# Patient Record
Sex: Female | Born: 1960 | Race: White | Hispanic: Yes | Marital: Married | Smoking: Never smoker
Health system: Southern US, Community
[De-identification: ages and names within clinical notes are randomized; demographics above are authoritative.]

## PROBLEM LIST (undated history)

## (undated) DIAGNOSIS — J069 Acute upper respiratory infection, unspecified: Secondary | ICD-10-CM

## (undated) DIAGNOSIS — F419 Anxiety disorder, unspecified: Secondary | ICD-10-CM

## (undated) DIAGNOSIS — K219 Gastro-esophageal reflux disease without esophagitis: Secondary | ICD-10-CM

## (undated) DIAGNOSIS — K6389 Other specified diseases of intestine: Secondary | ICD-10-CM

## (undated) DIAGNOSIS — F32A Depression, unspecified: Secondary | ICD-10-CM

## (undated) DIAGNOSIS — Z9221 Personal history of antineoplastic chemotherapy: Secondary | ICD-10-CM

## (undated) DIAGNOSIS — C50919 Malignant neoplasm of unspecified site of unspecified female breast: Secondary | ICD-10-CM

## (undated) DIAGNOSIS — F329 Major depressive disorder, single episode, unspecified: Secondary | ICD-10-CM

## (undated) DIAGNOSIS — Z1371 Encounter for nonprocreative screening for genetic disease carrier status: Secondary | ICD-10-CM

## (undated) HISTORY — DX: Other specified diseases of intestine: K63.89

## (undated) HISTORY — DX: Encounter for nonprocreative screening for genetic disease carrier status: Z13.71

## (undated) HISTORY — DX: Malignant neoplasm of unspecified site of unspecified female breast: C50.919

## (undated) HISTORY — PX: APPENDECTOMY: SHX54

---

## 2002-06-27 ENCOUNTER — Encounter: Payer: Self-pay | Admitting: Family Medicine

## 2002-06-27 ENCOUNTER — Ambulatory Visit (HOSPITAL_COMMUNITY): Admission: RE | Admit: 2002-06-27 | Discharge: 2002-06-27 | Payer: Self-pay | Admitting: Family Medicine

## 2002-08-12 ENCOUNTER — Encounter: Payer: Self-pay | Admitting: Family Medicine

## 2002-08-12 ENCOUNTER — Ambulatory Visit (HOSPITAL_COMMUNITY): Admission: RE | Admit: 2002-08-12 | Discharge: 2002-08-12 | Payer: Self-pay | Admitting: Family Medicine

## 2003-06-29 ENCOUNTER — Ambulatory Visit (HOSPITAL_COMMUNITY): Admission: RE | Admit: 2003-06-29 | Discharge: 2003-06-29 | Payer: Self-pay | Admitting: Family Medicine

## 2003-10-10 ENCOUNTER — Other Ambulatory Visit: Admission: RE | Admit: 2003-10-10 | Discharge: 2003-10-10 | Payer: Self-pay | Admitting: Family Medicine

## 2004-06-16 HISTORY — PX: TUBAL LIGATION: SHX77

## 2004-06-16 HISTORY — PX: ENDOMETRIAL ABLATION: SHX621

## 2004-07-19 ENCOUNTER — Ambulatory Visit (HOSPITAL_COMMUNITY): Admission: RE | Admit: 2004-07-19 | Discharge: 2004-07-19 | Payer: Self-pay | Admitting: Family Medicine

## 2004-10-24 ENCOUNTER — Ambulatory Visit (HOSPITAL_COMMUNITY): Admission: RE | Admit: 2004-10-24 | Discharge: 2004-10-24 | Payer: Self-pay | Admitting: Family Medicine

## 2004-10-30 ENCOUNTER — Other Ambulatory Visit: Admission: RE | Admit: 2004-10-30 | Discharge: 2004-10-30 | Payer: Self-pay | Admitting: Family Medicine

## 2005-07-28 ENCOUNTER — Ambulatory Visit (HOSPITAL_COMMUNITY): Admission: RE | Admit: 2005-07-28 | Discharge: 2005-07-28 | Payer: Self-pay | Admitting: Family Medicine

## 2005-12-08 ENCOUNTER — Other Ambulatory Visit: Admission: RE | Admit: 2005-12-08 | Discharge: 2005-12-08 | Payer: Self-pay | Admitting: Family Medicine

## 2006-07-01 ENCOUNTER — Ambulatory Visit (HOSPITAL_COMMUNITY): Admission: RE | Admit: 2006-07-01 | Discharge: 2006-07-01 | Payer: Self-pay | Admitting: Obstetrics and Gynecology

## 2007-09-16 ENCOUNTER — Other Ambulatory Visit: Admission: RE | Admit: 2007-09-16 | Discharge: 2007-09-16 | Payer: Self-pay | Admitting: Family Medicine

## 2008-07-06 ENCOUNTER — Ambulatory Visit (HOSPITAL_COMMUNITY): Admission: RE | Admit: 2008-07-06 | Discharge: 2008-07-06 | Payer: Self-pay | Admitting: Family Medicine

## 2009-03-07 ENCOUNTER — Other Ambulatory Visit: Admission: RE | Admit: 2009-03-07 | Discharge: 2009-03-07 | Payer: Self-pay | Admitting: Family Medicine

## 2010-07-07 ENCOUNTER — Encounter: Payer: Self-pay | Admitting: Family Medicine

## 2010-11-01 NOTE — H&P (Signed)
[name], ALESHIRE             ACCOUNT NO.:  [id number]   MEDICAL RECORD NO.:  [id number]          PATIENT TYPE:  AMB   LOCATION:  SDC                           FACILITY:  [facility]   PHYSICIAN:  Charles A. Delcambre, MDDATE OF BIRTH:  07/18/1960   DATE OF ADMISSION:  DATE OF DISCHARGE:                              HISTORY & PHYSICAL   HISTORY OF PRESENT ILLNESS:  She is a 50 year old, gravida 2, para 2-0-0-  2, and since 06/10/06 bleeding.  Patient says she has intermenstrual  bleeding one or two times per month as well.  She has undergone a  sonohysterogram on 01/08/05 which showed no evidence of uterine  abnormalities or filling defects or wall defects.  She has continued  with bleeding and some pelvic pain, centrally located, pain with  intercourse, dyspareunia, and post-coital spotting.  She wishes to  undergo a diagnostic laparoscopy as well.  She also has undesired  fertility and wishes to proceed with tubal ligation at the time of  surgery.  She gives informed consent and accepts the risks of infection,  bleeding, bowel or bladder damage, risks including hepatitis and HIV  exposure, ureteral damage, failure rate of approximately 1 in 400 of the  tubal ligation, perforation of the uterus, failure, prolapsing versus  outright failure. Therapeutically, we expect up to 30% chance of  amenorrhea, about 80% to 90% chance of eumenorrhea, about 10% failure  rate roughly, and she accepts all these risks as outlined.  She actually  reviewed literature.   PAST MEDICAL HISTORY:  Asthma in the past, not presently a problem.   PAST SURGICAL HISTORY:  Spontaneous vaginal delivery x two.   MEDICATIONS:  1. Advair p.r.n.  2. Wellbutrin 300 mg daily.  She did not specify why she takes this      medication.   ALLERGIES:  No known drug allergies.   SOCIAL HISTORY:  No tobacco, ethanol or drug use.  No STD exposure in  the past.  She is monogamous with her husband.   FAMILY HISTORY:   Father 50 with hypertension and congestive heart  failure.  Mother 51, no major illnesses.  Sister with heart problems,  another sister in good health.  Another sibling not specified. Mental  health in her brother and another sister in good health.  Otherwise, no  history of breast CA, cancer of cervix or colon cancer in the family,  stroke or diabetes.   REVIEW OF SYSTEMS:  No fever or chills, rashes, lesions or skin lesions,  headache or dizziness, chest pain, shortness of breath, wheezing.  She  does have some seasonal allergies.  No diarrhea or constipation,  bleeding, urgency, frequency or dysuria.  No galactorrhea or menstrual  changes.   PHYSICAL EXAMINATION:  GENERAL:  Alert, oriented times three, in no  distress.  VITAL SIGNS: Weight 132 pounds.  Respirations 16.  Heart rate 80.  Blood  pressure 118/70.  She is afebrile.  HEENT:  Grossly within normal limits.  LUNGS:  Lungs clear bilaterally.  HEART:  Regular rate and rhythm without murmur, rub or gallop.  BREASTS:  No masses and  no skin changes bilaterally.  Breasts are  symmetrical.  Otherwise replace my exam with PCP.  Dr. Casandra Doffing  performed a mammogram as per notes.  ABDOMEN: The abdomen is soft, flat, nontender, no hepatosplenomegaly, no  masses noted.  PELVIC:  Normal external female genitalia.  Bartholin's, urethra and  Skene's glands within normal limits.  Vulva without discharge or  lesions.  Multiparous cervix.  Sound to 8 cm.  Lidocaine 1% was given,  and endometrial biopsy was done.  A small amount of tissue was aspirated  in the past.  Bimanual examination:  Uterus not enlarged.  Adnexa  nontender without masses bilaterally.  Ovaries normal-sized bilaterally.   ASSESSMENT:  1. Abnormal uterine bleeding, endometrial biopsy.  2. Pelvic pain.  3. Desires sterilization.   PLAN:  Diagnostic laparoscopy with carbon dioxide laser standby.  Tubal  ligation and fulguration are scheduled for 07/01/06.  She gives  informed  consent, as noted above.  Preoperatively we will obtain a CBC and a  serum pregnancy test.  She has been previously counseled regarding the  common risks of her surgery.  Surgery is scheduled for approximately 1  p.m.  All questions were answered and will proceed as outlined.      Charles A. Sydnee Cabal, MD  Electronically Signed     CAD/MEDQ  D:  06/18/2006  T:  06/18/2006  Job:  [id number]

## 2010-11-01 NOTE — Op Note (Signed)
[name], [name]             ACCOUNT NO.:  [id number]   MEDICAL RECORD NO.:  [id number]          PATIENT TYPE:  AMB   LOCATION:  SDC                           [facility]ILITY:  [facility]   PHYSICIAN:  Charles A. Delcambre, MDDATE OF BIRTH:  1961-02-09   DATE OF PROCEDURE:  07/01/2006  DATE OF DISCHARGE:                               OPERATIVE REPORT   PREOPERATIVE DIAGNOSIS:  1. Abnormal uterine bleeding, 626.4.  2. Pelvic pain, 625.9.  3. Undesired fertility.   POSTOPERATIVE DIAGNOSIS:  1. Abnormal uterine bleeding, 626.4.  2. Pelvic pain, 625.9.  3. Undesired fertility.   PROCEDURE:  1. Diagnostic laparoscopy.  2. ThermaChoice endometrial ablation.  3. Bilateral tubal ligation with bipolar cautery.   SURGEON:  Charles A. Sydnee Cabal, M.D.   ASSISTANT:  None.   COMPLICATIONS:  None.   ESTIMATED BLOOD LOSS:  Less than 5 mL.   ANESTHESIA:  General by the endotracheal route.   OPERATIVE FINDINGS:  Normal pelvis.  Normal upper abdomen grossly.  Normal ovaries.  Normal posterior cul-de-sac.  No evidence of  endometriosis.   COUNTS:  Instrument, sponge, and needle counts were correct x2.   DESCRIPTION OF PROCEDURE:  The patient was taken to the operating room  and placed in the supine position.  General endotracheal anesthesia was  induced without difficulty.  She was then placed in the dorsal lithotomy  position  in the universal stirrups and a sterile prep and drape was  undertaken.  A Cohen cannula with a single tooth tenaculum was placed on  the cervix. Attention was turned to the abdomen.  0.25% plain Marcaine  was placed at the umbilicus and suprapubic port sites.  A 10 mm incision  was made at the umbilicus.  The Veress needle was placed with anterior  traction on the abdominal wall.  Aspiration, injection, reaspiration,  hanging drop tests, and insufflation less than 10 mmHg at 2 liters CO2  insufflation per minute, all indicated intraperitoneal location.  Adequate  pneumoperitoneum was obtained at 2 liters insufflation.  The  Veress needle was removed.  A 10 mm trocar was placed with anterior  traction on the abdominal wall.  Once again, immediate placement of the  scope yielded verification of intraperitoneal location without damage to  bowel, bladder, or vascular structures.  In a stab incision 2 cm  fingerbreadths above the symphysis pubis in the midline, a separate 5 mm  port was placed under direct visualization.  No damage to bowel,  bladder, or vascular structure was noted.   Pelvis exploration was undertaken with findings as noted above.  Bipolar  cautery was used to cauterize an approximately 3 cm segment of the  fallopian tubes on either side.  This was done without difficulty and  without damage to the surrounding structures.  Desufflation was allowed to occur.  Low pressure hemostasis was  verified.  Desufflation was allowed to recur completely.  The peritoneal  edges were visualized and good hemostasis.  The peritoneal edge was  visualized upon withdrawal of the umbilical port.  The fascia at the  umbilical trocar site was closed with a single 0  Vicryl interrupted  suture.  Subcuticular 3-0 Monocryl x2 stitches were used to close the  umbilical port skin.  A sterile dressing was applied.  Dermabond was  used to close the suprapubic site without difficulty.   Attention was turned to the ablation procedure.  The Cohen cannula was  removed.  A weighted speculum was placed in the vaginal vault and the  cervix was adequately seen.  The sound was to 8 cm.  There was no  evidence of perforation during the procedure.  ThermaChoice was carried  out with 87 degrees Centigrade for 8 minutes therapy cycle without  difficulty.  There were no complications during the procedure done per  protocol.  The instruments were removed.  Hemostasis was excellent.  The  patient was awakened and taken to the recovery room with the physician  in attendance,  having tolerated the procedure well.      Charles A. Sydnee Cabal, MD  Electronically Signed     CAD/MEDQ  D:  07/01/2006  T:  07/01/2006  Job:  [id number]

## 2011-07-04 ENCOUNTER — Encounter (INDEPENDENT_AMBULATORY_CARE_PROVIDER_SITE_OTHER): Payer: Self-pay | Admitting: General Surgery

## 2011-07-04 ENCOUNTER — Ambulatory Visit (INDEPENDENT_AMBULATORY_CARE_PROVIDER_SITE_OTHER): Payer: 59 | Admitting: General Surgery

## 2011-07-04 VITALS — BP 125/85 | HR 60 | Temp 98.2°F | Resp 24 | Ht 62.0 in | Wt 140.0 lb

## 2011-07-04 DIAGNOSIS — K6389 Other specified diseases of intestine: Secondary | ICD-10-CM

## 2011-07-04 NOTE — Progress Notes (Signed)
Patient ID: [name], female   DOB: 07-08-1960, 51 y.o.   MRN: [id number]  Chief Complaint  Patient presents with  . Colon Polyps    HPI [name] is a 51 y.o. female.  HPI  This patient was referred by Dr. Dulce Sellar for evaluation of a newly diagnosed right colon mass found on initial screening colonoscopy. 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 The lesion was tattooed but not removed due to the size. She denies any melena but has been unsure of any blood in her stools because she states that she is perimenopausal and is not sure if she's had uterine blood or rectal bleeding. She denies any abnormal weight loss or change in her bowels. She denies any family history of cancers other than the colon cancer in her great grandmother. She denies any abdominal pain.  PMH: Depression, anxiety  PSH: tubal ligation  FH: Colon cancer in great [place], otherwise negative  Social History History  Substance Use Topics  . Smoking status: Never Smoker   . Smokeless tobacco: Not on file  . Alcohol Use: Yes    No Known Allergies  Current Outpatient Prescriptions  Medication Sig Dispense Refill  . amphetamine-dextroamphetamine (ADDERALL) 30 MG tablet Take 30 mg by mouth daily.      . ARIPiprazole (ABILIFY) 5 MG tablet Take 2.5 mg by mouth daily.      Marland Kitchen buPROPion (WELLBUTRIN) 75 MG tablet Take 150 mg by mouth daily.      . clonazePAM (KLONOPIN) 0.5 MG tablet Take 0.5 mg by mouth as needed.      . darifenacin (ENABLEX) 7.5 MG 24 hr tablet Take 15 mg by mouth daily.        Review of Systems Review of Systems All other review of systems negative or noncontributory except as stated in the HPI  Blood pressure 125/85, pulse 60, temperature 98.2 F (36.8 C), temperature source Temporal, resp. rate 24, height 5\' 2"  (1.575 m), weight 140 lb (63.504 kg).  Physical Exam Physical Exam Physical Exam  Vitals  reviewed. Constitutional: He is oriented to person, place, and time. He appears well-developed and well-nourished. No distress.  HENT:  Head: Normocephalic and atraumatic.  Mouth/Throat: No oropharyngeal exudate.  Eyes: Conjunctivae and EOM are normal. Pupils are equal, round, and reactive to light. Right eye exhibits no discharge. Left eye exhibits no discharge. No scleral icterus.  Neck: Normal range of motion. No tracheal deviation present.  Cardiovascular: Normal rate, regular rhythm and normal heart sounds.   Pulmonary/Chest: Effort normal and breath sounds normal. No stridor. No respiratory distress. He has no wheezes. He has no rales. He exhibits no tenderness.  Abdominal: Soft. Bowel sounds are normal. He exhibits no distension and no mass. There is no tenderness. There is no rebound and no guarding.  Musculoskeletal: Normal range of motion. He exhibits no edema and no tenderness.  Neurological: He is alert and oriented to person, place, and time.  Skin: Skin is warm and dry. No rash noted. He is not diaphoretic. No erythema. No pallor.  Psychiatric: He has a normal mood and affect. His behavior is normal. Judgment and thought content normal.    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 I have reviewed the endoscopy report and pictures as well as the pathology and I agree that right hemicolectomy should be recommended for further diagnosis and  treatment of this right colon mass. I had a long discussion with the patient regarding the biopsy results and the recommended procedures and we have decided to proceed with laparoscopic right hemicolectomy. I explained the risks of surgery including infection, bleeding, pain, scarring, hernia, recurrence, need for open surgery, anastomotic leaks, injury to the ureter and possible need for ostomy. She expressed understanding and desired to proceed with right  hemicolectomy. The lesion has been tattooed and should be easily accessible.    Plan    We will schedule right hemicolectomy at next available apartment. I explained to her the preoperative preparation of clear liquid diet for 2 days prior to her surgery but I do not require formal bowel prep. She depressed understanding of the preoperative preparation. We will also obtain a CEA level as part of her preoperative workup.       Lodema Pilot DAVID 07/04/2011, 10:31 AM

## 2011-07-04 NOTE — Patient Instructions (Signed)
Do not take any Aspirin, Advil or Aleve 5 days preop

## 2011-07-07 ENCOUNTER — Ambulatory Visit
Admission: RE | Admit: 2011-07-07 | Discharge: 2011-07-07 | Disposition: A | Discharge status: Home / Self Care | Payer: 59 | Source: Ambulatory Visit | Attending: General Surgery | Admitting: General Surgery

## 2011-07-07 DIAGNOSIS — K6389 Other specified diseases of intestine: Secondary | ICD-10-CM

## 2011-07-07 MED ORDER — IOHEXOL 300 MG/ML  SOLN
100.0000 mL | Freq: Once | INTRAMUSCULAR | Status: AC | PRN
  Administered 2011-07-07: 100 mL via INTRAVENOUS

## 2011-07-10 ENCOUNTER — Encounter (INDEPENDENT_AMBULATORY_CARE_PROVIDER_SITE_OTHER): Payer: Self-pay

## 2011-07-11 ENCOUNTER — Telehealth (INDEPENDENT_AMBULATORY_CARE_PROVIDER_SITE_OTHER): Payer: Self-pay | Admitting: General Surgery

## 2011-07-14 ENCOUNTER — Encounter (HOSPITAL_COMMUNITY): Payer: Self-pay | Admitting: Pharmacy Technician

## 2011-07-17 ENCOUNTER — Telehealth (INDEPENDENT_AMBULATORY_CARE_PROVIDER_SITE_OTHER): Payer: Self-pay | Admitting: General Surgery

## 2011-07-17 NOTE — Telephone Encounter (Signed)
Patient aware that her surgery should not be r/s or cxl'd at this time due to a cold, will reassess closer to her surgery date if she is still sick with a cold at that time.

## 2011-07-21 NOTE — Pre-Procedure Instructions (Addendum)
20 EVELENE ROUSSIN  07/21/2011   Your procedure is scheduled [id number] 07/28/11    Report to [facility] at 530 AM.  Call this number if you have problems the morning of surgery: [phone]   Remember:   Do not eat food:After Midnight.     (CLEAR LIQUID DIET 2 DAYS PRIOR TO PROCEDURE)  May have clear liquids: up to 4 Hours before arrival.  Clear liquids include soda, tea, black coffee, apple or grape juice, broth.  Take these medicines the morning of surgery with A SIP OF WATER: ADDERALL,WELLBUTRIN,KLONOPIN,ENABLEX   Do not wear jewelry, make-up or nail polish.  Do not wear lotions, powders, or perfumes. You may wear deodorant.  Do not shave 48 hours prior to surgery.  Do not bring valuables to the hospital.  Contacts, dentures or bridgework may not be worn into surgery.  Leave suitcase in the car. After surgery it may be brought to your room.  For patients admitted to the hospital, checkout time is 11:00 AM the day of discharge.   Patients discharged the day of surgery will not be allowed to drive home.  Name and phone number of your driver:   HARALD Knotek -- SPOUSE  Special Instructions: CHG Shower Use Special Wash: 1/2 bottle night before surgery and 1/2 bottle morning of surgery.   Please read over the following fact sheets that you were given: Pain Booklet, MRSA Information and Surgical Site Infection Prevention

## 2011-07-22 ENCOUNTER — Encounter (HOSPITAL_COMMUNITY): Payer: Self-pay

## 2011-07-22 ENCOUNTER — Encounter (HOSPITAL_COMMUNITY)
Admission: RE | Admit: 2011-07-22 | Discharge: 2011-07-22 | Disposition: A | Discharge status: Home / Self Care | Payer: 59 | Source: Ambulatory Visit | Attending: Anesthesiology | Admitting: Anesthesiology

## 2011-07-22 ENCOUNTER — Encounter (HOSPITAL_COMMUNITY)
Admission: RE | Admit: 2011-07-22 | Discharge: 2011-07-22 | Disposition: A | Discharge status: Home / Self Care | Payer: 59 | Source: Ambulatory Visit | Attending: General Surgery | Admitting: General Surgery

## 2011-07-22 DIAGNOSIS — K6389 Other specified diseases of intestine: Secondary | ICD-10-CM

## 2011-07-22 HISTORY — DX: Anxiety disorder, unspecified: F41.9

## 2011-07-22 HISTORY — DX: Major depressive disorder, single episode, unspecified: F32.9

## 2011-07-22 HISTORY — DX: Acute upper respiratory infection, unspecified: J06.9

## 2011-07-22 HISTORY — DX: Depression, unspecified: F32.A

## 2011-07-22 HISTORY — DX: Gastro-esophageal reflux disease without esophagitis: K21.9

## 2011-07-22 LAB — CBC
HCT: 39.3 % (ref 36.0–46.0)
Hemoglobin: 13.5 g/dL (ref 12.0–15.0)
MCHC: 34.4 g/dL (ref 30.0–36.0)
Platelets: 324 10*3/uL (ref 150–400)
RDW: 13.3 % (ref 11.5–15.5)

## 2011-07-22 LAB — COMPREHENSIVE METABOLIC PANEL
ALT: 14 U/L (ref 0–35)
AST: 14 U/L (ref 0–37)
Albumin: 4 g/dL (ref 3.5–5.2)
BUN: 12 mg/dL (ref 6–23)
Calcium: 10.2 mg/dL (ref 8.4–10.5)
Creatinine, Ser: 0.71 mg/dL (ref 0.50–1.10)
GFR calc Af Amer: >90 mL/min (ref >90)
GFR calc non Af Amer: >90 mL/min (ref >90)
Glucose, Bld: 96 mg/dL (ref 70–99)
Potassium: 4.5 mEq/L (ref 3.5–5.1)
Total Bilirubin: 0.3 mg/dL (ref 0.3–1.2)
Total Protein: 7.9 g/dL (ref 6.0–8.3)

## 2011-07-22 LAB — DIFFERENTIAL
Basophils Absolute: 0.1 10*3/uL (ref 0.0–0.1)
Basophils Relative: 1 % (ref 0–1)
Eosinophils Absolute: 0.2 10*3/uL (ref 0.0–0.7)
Eosinophils Relative: 2 % (ref 0–5)
Monocytes Absolute: 0.6 10*3/uL (ref 0.1–1.0)

## 2011-07-22 NOTE — Progress Notes (Addendum)
2/5   PT HAD NO QUESTIONS FOR ANESTHESIA.Marland KitchenMarland KitchenSO CONSULT DEFERRED. ON HX FORM....SHE ONLY HAD CERVICAL ABLATION... NOT FOR CONDYLOMAS  HAVE CALLED DR LAYTON'S OFFICE TO SEE IF HE WANTS ENTEREG GIVEN DOS.....Marland KitchenLEFT MESSAGE FOR CHRISTY........0845 AM Tuesday)  Wednesday AFTERNOON........ NO ENTEREG NEEDS TO BE ORDERED PER CHRISTI @ [facility]...........Marland Kitchen

## 2011-07-23 ENCOUNTER — Telehealth (INDEPENDENT_AMBULATORY_CARE_PROVIDER_SITE_OTHER): Payer: Self-pay

## 2011-07-23 NOTE — Telephone Encounter (Signed)
Pre-Op called with regards to Entereg, paged Dr. Biagio Quint to make changes to Epic orders accordingly.

## 2011-07-27 MED ORDER — ENOXAPARIN SODIUM 40 MG/0.4ML ~~LOC~~ SOLN
40.0000 mg | Freq: Once | SUBCUTANEOUS | Status: AC
  Administered 2011-07-28: 40 mg via SUBCUTANEOUS
  Filled 2011-07-27: qty 0.4

## 2011-07-27 MED ORDER — SODIUM CHLORIDE 0.9 % IV SOLN
1.0000 g | INTRAVENOUS | Status: AC
  Administered 2011-07-28: 1 g via INTRAVENOUS
  Filled 2011-07-27: qty 1

## 2011-07-28 ENCOUNTER — Inpatient Hospital Stay (HOSPITAL_COMMUNITY)
Admission: RE | Admit: 2011-07-28 | Discharge: 2011-08-02 | DRG: 331 | Disposition: A | Discharge status: Home / Self Care | Payer: 59 | Source: Ambulatory Visit | Attending: General Surgery | Admitting: General Surgery

## 2011-07-28 ENCOUNTER — Encounter (HOSPITAL_COMMUNITY): Payer: Self-pay | Admitting: Anesthesiology

## 2011-07-28 ENCOUNTER — Encounter (HOSPITAL_COMMUNITY)
Admission: RE | Disposition: A | Discharge status: Home / Self Care | Payer: Self-pay | Source: Ambulatory Visit | Attending: General Surgery

## 2011-07-28 ENCOUNTER — Encounter (HOSPITAL_COMMUNITY): Payer: Self-pay | Admitting: *Deleted

## 2011-07-28 ENCOUNTER — Ambulatory Visit (HOSPITAL_COMMUNITY): Payer: 59 | Admitting: Anesthesiology

## 2011-07-28 ENCOUNTER — Other Ambulatory Visit (INDEPENDENT_AMBULATORY_CARE_PROVIDER_SITE_OTHER): Payer: Self-pay | Admitting: General Surgery

## 2011-07-28 DIAGNOSIS — D126 Benign neoplasm of colon, unspecified: Secondary | ICD-10-CM

## 2011-07-28 DIAGNOSIS — K6389 Other specified diseases of intestine: Secondary | ICD-10-CM

## 2011-07-28 DIAGNOSIS — Z9049 Acquired absence of other specified parts of digestive tract: Secondary | ICD-10-CM

## 2011-07-28 DIAGNOSIS — Z01812 Encounter for preprocedural laboratory examination: Secondary | ICD-10-CM

## 2011-07-28 DIAGNOSIS — Z7982 Long term (current) use of aspirin: Secondary | ICD-10-CM

## 2011-07-28 DIAGNOSIS — Z800 Family history of malignant neoplasm of digestive organs: Secondary | ICD-10-CM

## 2011-07-28 DIAGNOSIS — K219 Gastro-esophageal reflux disease without esophagitis: Secondary | ICD-10-CM | POA: Diagnosis present

## 2011-07-28 HISTORY — PX: HEMICOLECTOMY: SHX854

## 2011-07-28 HISTORY — DX: Other specified diseases of intestine: K63.89

## 2011-07-28 LAB — TYPE AND SCREEN
ABO/RH(D): O POS
Antibody Screen: NEGATIVE

## 2011-07-28 LAB — HCG, SERUM, QUALITATIVE: Preg, Serum: NEGATIVE

## 2011-07-28 LAB — ABO/RH: ABO/RH(D): O POS

## 2011-07-28 SURGERY — LAPAROSCOPIC PARTIAL COLECTOMY
Anesthesia: General | Site: Abdomen | Laterality: Right | Wound class: Clean Contaminated

## 2011-07-28 MED ORDER — MEPERIDINE HCL 25 MG/ML IJ SOLN: 6.2500 mg | INTRAMUSCULAR | Status: DC | PRN

## 2011-07-28 MED ORDER — ARIPIPRAZOLE 5 MG PO TABS
2.5000 mg | ORAL_TABLET | Freq: Every day | ORAL | Status: DC
  Administered 2011-07-28 – 2011-08-02 (×6): 2.5 mg via ORAL
  Filled 2011-07-28 (×6): qty 1

## 2011-07-28 MED ORDER — DIPHENHYDRAMINE HCL 12.5 MG/5ML PO ELIX
12.5000 mg | ORAL_SOLUTION | Freq: Four times a day (QID) | ORAL | Status: DC | PRN
  Filled 2011-07-28: qty 5

## 2011-07-28 MED ORDER — DIPHENHYDRAMINE HCL 50 MG/ML IJ SOLN
12.5000 mg | Freq: Four times a day (QID) | INTRAMUSCULAR | Status: DC | PRN
  Administered 2011-07-29: 12.5 mg via INTRAVENOUS
  Filled 2011-07-28: qty 1

## 2011-07-28 MED ORDER — ADULT MULTIVITAMIN W/MINERALS CH
1.0000 | ORAL_TABLET | Freq: Every day | ORAL | Status: DC
  Administered 2011-07-29 – 2011-08-02 (×5): 1 via ORAL
  Filled 2011-07-28 (×5): qty 1

## 2011-07-28 MED ORDER — ERTAPENEM SODIUM 1 G IJ SOLR
1.0000 g | Freq: Once | INTRAMUSCULAR | Status: AC
  Administered 2011-07-29: 1 g via INTRAVENOUS
  Filled 2011-07-28: qty 1

## 2011-07-28 MED ORDER — NEOSTIGMINE METHYLSULFATE 1 MG/ML IJ SOLN
INTRAMUSCULAR | Status: DC | PRN
  Administered 2011-07-28: 3 mg via INTRAVENOUS

## 2011-07-28 MED ORDER — BUPIVACAINE-EPINEPHRINE 0.25% -1:200000 IJ SOLN
INTRAMUSCULAR | Status: DC | PRN
  Administered 2011-07-28: 30 mL

## 2011-07-28 MED ORDER — ONDANSETRON HCL 4 MG/2ML IJ SOLN
INTRAMUSCULAR | Status: DC | PRN
  Administered 2011-07-28: 4 mg via INTRAVENOUS

## 2011-07-28 MED ORDER — DEXAMETHASONE SODIUM PHOSPHATE 4 MG/ML IJ SOLN
INTRAMUSCULAR | Status: DC | PRN
  Administered 2011-07-28: 4 mg via INTRAVENOUS

## 2011-07-28 MED ORDER — HYDROMORPHONE HCL PF 1 MG/ML IJ SOLN
INTRAMUSCULAR | Status: AC
  Filled 2011-07-28: qty 1

## 2011-07-28 MED ORDER — SODIUM CHLORIDE 0.9 % IJ SOLN: 9.0000 mL | INTRAMUSCULAR | Status: DC | PRN

## 2011-07-28 MED ORDER — MORPHINE SULFATE (PF) 1 MG/ML IV SOLN
INTRAVENOUS | Status: DC
  Administered 2011-07-28: 1 mg via INTRAVENOUS
  Filled 2011-07-28: qty 25

## 2011-07-28 MED ORDER — ROCURONIUM BROMIDE 100 MG/10ML IV SOLN
INTRAVENOUS | Status: DC | PRN
  Administered 2011-07-28: 50 mg via INTRAVENOUS

## 2011-07-28 MED ORDER — HYDROCODONE-ACETAMINOPHEN 5-325 MG PO TABS
1.0000 | ORAL_TABLET | ORAL | Status: DC | PRN
  Administered 2011-07-29 – 2011-07-31 (×7): 2 via ORAL
  Filled 2011-07-28: qty 1
  Filled 2011-07-28 (×3): qty 2
  Filled 2011-07-28: qty 1
  Filled 2011-07-28 (×3): qty 2

## 2011-07-28 MED ORDER — DIPHENHYDRAMINE HCL 50 MG/ML IJ SOLN
INTRAMUSCULAR | Status: DC | PRN
  Administered 2011-07-28: 50 mg via INTRAVENOUS

## 2011-07-28 MED ORDER — BUPROPION HCL 75 MG PO TABS
150.0000 mg | ORAL_TABLET | Freq: Every day | ORAL | Status: DC
  Administered 2011-07-29 – 2011-08-02 (×5): 150 mg via ORAL
  Filled 2011-07-28 (×5): qty 2

## 2011-07-28 MED ORDER — CLONAZEPAM 0.5 MG PO TABS: 0.5000 mg | ORAL_TABLET | Freq: Three times a day (TID) | ORAL | Status: DC | PRN

## 2011-07-28 MED ORDER — FENTANYL CITRATE 0.05 MG/ML IJ SOLN
INTRAMUSCULAR | Status: DC | PRN
  Administered 2011-07-28: 100 ug via INTRAVENOUS
  Administered 2011-07-28: 50 ug via INTRAVENOUS
  Administered 2011-07-28: 100 ug via INTRAVENOUS

## 2011-07-28 MED ORDER — MORPHINE SULFATE (PF) 1 MG/ML IV SOLN
INTRAVENOUS | Status: DC
  Administered 2011-07-28: 21:00:00 via INTRAVENOUS
  Administered 2011-07-28: 8 mg via INTRAVENOUS
  Administered 2011-07-28: 4 mg via INTRAVENOUS
  Administered 2011-07-28: 8 mg via INTRAVENOUS
  Administered 2011-07-29: 10 mg via INTRAVENOUS
  Administered 2011-07-29 (×2): 9 mg via INTRAVENOUS
  Administered 2011-07-29: 3.5 mg via INTRAVENOUS
  Administered 2011-07-29: 11 mg via INTRAVENOUS
  Administered 2011-07-29: 08:00:00 via INTRAVENOUS
  Filled 2011-07-28 (×3): qty 25

## 2011-07-28 MED ORDER — PROPOFOL 10 MG/ML IV EMUL
INTRAVENOUS | Status: DC | PRN
  Administered 2011-07-28: 150 mg via INTRAVENOUS

## 2011-07-28 MED ORDER — 0.9 % SODIUM CHLORIDE (POUR BTL) OPTIME
TOPICAL | Status: DC | PRN
  Administered 2011-07-28: 1000 mL

## 2011-07-28 MED ORDER — KCL IN DEXTROSE-NACL 20-5-0.45 MEQ/L-%-% IV SOLN
INTRAVENOUS | Status: DC
  Administered 2011-07-28 – 2011-07-29 (×3): via INTRAVENOUS
  Administered 2011-07-29: 100 mL/h via INTRAVENOUS
  Filled 2011-07-28 (×6): qty 1000

## 2011-07-28 MED ORDER — ENOXAPARIN SODIUM 40 MG/0.4ML ~~LOC~~ SOLN
40.0000 mg | SUBCUTANEOUS | Status: DC
  Administered 2011-07-29 – 2011-08-01 (×4): 40 mg via SUBCUTANEOUS
  Filled 2011-07-28 (×5): qty 0.4

## 2011-07-28 MED ORDER — GLYCOPYRROLATE 0.2 MG/ML IJ SOLN
INTRAMUSCULAR | Status: DC | PRN
  Administered 2011-07-28: .4 mg via INTRAVENOUS

## 2011-07-28 MED ORDER — MIDAZOLAM HCL 5 MG/5ML IJ SOLN
INTRAMUSCULAR | Status: DC | PRN
  Administered 2011-07-28: 2 mg via INTRAVENOUS

## 2011-07-28 MED ORDER — NALOXONE HCL 0.4 MG/ML IJ SOLN: 0.4000 mg | INTRAMUSCULAR | Status: DC | PRN

## 2011-07-28 MED ORDER — SODIUM CHLORIDE 0.9 % IR SOLN
Status: DC | PRN
  Administered 2011-07-28: 1000 mL

## 2011-07-28 MED ORDER — ONDANSETRON HCL 4 MG/2ML IJ SOLN: 4.0000 mg | Freq: Four times a day (QID) | INTRAMUSCULAR | Status: DC | PRN

## 2011-07-28 MED ORDER — LACTATED RINGERS IV SOLN
INTRAVENOUS | Status: DC | PRN
  Administered 2011-07-28 (×2): via INTRAVENOUS

## 2011-07-28 MED ORDER — DARIFENACIN HYDROBROMIDE ER 15 MG PO TB24
15.0000 mg | ORAL_TABLET | Freq: Every day | ORAL | Status: DC
  Administered 2011-07-29 – 2011-08-02 (×5): 15 mg via ORAL
  Filled 2011-07-28 (×5): qty 1

## 2011-07-28 MED ORDER — HYDROMORPHONE HCL PF 1 MG/ML IJ SOLN
0.2500 mg | INTRAMUSCULAR | Status: DC | PRN
  Administered 2011-07-28 (×4): 0.5 mg via INTRAVENOUS

## 2011-07-28 SURGICAL SUPPLY — 76 items
APL SKNCLS STERI-STRIP NONHPOA (GAUZE/BANDAGES/DRESSINGS) ×1
APPLIER CLIP ROT 10 11.4 M/L (STAPLE)
APR CLP MED LRG 11.4X10 (STAPLE)
BENZOIN TINCTURE PRP APPL 2/3 (GAUZE/BANDAGES/DRESSINGS) ×1 IMPLANT
BLADE SURG 10 STRL SS (BLADE) ×2 IMPLANT
BLADE SURG ROTATE 9660 (MISCELLANEOUS) ×1 IMPLANT
CANISTER SUCTION 2500CC (MISCELLANEOUS) ×2 IMPLANT
CELLS DAT CNTRL 66122 CELL SVR (MISCELLANEOUS) ×1 IMPLANT
CHLORAPREP W/TINT 26ML (MISCELLANEOUS) ×2 IMPLANT
CLIP APPLIE ROT 10 11.4 M/L (STAPLE) IMPLANT
COVER SURGICAL LIGHT HANDLE (MISCELLANEOUS) ×2 IMPLANT
DECANTER SPIKE VIAL GLASS SM (MISCELLANEOUS) ×2 IMPLANT
DRAPE PROXIMA HALF (DRAPES) IMPLANT
DRAPE WARM FLUID 44X44 (DRAPE) ×2 IMPLANT
DRSG TEGADERM 4X4.75 (GAUZE/BANDAGES/DRESSINGS) ×1 IMPLANT
ELECT CAUTERY BLADE 6.4 (BLADE) ×3 IMPLANT
ELECT REM PT RETURN 9FT ADLT (ELECTROSURGICAL) ×2
ELECTRODE REM PT RTRN 9FT ADLT (ELECTROSURGICAL) ×1 IMPLANT
ENDOLOOP SUT PDS II  0 18 (SUTURE) ×1
ENDOLOOP SUT PDS II 0 18 (SUTURE) IMPLANT
GAUZE SPONGE 2X2 8PLY STRL LF (GAUZE/BANDAGES/DRESSINGS) IMPLANT
GLOVE BIOGEL PI IND STRL 7.0 (GLOVE) IMPLANT
GLOVE BIOGEL PI INDICATOR 7.0 (GLOVE) ×4
GLOVE ECLIPSE 7.0 STRL STRAW (GLOVE) ×1 IMPLANT
GLOVE SURG SIGNA 7.5 PF LTX (GLOVE) ×1 IMPLANT
GLOVE SURG SS PI 6.5 STRL IVOR (GLOVE) ×3 IMPLANT
GLOVE SURG SS PI 7.5 STRL IVOR (GLOVE) ×5 IMPLANT
GOWN PREVENTION PLUS XLARGE (GOWN DISPOSABLE) ×3 IMPLANT
GOWN STRL NON-REIN LRG LVL3 (GOWN DISPOSABLE) ×7 IMPLANT
KIT BASIN OR (CUSTOM PROCEDURE TRAY) ×2 IMPLANT
LEGGING LITHOTOMY PAIR STRL (DRAPES) IMPLANT
LIGASURE 5MM LAPAROSCOPIC (INSTRUMENTS) ×1 IMPLANT
NS IRRIG 1000ML POUR BTL (IV SOLUTION) ×4 IMPLANT
PAD ARMBOARD 7.5X6 YLW CONV (MISCELLANEOUS) ×4 IMPLANT
PENCIL BUTTON HOLSTER BLD 10FT (ELECTRODE) ×2 IMPLANT
RELOAD PROXIMATE 75MM BLUE (ENDOMECHANICALS) ×4 IMPLANT
RELOAD STAPLE 75 3.8 BLU REG (ENDOMECHANICALS) IMPLANT
RETRACTOR WND ALEXIS 18 MED (MISCELLANEOUS) IMPLANT
RTRCTR WOUND ALEXIS 18CM MED (MISCELLANEOUS) ×2
SCALPEL HARMONIC ACE (MISCELLANEOUS) IMPLANT
SCISSORS LAP 5X35 DISP (ENDOMECHANICALS) IMPLANT
SET IRRIG TUBING LAPAROSCOPIC (IRRIGATION / IRRIGATOR) ×1 IMPLANT
SLEEVE ENDOPATH XCEL 5M (ENDOMECHANICALS) ×2 IMPLANT
SLEEVE SURGEON STRL (DRAPES) ×4 IMPLANT
SPECIMEN JAR LARGE (MISCELLANEOUS) ×1 IMPLANT
SPECIMEN JAR X LARGE (MISCELLANEOUS) ×1 IMPLANT
SPONGE GAUZE 2X2 STER 10/PKG (GAUZE/BANDAGES/DRESSINGS) ×1
SPONGE GAUZE 4X4 12PLY (GAUZE/BANDAGES/DRESSINGS) ×2 IMPLANT
STAPLER GUN LINEAR PROX 60 (STAPLE) ×1 IMPLANT
STAPLER PROXIMATE 75MM BLUE (STAPLE) ×1 IMPLANT
STAPLER VISISTAT 35W (STAPLE) ×2 IMPLANT
SURGILUBE 2OZ TUBE FLIPTOP (MISCELLANEOUS) IMPLANT
SUT MNCRL AB 4-0 PS2 18 (SUTURE) ×2 IMPLANT
SUT PDS AB 1 TP1 96 (SUTURE) ×2 IMPLANT
SUT PDS II 0 TP-1 LOOPED 60 (SUTURE) ×2 IMPLANT
SUT PROLENE 2 0 CT2 30 (SUTURE) IMPLANT
SUT PROLENE 2 0 KS (SUTURE) IMPLANT
SUT SILK 2 0 SH (SUTURE) ×1 IMPLANT
SUT SILK 2 0 SH CR/8 (SUTURE) ×2 IMPLANT
SUT SILK 2 0 TIES 10X30 (SUTURE) ×2 IMPLANT
SUT SILK 3 0 SH CR/8 (SUTURE) ×3 IMPLANT
SUT SILK 3 0 TIES 10X30 (SUTURE) ×2 IMPLANT
SYS LAPSCP GELPORT 120MM (MISCELLANEOUS)
SYSTEM LAPSCP GELPORT 120MM (MISCELLANEOUS) IMPLANT
TOWEL OR 17X24 6PK STRL BLUE (TOWEL DISPOSABLE) ×2 IMPLANT
TOWEL OR 17X26 10 PK STRL BLUE (TOWEL DISPOSABLE) ×2 IMPLANT
TRAY FOLEY CATH 14FRSI W/METER (CATHETERS) ×2 IMPLANT
TRAY LAPAROSCOPIC (CUSTOM PROCEDURE TRAY) ×2 IMPLANT
TRAY PROCTOSCOPIC FIBER OPTIC (SET/KITS/TRAYS/PACK) IMPLANT
TROCAR XCEL BLUNT TIP 100MML (ENDOMECHANICALS) ×1 IMPLANT
TROCAR XCEL NON-BLD 5MMX100MML (ENDOMECHANICALS) ×2 IMPLANT
TROCAR Z-THREAD FIOS 12X100MM (TROCAR) ×1 IMPLANT
TUBE CONNECTING 12X1/4 (SUCTIONS) ×2 IMPLANT
TUBING FILTER THERMOFLATOR (ELECTROSURGICAL) ×2 IMPLANT
WATER STERILE IRR 1000ML POUR (IV SOLUTION) IMPLANT
YANKAUER SUCT BULB TIP NO VENT (SUCTIONS) ×4 IMPLANT

## 2011-07-28 NOTE — Interval H&P Note (Signed)
History and Physical Interval Note:  07/28/2011 7:15 AM  [name]  has presented today for surgery, with the diagnosis of right colon mass  The various methods of treatment have been discussed with the patient and family. After consideration of risks, benefits and other options for treatment, the patient has consented to. We discussed the risks of infection, bleeding, pain, scarring, need for open, anastamotic leaks, and recurrence again discussed and she desires to proceed with lap right hemicolectomy.   Procedure(s): LAPAROSCOPIC PARTIAL COLECTOMY as a surgical intervention .  The patients' history has been reviewed, patient examined, no change in status, stable for surgery.  I have reviewed the patients' chart and labs.  Questions were answered to the patient's satisfaction.     Lodema Pilot DAVID

## 2011-07-28 NOTE — Progress Notes (Signed)
UR of chart completed.  

## 2011-07-28 NOTE — Progress Notes (Signed)
Patient ID: [name], female   DOB: Apr 03, 1961, 51 y.o.   MRN: [id number]   Feels well, no complaints. HD stable.

## 2011-07-28 NOTE — Brief Op Note (Signed)
07/28/2011  9:53 AM  PATIENT:  [name]  51 y.o. female  PRE-OPERATIVE DIAGNOSIS:  RIGHT COLON MASS  POST-OPERATIVE DIAGNOSIS:  RIGHT COLON MASS  PROCEDURE:  Procedure(s): LAPAROSCOPIC PARTIAL COLECTOMY  SURGEON:  Surgeon(s): Rulon Abide, DO Emelia Loron, MD  PHYSICIAN ASSISTANT:   ASSISTANTSDwain Sarna   ANESTHESIA:   general  EBL:  Total I/O In: -  Out: 1745 [Urine:325; Other:1400; Blood:20]  BLOOD ADMINISTERED:none  DRAINS: none   LOCAL MEDICATIONS USED:  MARCAINE     SPECIMEN:  Source of Specimen:  right colon  DISPOSITION OF SPECIMEN:  PATHOLOGY  COUNTS:  YES  TOURNIQUET:  * No tourniquets in log *  DICTATION: .Other Dictation: Dictation Number [id number]  PLAN OF CARE: Admit to inpatient   PATIENT DISPOSITION:  PACU - hemodynamically stable.   Delay start of Pharmacological VTE agent (>24hrs) due to surgical blood loss or risk of bleeding: no

## 2011-07-28 NOTE — H&P (View-Only) (Signed)
Patient ID: [name], female   DOB: 06/02/1961, 51 y.o.   MRN: [id number]  Chief Complaint  Patient presents with  . Colon Polyps    HPI [name] is a 51 y.o. female.  HPI  This patient was referred by Dr. Outlaw for evaluation of a newly diagnosed right colon mass found on initial screening colonoscopy. 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 The lesion was tattooed but not removed due to the size. She denies any melena but has been unsure of any blood in her stools because she states that she is perimenopausal and is not sure if she's had uterine blood or rectal bleeding. She denies any abnormal weight loss or change in her bowels. She denies any family history of cancers other than the colon cancer in her great grandmother. She denies any abdominal pain.  PMH: Depression, anxiety  PSH: tubal ligation  FH: Colon cancer in great [place], otherwise negative  Social History History  Substance Use Topics  . Smoking status: Never Smoker   . Smokeless tobacco: Not on file  . Alcohol Use: Yes    No Known Allergies  Current Outpatient Prescriptions  Medication Sig Dispense Refill  . amphetamine-dextroamphetamine (ADDERALL) 30 MG tablet Take 30 mg by mouth daily.      . ARIPiprazole (ABILIFY) 5 MG tablet Take 2.5 mg by mouth daily.      . buPROPion (WELLBUTRIN) 75 MG tablet Take 150 mg by mouth daily.      . clonazePAM (KLONOPIN) 0.5 MG tablet Take 0.5 mg by mouth as needed.      . darifenacin (ENABLEX) 7.5 MG 24 hr tablet Take 15 mg by mouth daily.        Review of Systems Review of Systems All other review of systems negative or noncontributory except as stated in the HPI  Blood pressure 125/85, pulse 60, temperature 98.2 F (36.8 C), temperature source Temporal, resp. rate 24, height 5' 2" (1.575 m), weight 140 lb (63.504 kg).  Physical Exam Physical Exam Physical Exam  Vitals  reviewed. Constitutional: He is oriented to person, place, and time. He appears well-developed and well-nourished. No distress.  HENT:  Head: Normocephalic and atraumatic.  Mouth/Throat: No oropharyngeal exudate.  Eyes: Conjunctivae and EOM are normal. Pupils are equal, round, and reactive to light. Right eye exhibits no discharge. Left eye exhibits no discharge. No scleral icterus.  Neck: Normal range of motion. No tracheal deviation present.  Cardiovascular: Normal rate, regular rhythm and normal heart sounds.   Pulmonary/Chest: Effort normal and breath sounds normal. No stridor. No respiratory distress. He has no wheezes. He has no rales. He exhibits no tenderness.  Abdominal: Soft. Bowel sounds are normal. He exhibits no distension and no mass. There is no tenderness. There is no rebound and no guarding.  Musculoskeletal: Normal range of motion. He exhibits no edema and no tenderness.  Neurological: He is alert and oriented to person, place, and time.  Skin: Skin is warm and dry. No rash noted. He is not diaphoretic. No erythema. No pallor.  Psychiatric: He has a normal mood and affect. His behavior is normal. Judgment and thought content normal.    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 I have reviewed the endoscopy report and pictures as well as the pathology and I agree that right hemicolectomy should be recommended for further diagnosis and   treatment of this right colon mass. I had a long discussion with the patient regarding the biopsy results and the recommended procedures and we have decided to proceed with laparoscopic right hemicolectomy. I explained the risks of surgery including infection, bleeding, pain, scarring, hernia, recurrence, need for open surgery, anastomotic leaks, injury to the ureter and possible need for ostomy. She expressed understanding and desired to proceed with right  hemicolectomy. The lesion has been tattooed and should be easily accessible.    Plan    We will schedule right hemicolectomy at next available apartment. I explained to her the preoperative preparation of clear liquid diet for 2 days prior to her surgery but I do not require formal bowel prep. She depressed understanding of the preoperative preparation. We will also obtain a CEA level as part of her preoperative workup.       [place] Kuang DAVID 07/04/2011, 10:31 AM    

## 2011-07-28 NOTE — Transfer of Care (Signed)
Immediate Anesthesia Transfer of Care Note  Patient: [name]  Procedure(s) Performed:  LAPAROSCOPIC PARTIAL COLECTOMY - RIGHT LAPAROSCOPIC HEMI COLECTOMY.  Patient Location: PACU  Anesthesia Type: General  Level of Consciousness: awake  Airway & Oxygen Therapy: Patient Spontanous Breathing and Patient connected to nasal cannula oxygen  Post-op Assessment: Report given to PACU RN and Post -op Vital signs reviewed and stable  Post vital signs: Reviewed and stable  Complications: No apparent anesthesia complications2

## 2011-07-28 NOTE — Anesthesia Preprocedure Evaluation (Signed)
Anesthesia Evaluation  Patient identified by MRN, date of birth, ID band Patient awake    Reviewed: Allergy & Precautions, H&P , NPO status   History of Anesthesia Complications Negative for: history of anesthetic complications  Airway Mallampati: I  Neck ROM: Full    Dental  (+) Teeth Intact   Pulmonary  clear to auscultation        Cardiovascular neg cardio ROS Regular Normal    Neuro/Psych    GI/Hepatic Neg liver ROS, GERD-  ,  Endo/Other  Negative Endocrine ROS  Renal/GU negative Renal ROS     Musculoskeletal   Abdominal   Peds  Hematology negative hematology ROS (+)   Anesthesia Other Findings   Reproductive/Obstetrics                           Anesthesia Physical Anesthesia Plan  ASA: I  Anesthesia Plan: General   Post-op Pain Management:    Induction: Intravenous  Airway Management Planned: Oral ETT  Additional Equipment:   Intra-op Plan:   Post-operative Plan: Extubation in OR  Informed Consent: I have reviewed the patients History and Physical, chart, labs and discussed the procedure including the risks, benefits and alternatives for the proposed anesthesia with the patient or authorized representative who has indicated his/her understanding and acceptance.     Plan Discussed with: CRNA and Surgeon  Anesthesia Plan Comments:         Anesthesia Quick Evaluation

## 2011-07-28 NOTE — Op Note (Signed)
NAMEBRITTIN, BELNAP NO.:  [id number]  MEDICAL RECORD NO.:  [id number]  LOCATION:  5123                         FACILITY:  [facility]  PHYSICIAN:  Lodema Pilot, MD       DATE OF BIRTH:  [birth year]  DATE OF PROCEDURE:  07/28/2011 DATE OF DISCHARGE:                              OPERATIVE REPORT   PROCEDURE:  Laparoscopic right hemicolectomy.  PREOPERATIVE DIAGNOSIS:  Right colon mass.  POSTOPERATIVE DIAGNOSIS:  Right colon mass.  SURGEON:  Lodema Pilot, MD  ASSISTANT:  Juanetta Gosling, MD  ANESTHESIA:  General endotracheal anesthesia with 30 mL of 0.25% Marcaine with epinephrine.  FLUIDS:  1300 mL crystalloid.  ESTIMATED BLOOD LOSS:  Minimal.  DRAINS:  None.  SPECIMENS:  Right colon sent to Pathology for permanent section.  COMPLICATIONS:  None apparent.  FINDINGS:  A 3.5 cm right colon mass was sessile, sent to Pathology with the right colon and mesentery side-to-side stapled functional end-to-end anastomosis with the common enterotomy oversewn, and the mesenteric defect was closed.  INDICATION FOR PROCEDURE:  Ms. 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  The lesion was too large for endoscopic removal, and she was referred for surgical resection.  OPERATIVE DETAILS:  Ms. Aspinwall was seen and evaluated in the preoperative area, and risks and benefits of the procedure were again discussed in lay terms.  Informed consent was obtained.  The surgical site was marked prior to anesthetic administration, and she was taken to the operating room.  No bowel prep was prescribed.  Prophylactic antibiotics were given, and she was taken to the operating room, placed on table in supine position.  General endotracheal anesthesia was obtained.  A Foley catheter was placed, and her left arm was tucked at her side.  Supraumbilical midline incision was made in the skin and dissection carried down to the  subcutaneous tissue using blunt dissection.  The abdominal wall fascia was elevated and sharply incised. Peritoneum was entered under direct visualization, and a 12-mm Hasson trocar was placed at the umbilicus.  A 5-mm suprapubic port was placed, and a 12-mm left lower quadrant trocar was placed.  In the left upper quadrant, 5-mm trocar was placed under direct visualization.  She was placed in a right side up position in slight Trendelenburg, and the lesion in the right colon was easily identified with the clearly visible tattoo near the cecum.  The cecum was elevated and ileocolic vascular trunk was identified.  The peritoneum was scored and medial to lateral dissection was performed.  I isolated the ileal colic vascular pedicle near its takeoff from the superior mesenteric vessels, identified the distal superior mesenteric vessels to insure that we were not dividing these proximal.  Then, LigaSure was used to divide the ileocolic trunk, and the space was developed bluntly anterior to the duodenum, again dissecting medial to lateral.  The right colic vessel was divided with the LigaSure, and a window was created through the mesentery near the gallbladder distal to the hepatic flexure.  The right branch of the ileocolic vessel was divided and dissection carried to the mesentery up to the transverse colon, and after the medial dissection was  complete, I mobilized the hepatic flexure using the ligature device and mostly blunt dissection.  Liver was clearly visualized, and there was no evidence of any metastatic disease or liver nodules.  The omental attachments were taken off of the colon in the hepatic flexure, was mobilized medially. The white line of Toldt was dissected and further mobilizing the colon medially.  The ureter was identified and preserved.  The cecum was freely mobile across the midline, and after I was satisfied with my mobilization, I placed a 2-0 silk suture at the point  of distal transection between the bifurcation of the middle colic vessel.  Then, the supraumbilical trocar site was slightly enlarged to accommodate an Alexis wound retractor, and then the colon was eviscerated.  The colon was well mobilized and easily eviscerated.  The proximal division was created approximately 8 cm from the ileocecal valve with a GIA blue load stapler.  The mesentery to the small bowel was divided with a LigaSure. The distal transection site had already been marked with laparoscopic procedure, and the colon was divided at this point with another firing of the GIA 75, 3.5 mm stapler.  The specimen was passed off the table and labeled right colon for permanent sectioning.  Then, the  corner of the staple lines was taken off at each of the small bowel and transverse colon, and a side-to-side functional end-to-end anastomosis was created on the antimesenteric surface of the intestines using a GIA 75, 3.5 mm stapler.  The internal staple line was inspected, noted to be hemostatic, and the common enterotomy was closed with firing of the GIA 60 stapler.  The staple line was oversewn with multiple interrupted 3-0 Lembert silk sutures and crotch stitch was placed with 3-0 silk suture. The mesenteric defect was approximated with a running 2-0 silk suture, and the omentum was tacked over the anastomosis with a few interrupted 2- 0 silk sutures.  The anastomosis was replaced back into the abdomen, and the wound tractor was secured, and the laparoscopic instruments were replaced, and we inspected the abdomen for hemostasis, which was noted be adequate.  There was no twisting of the mesentery, and exploration of the abdomen revealed no evidence of bowel injury, and again the abdomen was noted to be hemostatic.  There is no evidence of liver metastases, and she did have a small left inguinal hernia noted.  The wound retractor was removed, and the fascia was closed using 0 looped  PDS suture x2 at the supraumbilical fascia, and the left lower quadrant 12- mm trocar site fascia was approximated with a single 0 Vicryl suture. The abdomen is re-insufflated with carbon dioxide gas, and the abdominal closure was noted be adequate.  The abdomen was noted be hemostatic, and there was no evidence of bowel injury.  The 5-mm trocar was removed, and the wound was irrigated with sterile saline solution, and the skin edges were approximated.  The skin and wounds were injected with 30 mL of 0.25% Marcaine with epinephrine.  The skin edges were approximated with 4-0 Monocryl subcuticular suture.  Skin was washed and dried, and benzoin and Steri-Strips were applied and sterile dressing was applied.  The patient tolerated the procedure well without apparent complications, and all sponge, needle, instrument counts were correct at the end of the case.  The patient was stable and ready for transfer to the recovery room.          ______________________________ Lodema Pilot, MD     BL/MEDQ  D:  07/28/2011  T:  07/28/2011  Job:  [id number]  cc:   Willis Modena, MD

## 2011-07-28 NOTE — Anesthesia Postprocedure Evaluation (Signed)
  Anesthesia Post-op Note  Patient: [name]  Procedure(s) Performed:  LAPAROSCOPIC PARTIAL COLECTOMY - RIGHT LAPAROSCOPIC HEMI COLECTOMY.  Patient Location: PACU  Anesthesia Type: General  Level of Consciousness: awake  Airway and Oxygen Therapy: Patient Spontanous Breathing  Post-op Pain: mild  Post-op Assessment: Post-op Vital signs reviewed  Post-op Vital Signs: stable  Complications: No apparent anesthesia complications

## 2011-07-29 LAB — CBC
HCT: 29.3 % — ABNORMAL LOW (ref 36.0–46.0)
HCT: 30.9 % — ABNORMAL LOW (ref 36.0–46.0)
Hemoglobin: 10.2 g/dL — ABNORMAL LOW (ref 12.0–15.0)
MCH: 28.9 pg (ref 26.0–34.0)
MCHC: 33.1 g/dL (ref 30.0–36.0)
MCHC: 33 g/dL (ref 30.0–36.0)
Platelets: 274 10*3/uL (ref 150–400)
RBC: 3.53 MIL/uL — ABNORMAL LOW (ref 3.87–5.11)
RDW: 13.2 % (ref 11.5–15.5)
WBC: 13.4 10*3/uL — ABNORMAL HIGH (ref 4.0–10.5)

## 2011-07-29 LAB — BASIC METABOLIC PANEL
BUN: 4 mg/dL — ABNORMAL LOW (ref 6–23)
Calcium: 8.6 mg/dL (ref 8.4–10.5)
Chloride: 105 mEq/L (ref 96–112)
Creatinine, Ser: 0.74 mg/dL (ref 0.50–1.10)
GFR calc Af Amer: >90 mL/min (ref >90)
GFR calc non Af Amer: >90 mL/min (ref >90)

## 2011-07-29 MED ORDER — BIOTENE DRY MOUTH MT LIQD
15.0000 mL | Freq: Two times a day (BID) | OROMUCOSAL | Status: DC
  Administered 2011-07-30 – 2011-07-31 (×3): 15 mL via OROMUCOSAL

## 2011-07-29 MED ORDER — CHLORHEXIDINE GLUCONATE 0.12 % MT SOLN
15.0000 mL | Freq: Two times a day (BID) | OROMUCOSAL | Status: DC
  Administered 2011-07-30 – 2011-08-01 (×6): 15 mL via OROMUCOSAL
  Filled 2011-07-29 (×5): qty 15

## 2011-07-29 MED ORDER — MORPHINE SULFATE 2 MG/ML IJ SOLN: 1.0000 mg | INTRAMUSCULAR | Status: DC | PRN

## 2011-07-29 NOTE — Progress Notes (Signed)
She looks and feels well. Tolerating liquids. No nausea.  HR 100.  Will repeat cbc to ensure stability given elevated HR and decrease hgb on this am labs.  She otherwise looks well without signs of bleeding.

## 2011-07-29 NOTE — Progress Notes (Signed)
1 Day Post-Op  Subjective: No flatus but feeling hungry.  Pain well controlled. Foley out this am. on IS  Objective: Vital signs in last 24 hours: Temp:  [97.1 F (36.2 C)-99.7 F (37.6 C)] 99 F (37.2 C) (02/12 0640) Pulse Rate:  [70-87] 87  (02/12 0640) Resp:  [13-20] 18  (02/12 0640) BP: (85-116)/(48-65) 108/51 mmHg (02/12 0640) SpO2:  [95 %-100 %] 97 % (02/12 0640) Weight:  [138 lb 4.8 oz (62.732 kg)] 138 lb 4.8 oz (62.732 kg) (02/11 1131) Last BM Date: 07/27/11  Intake/Output from previous day: 02/11 0701 - 02/12 0700 In: 2709 [I.V.:2709] Out: 2995 [Urine:1575; Blood:20] Intake/Output this shift:    General appearance: alert, cooperative and no distress Resp: nonlabored Cardio: normal rate, regular rhythm GI: soft, minimal incisional tenderness, appropriate, ND, wounds okay  Lab Results:   [facility] 07/29/11 0520  WBC 13.4*  HGB 9.7*  HCT 29.3*  PLT 274   BMET  Basename 07/29/11 0520  NA 138  K 3.8  CL 105  CO2 30  GLUCOSE 118*  BUN 4*  CREATININE 0.74  CALCIUM 8.6   PT/INR No results found for this basename: LABPROT:2,INR:2 in the last 72 hours ABG No results found for this basename: PHART:2,PCO2:2,PO2:2,HCO3:2 in the last 72 hours  Studies/Results: No results found.  Anti-infectives: Anti-infectives     Start     Dose/Rate Route Frequency Ordered Stop   07/29/11 0700   ertapenem (INVANZ) 1 g in sodium chloride 0.9 % 50 mL IVPB        1 g 100 mL/hr over 30 Minutes Intravenous  Once 07/28/11 1133 07/29/11 0705   07/27/11 1500   ertapenem (INVANZ) 1 g in sodium chloride 0.9 % 50 mL IVPB        1 g 100 mL/hr over 30 Minutes Intravenous 60 min pre-op 07/27/11 1450 07/28/11 0722          Assessment/Plan: s/p Procedure(s) (LRB): LAPAROSCOPIC PARTIAL COLECTOMY (Right) d/c foley Advance diet will trial liquid diet and oral pain medications.  ambulate. she looks good. Will repeat labs tomorrow to ensure stability.  LOS: 1 day     Lodema Pilot DAVID 07/29/2011

## 2011-07-30 LAB — DIFFERENTIAL
Basophils Absolute: 0 10*3/uL (ref 0.0–0.1)
Basophils Relative: 0 % (ref 0–1)
Lymphocytes Relative: 15 % (ref 12–46)
Monocytes Absolute: 0.7 10*3/uL (ref 0.1–1.0)
Monocytes Relative: 7 % (ref 3–12)
Neutro Abs: 7.4 10*3/uL (ref 1.7–7.7)
Neutrophils Relative %: 77 % (ref 43–77)

## 2011-07-30 LAB — CBC
HCT: 30.5 % — ABNORMAL LOW (ref 36.0–46.0)
Hemoglobin: 10 g/dL — ABNORMAL LOW (ref 12.0–15.0)
MCHC: 32.8 g/dL (ref 30.0–36.0)
WBC: 9.7 10*3/uL (ref 4.0–10.5)

## 2011-07-30 LAB — BASIC METABOLIC PANEL
Chloride: 104 mEq/L (ref 96–112)
GFR calc Af Amer: >90 mL/min (ref >90)
GFR calc non Af Amer: >90 mL/min (ref >90)
Potassium: 4.1 mEq/L (ref 3.5–5.1)
Sodium: 139 mEq/L (ref 135–145)

## 2011-07-30 MED ORDER — KCL IN DEXTROSE-NACL 20-5-0.45 MEQ/L-%-% IV SOLN
INTRAVENOUS | Status: DC
  Administered 2011-07-30 (×2): via INTRAVENOUS
  Administered 2011-07-31: 125 mL/h via INTRAVENOUS
  Administered 2011-08-01 (×2): via INTRAVENOUS
  Filled 2011-07-30 (×7): qty 1000

## 2011-07-30 NOTE — Progress Notes (Signed)
2 Days Post-Op  Subjective: Tolerating clears.  No nausea.  Still no flatus or BM.  Pain improving and well controlled on oral meds   Objective: Vital signs in last 24 hours: Temp:  [98.9 F (37.2 C)-100.2 F (37.9 C)] 98.9 F (37.2 C) (02/13 0502) Pulse Rate:  [90-113] 90  (02/13 0502) Resp:  [18-20] 18  (02/13 0502) BP: (98-116)/(54-71) 116/71 mmHg (02/13 0502) SpO2:  [94 %-100 %] 94 % (02/13 0502) Last BM Date: 07/27/11  Intake/Output from previous day: 02/12 0701 - 02/13 0700 In: 1364.6 [P.O.:480; I.V.:884.6] Out: 1600 [Urine:1600] Intake/Output this shift:    General appearance: alert, cooperative and no distress Resp: nonlabored Cardio: normal rate, regular rhythm GI: soft, minimal incisional tenderness, mild distension but has active BS, wounds okay, no peritoneal signs  Lab Results:   [facility] 07/30/11 0615 07/29/11 1733  WBC 9.7 11.7*  HGB 10.0* 10.2*  HCT 30.5* 30.9*  PLT 247 254   BMET  Basename 07/30/11 0615 07/29/11 0520  NA 139 138  K 4.1 3.8  CL 104 105  CO2 25 30  GLUCOSE 87 118*  BUN 5* 4*  CREATININE 0.68 0.74  CALCIUM 8.8 8.6   PT/INR No results found for this basename: LABPROT:2,INR:2 in the last 72 hours ABG No results found for this basename: PHART:2,PCO2:2,PO2:2,HCO3:2 in the last 72 hours  Studies/Results: No results found.  Anti-infectives: Anti-infectives     Start     Dose/Rate Route Frequency Ordered Stop   07/29/11 0700   ertapenem (INVANZ) 1 g in sodium chloride 0.9 % 50 mL IVPB        1 g 100 mL/hr over 30 Minutes Intravenous  Once 07/28/11 1133 07/29/11 0705   07/27/11 1500   ertapenem (INVANZ) 1 g in sodium chloride 0.9 % 50 mL IVPB        1 g 100 mL/hr over 30 Minutes Intravenous 60 min pre-op 07/27/11 1450 07/28/11 0722          Assessment/Plan: s/p Procedure(s) (LRB): LAPAROSCOPIC PARTIAL COLECTOMY (Right) she still looks well and feels better today.  Pain improved and HR improved.  HGB stable.  She is  hungry but had some slight distension.  Will advance diet slowly given distension but otherwise she looks good.  LOS: 2 days    Lodema Pilot DAVID 07/30/2011

## 2011-07-31 NOTE — Progress Notes (Signed)
3 Days Post-Op  Subjective: Passing flatus and no nausea but still distended.  Denies any pain.  No appetite  Objective: Vital signs in last 24 hours: Temp:  [98.1 F (36.7 C)-99.4 F (37.4 C)] 99 F (37.2 C) (02/14 0550) Pulse Rate:  [82-89] 82  (02/14 0550) Resp:  [18-20] 18  (02/14 0550) BP: (123-125)/(70-80) 124/70 mmHg (02/14 0550) SpO2:  [98 %-100 %] 98 % (02/14 0550) Last BM Date: 07/27/11  Intake/Output from previous day: 02/13 0701 - 02/14 0700 In: 1380.5 [P.O.:360; I.V.:1020.5] Out: 1150 [Urine:1150] Intake/Output this shift:    General appearance: alert, cooperative and no distress Resp: clear to auscultation bilaterally Cardio: normal rate, regular rhythm GI: soft, nt, still with mild to moderate distension, very scant bowel sounds, wounds without infection  Lab Results:   Basename 07/30/11 0615 07/29/11 1733  WBC 9.7 11.7*  HGB 10.0* 10.2*  HCT 30.5* 30.9*  PLT 247 254   BMET  Basename 07/30/11 0615 07/29/11 0520  NA 139 138  K 4.1 3.8  CL 104 105  CO2 25 30  GLUCOSE 87 118*  BUN 5* 4*  CREATININE 0.68 0.74  CALCIUM 8.8 8.6   PT/INR No results found for this basename: LABPROT:2,INR:2 in the last 72 hours ABG No results found for this basename: PHART:2,PCO2:2,PO2:2,HCO3:2 in the last 72 hours  Studies/Results: No results found.  Anti-infectives: Anti-infectives     Start     Dose/Rate Route Frequency Ordered Stop   07/29/11 0700   ertapenem (INVANZ) 1 g in sodium chloride 0.9 % 50 mL IVPB        1 g 100 mL/hr over 30 Minutes Intravenous  Once 07/28/11 1133 07/29/11 0705   07/27/11 1500   ertapenem (INVANZ) 1 g in sodium chloride 0.9 % 50 mL IVPB        1 g 100 mL/hr over 30 Minutes Intravenous 60 min pre-op 07/27/11 1450 07/28/11 0722          Assessment/Plan: s/p Procedure(s) (LRB): LAPAROSCOPIC PARTIAL COLECTOMY (Right) She appears to have a postop ileus clinically with distension and hypoactive bowel sounds.  Abdomen is  otherwise benign and wounds without infection.  SHe is passing flatus but no significant bowel function.  Pathology was benign.  I will increase IV rate and await for resolution of ileus.  LOS: 3 days    Lodema Pilot DAVID 07/31/2011

## 2011-07-31 NOTE — Progress Notes (Signed)
Ur of chart completed.

## 2011-08-01 ENCOUNTER — Inpatient Hospital Stay (HOSPITAL_COMMUNITY): Payer: 59

## 2011-08-01 NOTE — Progress Notes (Signed)
4 Days Post-Op  Subjective: No new issues.  Passing flatus.  No BM.  No nausea  Objective: Vital signs in last 24 hours: Temp:  [98.3 F (36.8 C)-98.4 F (36.9 C)] 98.4 F (36.9 C) (02/15 0540) Pulse Rate:  [76-83] 76  (02/15 0540) Resp:  [17-18] 18  (02/15 0540) BP: (93-113)/(47-63) 93/47 mmHg (02/15 0540) SpO2:  [96 %-97 %] 96 % (02/15 0540) Last BM Date: 07/27/11  Intake/Output from previous day: 02/14 0701 - 02/15 0700 In: 405 [I.V.:405] Out: 1900 [Urine:1900] Intake/Output this shift:    General appearance: alert, cooperative and no distress Resp: clear to auscultation bilaterally Cardio: regular rate and rhythm, S1, S2 normal, no murmur, click, rub or gallop GI: soft, nt, ND, incisions without infection, distension improved, bowel sounds more active today  Lab Results:   [facility] 07/30/11 0615 07/29/11 1733  WBC 9.7 11.7*  HGB 10.0* 10.2*  HCT 30.5* 30.9*  PLT 247 254   BMET  Basename 07/30/11 0615  NA 139  K 4.1  CL 104  CO2 25  GLUCOSE 87  BUN 5*  CREATININE 0.68  CALCIUM 8.8   PT/INR No results found for this basename: LABPROT:2,INR:2 in the last 72 hours ABG No results found for this basename: PHART:2,PCO2:2,PO2:2,HCO3:2 in the last 72 hours  Studies/Results: No results found.  Anti-infectives: Anti-infectives     Start     Dose/Rate Route Frequency Ordered Stop   07/29/11 0700   ertapenem (INVANZ) 1 g in sodium chloride 0.9 % 50 mL IVPB        1 g 100 mL/hr over 30 Minutes Intravenous  Once 07/28/11 1133 07/29/11 0705   07/27/11 1500   ertapenem (INVANZ) 1 g in sodium chloride 0.9 % 50 mL IVPB        1 g 100 mL/hr over 30 Minutes Intravenous 60 min pre-op 07/27/11 1450 07/28/11 0722          Assessment/Plan: s/p Procedure(s) (LRB): LAPAROSCOPIC PARTIAL COLECTOMY (Right) Advance diet less distended today and she continues to pass flatus.  Abd xrays with gas throughout small and large intestine, no air fluid levels, likely ileus  pattern.  She looks good still and bowel sounds more active today.  We will try to slowly advance diet. She may be ready for discharge this weekend.  LOS: 4 days    Lodema Pilot DAVID 08/01/2011

## 2011-08-02 MED ORDER — HYDROCODONE-ACETAMINOPHEN 5-325 MG PO TABS: 1.0000 | ORAL_TABLET | ORAL | Status: AC | PRN

## 2011-08-02 NOTE — Discharge Instructions (Signed)
[facility]      [facility], [place] [phone]  OPEN ABDOMINAL SURGERY: POST OP INSTRUCTIONS  Always review your discharge instruction sheet given to you by the facility where your surgery was performed.  IF YOU HAVE DISABILITY OR FAMILY LEAVE FORMS, YOU MUST BRING THEM TO THE OFFICE FOR PROCESSING.  PLEASE DO NOT GIVE THEM TO YOUR DOCTOR.  1. A prescription for pain medication may be given to you upon discharge.  Take your pain medication as prescribed, if needed.  If narcotic pain medicine is not needed, then you may take acetaminophen (Tylenol) or ibuprofen (Advil) as needed. 2. Take your usually prescribed medications unless otherwise directed. 3. If you need a refill on your pain medication, please contact your pharmacy. They will contact our office to request authorization.  Prescriptions will not be filled after 5pm or on week-ends. 4. You should follow a light diet the first few days after arrival home, such as soup and crackers, pudding, etc.unless your doctor has advised otherwise. A high-fiber, low fat diet can be resumed as tolerated.   Be sure to include lots of fluids daily. Most patients will experience some swelling and bruising on the chest and neck area.  Ice packs will help.  Swelling and bruising can take several days to resolve 5. Most patients will experience some swelling and bruising in the area of the incision. Ice pack will help. Swelling and bruising can take several days to resolve..  6. It is common to experience some constipation if taking pain medication after surgery.  Increasing fluid intake and taking a stool softener will usually help or prevent this problem from occurring.  A mild laxative (Milk of Magnesia or Miralax) should be taken according to package directions if there are no bowel movements after 48 hours. 7.  You may have steri-strips (small skin tapes) in place directly over the incision.  These strips should be left on the skin for 7-10 days.  If your  surgeon used skin glue on the incision, you may shower in 24 hours.  The glue will flake off over the next 2-3 weeks.  Any sutures or staples will be removed at the office during your follow-up visit. You may find that a light gauze bandage over your incision may keep your staples from being rubbed or pulled. You may shower and replace the bandage daily. 8. ACTIVITIES:  You may resume regular (light) daily activities beginning the next day--such as daily self-care, walking, climbing stairs--gradually increasing activities as tolerated.  You may have sexual intercourse when it is comfortable.  Refrain from any heavy lifting or straining until approved by your doctor. a. You may drive when you no longer are taking prescription pain medication, you can comfortably wear a seatbelt, and you can safely maneuver your car and apply brakes b. Return to Work: __________to be determined._________________________ 9. You should see your doctor in the office for a follow-up appointment approximately two weeks after your surgery.  Make sure that you call for this appointment within a day or two after you arrive home to insure a convenient appointment time. OTHER INSTRUCTIONS:  _____________________________________________________________ _____________________________________________________________  WHEN TO CALL YOUR DOCTOR: 1. Fever over 101.0 2. Inability to urinate 3. Nausea and/or vomiting 4. Extreme swelling or bruising 5. Continued bleeding from incision. 6. Increased pain, redness, or drainage from the incision. 7. Difficulty swallowing or breathing 8. Muscle cramping or spasms. 9. Numbness or tingling in hands or feet or around lips.  The clinic staff is  available to answer your questions during regular business hours.  Please don't hesitate to call and ask to speak to one of the nurses if you have concerns.  For further questions, please visit [email or web address]

## 2011-08-02 NOTE — Progress Notes (Signed)
Patient ID: [name], female   DOB: 07-11-1960, 51 y.o.   MRN: [id number] 5 Days Post-Op  Subjective: Continues to have flatus and bowel movements.  Tolerated regular diet.    Objective: Vital signs in last 24 hours: Temp:  [98.2 F (36.8 C)-99.3 F (37.4 C)] 98.2 F (36.8 C) (02/16 0510) Pulse Rate:  [79-89] 79  (02/16 0510) Resp:  [16-20] 16  (02/16 0510) BP: (102-111)/(48-67) 102/48 mmHg (02/16 0510) SpO2:  [97 %-100 %] 98 % (02/16 0510) Last BM Date: 08-09-2011  Intake/Output from previous day: 08-08-22 0701 - 02/16 0700 In: 1639.4 [I.V.:1639.4] Out: 351 [Urine:350; Stool:1] Intake/Output this shift:    General appearance: alert, cooperative and no distress GI: soft, nt, ND, incisions without infection, non distended.    Lab Results:  No results found for this basename: WBC:2,HGB:2,HCT:2,PLT:2 in the last 72 hours BMET No results found for this basename: NA:2,K:2,CL:2,CO2:2,GLUCOSE:2,BUN:2,CREATININE:2,CALCIUM:2 in the last 72 hours PT/INR No results found for this basename: LABPROT:2,INR:2 in the last 72 hours ABG No results found for this basename: PHART:2,PCO2:2,PO2:2,HCO3:2 in the last 72 hours  Studies/Results: Dg Abd 2 Views  08-09-2011  *RADIOLOGY REPORT*  Clinical Data: Abdominal pain, distention.  ABDOMEN - 2 VIEW  Comparison: CT 07/07/2011  Findings: Diffuse gaseous distention of large and small bowel. Findings suggest ileus.  This is new since prior CT.  No free air, organomegaly or suspicious calcification.  No acute bony abnormality.  IMPRESSION: Diffuse gaseous distention of bowel, likely ileus.  Original Report Authenticated By: Cyndie Chime, M.D.    Anti-infectives: Anti-infectives     Start     Dose/Rate Route Frequency Ordered Stop   07/29/11 0700   ertapenem (INVANZ) 1 g in sodium chloride 0.9 % 50 mL IVPB        1 g 100 mL/hr over 30 Minutes Intravenous  Once 07/28/11 1133 07/29/11 0705   07/27/11 1500   ertapenem (INVANZ) 1 g in sodium  chloride 0.9 % 50 mL IVPB        1 g 100 mL/hr over 30 Minutes Intravenous 60 min pre-op 07/27/11 1450 07/28/11 0722          Assessment/Plan: s/p Procedure(s) (LRB): LAPAROSCOPIC PARTIAL COLECTOMY (Right)  LOS: 5 days  Plan d/c home Follow up with Dr. Biagio Quint.  [facility] 08/02/2011

## 2011-08-04 NOTE — Discharge Summary (Signed)
Physician Discharge Summary  Patient ID: [name] MRN: [id number] DOB/AGE: 09-17-1960 50 y.o.  Admit date: 07/28/2011 Discharge date: 08/04/2011  Admission Diagnoses: right colon mass  Discharge Diagnoses: right colon mass Active Problems:  * No active hospital problems. *    Discharged Condition: stable  Hospital Course: to or for laparoscopic right hemicolectomy.  No complications with the procedure and path was benign.  She did develop a mild postop ileus which resolved and diet was slowly advanced.  She did well with the diet and was stable and ready for discharge to home POD 5.  Consults: none  Significant Diagnostic Studies: none  Treatments: laparoscopic right hemicolectomy   Disposition: 01-Home or Self Care  Discharge Orders    Future Appointments: Provider: Department: Dept Phone: Center:   08/13/2011 2:00 PM Rulon Abide, DO [facility] [phone] None     Future Orders Please Complete By Expires   Diet - low sodium heart healthy      Increase activity slowly      Call MD for:  temperature >100.4      Call MD for:  persistant nausea and vomiting      Call MD for:  severe uncontrolled pain      Call MD for:  redness, tenderness, or signs of infection (pain, swelling, redness, odor or green/yellow discharge around incision site)        Medication List  As of 08/04/2011  6:54 PM   TAKE these medications         amphetamine-dextroamphetamine 30 MG tablet   Commonly known as: ADDERALL   Take 30 mg by mouth daily.      ARIPiprazole 5 MG tablet   Commonly known as: ABILIFY   Take 2.5 mg by mouth daily.      buPROPion 75 MG tablet   Commonly known as: WELLBUTRIN   Take 150 mg by mouth daily.      CALCIUM + D + K PO   Take 1 tablet by mouth daily.      clonazePAM 0.5 MG tablet   Commonly known as: KLONOPIN   Take 0.5 mg by mouth 3 (three) times daily as needed. For anxiety      darifenacin 7.5 MG 24 hr tablet   Commonly known as:  ENABLEX   Take 15 mg by mouth daily.      HYDROcodone-acetaminophen 5-325 MG per tablet   Commonly known as: NORCO   Take 1-2 tablets by mouth every 4 (four) hours as needed for pain.      mulitivitamin with minerals Tabs   Take 1 tablet by mouth daily.           Follow-up Information    Follow up with Sharlize Hoar DAVID, DO. Schedule an appointment as soon as possible for a visit in 2 weeks.   Contact information:   [address] [place] [address] [phone]          Signed: Lodema Pilot DAVID 08/04/2011, 6:54 PM

## 2011-08-13 ENCOUNTER — Encounter (INDEPENDENT_AMBULATORY_CARE_PROVIDER_SITE_OTHER): Payer: Self-pay | Admitting: General Surgery

## 2011-08-13 ENCOUNTER — Encounter (INDEPENDENT_AMBULATORY_CARE_PROVIDER_SITE_OTHER): Payer: Self-pay

## 2011-08-13 ENCOUNTER — Ambulatory Visit (INDEPENDENT_AMBULATORY_CARE_PROVIDER_SITE_OTHER): Payer: 59 | Admitting: General Surgery

## 2011-08-13 VITALS — BP 104/68 | HR 80 | Temp 98.4°F | Resp 16 | Ht 63.0 in | Wt 133.6 lb

## 2011-08-13 DIAGNOSIS — Z5189 Encounter for other specified aftercare: Secondary | ICD-10-CM

## 2011-08-13 DIAGNOSIS — Z4889 Encounter for other specified surgical aftercare: Secondary | ICD-10-CM

## 2011-08-13 NOTE — Progress Notes (Signed)
Subjective:     Patient ID: [name], female   DOB: [birth year], 51 y.o.   MRN: [id number]  HPI This patient has a 2 week status post laparoscopic right hemicolectomy for unresectable polyp in the right colon. 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 She had a mild postoperative ileus which has now resolved and she is moving her bowels daily once or twice per day with normal consistency. She is taking fiber supplement and is tolerating regular diet without any problems. She is pain-free and asking when she can return to work. Review of Systems     Objective:   Physical Exam No acute distress and nontoxic-appearing  Her abdomen is soft and nontender on exam her incisions are well-healed without signs of infection she has good cosmetic result.    Assessment:     Status post laparoscopic right hemicolectomy-doing very well Her pathology was benign. She has done very well and is back to her normal activities and is wondering when she can return to work. She has had no apparent side effects from her procedure although she does have some more frequent bowel movements which is to be expected status post colectomy. She is taking fiber supplement which I encouraged her to continue. Given her pathology I recommended that she follow up with Dr. Dulce Sellar to discuss timing of followup surveillance colonoscopy which I would recommend within the next 2 years. Asymptomatic, small, left inguinal hernia She has a small left inguinal found at the time of laparoscopy and she has no bulge in the area of concern. I think that it would be fine to continue with watchful waiting for this asymptomatic small inguinal hernia.    Plan:     Followup with Dr. Dulce Sellar to discuss followup colonoscopy. She can return to activities as tolerated and return to work as tolerated. I placed no restrictions on her.

## 2012-02-18 ENCOUNTER — Other Ambulatory Visit (HOSPITAL_COMMUNITY): Payer: Self-pay | Admitting: Family Medicine

## 2012-02-18 DIAGNOSIS — Z1231 Encounter for screening mammogram for malignant neoplasm of breast: Secondary | ICD-10-CM

## 2012-02-26 ENCOUNTER — Ambulatory Visit (HOSPITAL_COMMUNITY)
Admission: RE | Admit: 2012-02-26 | Discharge: 2012-02-26 | Disposition: A | Discharge status: Home / Self Care | Payer: 59 | Source: Ambulatory Visit | Attending: Family Medicine | Admitting: Family Medicine

## 2012-02-26 DIAGNOSIS — Z1231 Encounter for screening mammogram for malignant neoplasm of breast: Secondary | ICD-10-CM

## 2012-06-21 ENCOUNTER — Other Ambulatory Visit (HOSPITAL_COMMUNITY)
Admission: RE | Admit: 2012-06-21 | Discharge: 2012-06-21 | Disposition: A | Discharge status: Home / Self Care | Payer: 59 | Source: Ambulatory Visit | Attending: Family Medicine | Admitting: Family Medicine

## 2012-06-21 ENCOUNTER — Other Ambulatory Visit: Payer: Self-pay | Admitting: Family Medicine

## 2012-06-21 DIAGNOSIS — Z0000 Encounter for general adult medical examination without abnormal findings: Secondary | ICD-10-CM | POA: Insufficient documentation

## 2012-10-20 ENCOUNTER — Ambulatory Visit
Admission: RE | Admit: 2012-10-20 | Discharge: 2012-10-20 | Disposition: A | Discharge status: Home / Self Care | Payer: 59 | Source: Ambulatory Visit | Attending: Family Medicine | Admitting: Family Medicine

## 2012-10-20 ENCOUNTER — Other Ambulatory Visit: Payer: Self-pay | Admitting: Family Medicine

## 2012-10-20 DIAGNOSIS — R222 Localized swelling, mass and lump, trunk: Secondary | ICD-10-CM

## 2012-10-25 ENCOUNTER — Other Ambulatory Visit: Payer: Self-pay | Admitting: Family Medicine

## 2012-10-25 DIAGNOSIS — N6310 Unspecified lump in the right breast, unspecified quadrant: Secondary | ICD-10-CM

## 2012-10-28 ENCOUNTER — Other Ambulatory Visit: Payer: Self-pay | Admitting: Radiology

## 2012-10-28 DIAGNOSIS — C50919 Malignant neoplasm of unspecified site of unspecified female breast: Secondary | ICD-10-CM

## 2012-10-28 HISTORY — PX: BREAST BIOPSY: SHX20

## 2012-10-28 HISTORY — DX: Malignant neoplasm of unspecified site of unspecified female breast: C50.919

## 2012-10-29 ENCOUNTER — Other Ambulatory Visit: Payer: Self-pay | Admitting: Radiology

## 2012-10-29 DIAGNOSIS — C50911 Malignant neoplasm of unspecified site of right female breast: Secondary | ICD-10-CM

## 2012-11-01 ENCOUNTER — Telehealth: Payer: Self-pay | Admitting: *Deleted

## 2012-11-01 ENCOUNTER — Ambulatory Visit
Admission: RE | Admit: 2012-11-01 | Discharge: 2012-11-01 | Disposition: A | Discharge status: Home / Self Care | Payer: 59 | Source: Ambulatory Visit | Attending: Radiology | Admitting: Radiology

## 2012-11-01 ENCOUNTER — Other Ambulatory Visit: Payer: 59

## 2012-11-01 DIAGNOSIS — C50911 Malignant neoplasm of unspecified site of right female breast: Secondary | ICD-10-CM

## 2012-11-01 DIAGNOSIS — C50311 Malignant neoplasm of lower-inner quadrant of right female breast: Secondary | ICD-10-CM

## 2012-11-01 DIAGNOSIS — C50319 Malignant neoplasm of lower-inner quadrant of unspecified female breast: Secondary | ICD-10-CM | POA: Insufficient documentation

## 2012-11-01 MED ORDER — GADOBENATE DIMEGLUMINE 529 MG/ML IV SOLN
11.0000 mL | Freq: Once | INTRAVENOUS | Status: AC | PRN
  Administered 2012-11-01: 11 mL via INTRAVENOUS

## 2012-11-01 NOTE — Telephone Encounter (Signed)
Confirmed [facility] for 11/03/12 at 0800 .  Instructions and contact information given.

## 2012-11-03 ENCOUNTER — Telehealth: Payer: Self-pay | Admitting: *Deleted

## 2012-11-03 ENCOUNTER — Encounter: Payer: Self-pay | Admitting: *Deleted

## 2012-11-03 ENCOUNTER — Ambulatory Visit: Payer: 59 | Attending: General Surgery | Admitting: Physical Therapy

## 2012-11-03 ENCOUNTER — Ambulatory Visit: Payer: 59

## 2012-11-03 ENCOUNTER — Ambulatory Visit
Admission: RE | Admit: 2012-11-03 | Discharge: 2012-11-03 | Disposition: A | Discharge status: Home / Self Care | Payer: 59 | Source: Ambulatory Visit | Attending: Radiation Oncology | Admitting: Radiation Oncology

## 2012-11-03 ENCOUNTER — Encounter: Payer: Self-pay | Admitting: Oncology

## 2012-11-03 ENCOUNTER — Encounter (INDEPENDENT_AMBULATORY_CARE_PROVIDER_SITE_OTHER): Payer: Self-pay | Admitting: General Surgery

## 2012-11-03 ENCOUNTER — Ambulatory Visit (HOSPITAL_BASED_OUTPATIENT_CLINIC_OR_DEPARTMENT_OTHER): Payer: 59 | Admitting: Oncology

## 2012-11-03 ENCOUNTER — Ambulatory Visit (HOSPITAL_BASED_OUTPATIENT_CLINIC_OR_DEPARTMENT_OTHER): Payer: 59 | Admitting: General Surgery

## 2012-11-03 ENCOUNTER — Other Ambulatory Visit (HOSPITAL_BASED_OUTPATIENT_CLINIC_OR_DEPARTMENT_OTHER): Payer: 59

## 2012-11-03 VITALS — BP 122/81 | HR 74 | Temp 97.6°F | Resp 18 | Ht 63.0 in | Wt 124.4 lb

## 2012-11-03 DIAGNOSIS — IMO0001 Reserved for inherently not codable concepts without codable children: Secondary | ICD-10-CM | POA: Insufficient documentation

## 2012-11-03 DIAGNOSIS — C50311 Malignant neoplasm of lower-inner quadrant of right female breast: Secondary | ICD-10-CM

## 2012-11-03 DIAGNOSIS — C50919 Malignant neoplasm of unspecified site of unspecified female breast: Secondary | ICD-10-CM | POA: Insufficient documentation

## 2012-11-03 DIAGNOSIS — Z170 Estrogen receptor positive status [ER+]: Secondary | ICD-10-CM

## 2012-11-03 DIAGNOSIS — C50319 Malignant neoplasm of lower-inner quadrant of unspecified female breast: Secondary | ICD-10-CM

## 2012-11-03 LAB — CBC WITH DIFFERENTIAL/PLATELET
BASO%: 0.8 % (ref 0.0–2.0)
Basophils Absolute: 0.1 10*3/uL (ref 0.0–0.1)
EOS%: 2.5 % (ref 0.0–7.0)
Eosinophils Absolute: 0.2 10*3/uL (ref 0.0–0.5)
HCT: 39.8 % (ref 34.8–46.6)
HGB: 12.9 g/dL (ref 11.6–15.9)
LYMPH%: 32.3 % (ref 14.0–49.7)
MCH: 28.3 pg (ref 25.1–34.0)
MCHC: 32.5 g/dL (ref 31.5–36.0)
MCV: 87.1 fL (ref 79.5–101.0)
MONO#: 0.5 10*3/uL (ref 0.1–0.9)
MONO%: 7.9 % (ref 0.0–14.0)
NEUT#: 3.5 10*3/uL (ref 1.5–6.5)
NEUT%: 56.5 % (ref 38.4–76.8)
Platelets: 290 10*3/uL (ref 145–400)
RBC: 4.57 10*6/uL (ref 3.70–5.45)
RDW: 13.1 % (ref 11.2–14.5)
WBC: 6.2 10*3/uL (ref 3.9–10.3)
lymph#: 2 10*3/uL (ref 0.9–3.3)

## 2012-11-03 LAB — COMPREHENSIVE METABOLIC PANEL (CC13)
AST: 15 U/L (ref 5–34)
BUN: 14.3 mg/dL (ref 7.0–26.0)
CO2: 28 mEq/L (ref 22–29)
Calcium: 9.7 mg/dL (ref 8.4–10.4)
Chloride: 102 mEq/L (ref 98–107)
Creatinine: 0.8 mg/dL (ref 0.6–1.1)
Glucose: 93 mg/dl (ref 70–99)

## 2012-11-03 NOTE — Progress Notes (Signed)
Checked in new pt with no financial concerns. °

## 2012-11-03 NOTE — Progress Notes (Signed)
ID: [name] OB: [birth year]  MR#: [id number]  WUJ#:[id number]  PCP: Allean Found, MD GYN:   SUClaud Kelp OTHER MD: Rogelia Mire, Deri Fuelling   HISTORY OF PRESENT ILLNESS: [name] had a routine colonoscopy under Dr. Dulce Sellar approximately a year and a half ago, showing a right colon lesion which could not be removed intraoperatively. CT scans of the abdomen and pelvis 07/07/2011 were negative except for the question of a soft tissue lesion in the right colon, and particularly there was no adenopathy or evidence of metastatic disease. She underwent partial colectomy under Dr. Lodema Pilot 07/28/2011, and the pathology from that procedure ([id number]) showed a 3.2 cm tubular adenoma without high-grade dysplasia or malignancy. Margins were negative. 0 of 16 lymph nodes were involved.  And the patient has been working at weight loss and managed to lose approximately 25 pounds over the past year. She feels this is what allowed her to palpate a mass in her right breast earlier this month. (She had had negative mammographic screening 02/26/2012). On 10/27/2012 the patient underwent bilateral diagnostic mammography. The breasts are "extremely dense". Mammography did not show any new or worrisome finding. Right breast ultrasound however located an irregular hypoechoic mass in the area of the patient's palpable abnormality. It measured approximately 7 mm. Biopsy of this mass 10/28/2012 showed an invasive ductal carcinoma, grade 2, estrogen and progesterone receptor positive, with no HER-2 amplification, and an MIB-1 of 5%.  Bilateral breast MRI 11/01/2012 found a 1.3 cm irregular enhancing mass in the lower inner quadrant of the right breast, with no other masses of concern in either breast and no abnormal appearing lymph nodes.  The patient's subsequent history is as detailed below.   INTERVAL HISTORY: [name] was evaluated in the multidisciplinary breast cancer clinic  11/03/2012 accompanied by her husband [name]  REVIEW OF SYSTEMS: She tolerated the biopsy well, without unusual complications. As noted, she has lost 25 pounds through exercise and dieting over the past year. (She is working with Dr. Albertine Grates in [place] on this). She is sleeping poorly now because of stress issues, has some hemorrhoidal bleeding, has a history of "irritable bladder", and a history of depression, which is now well-controlled. Otherwise a detailed review of systems today was noncontributory  PAST MEDICAL HISTORY: Past Medical History  Diagnosis Date  . Asthma     as a child, early adult...none now  . Recurrent upper respiratory infection (URI)     started 1/28.....getting better  . GERD (gastroesophageal reflux disease)     occas  at  night....twice a  month--otc meds  . Depression   . Anxiety   . Colonic mass     ascending  . Breast cancer     PAST SURGICAL HISTORY: Past Surgical History  Procedure Laterality Date  . Tubal ligation    . Hemicolectomy  07/28/11    FAMILY HISTORY Family History  Problem Relation Age of Onset  . Heart disease Father    the patient's father died from congestive far to failure at the age of 53. The patient's mother is still living, in her early 25s. The patient has one brother, 4 sisters. There is no history of breast or ovarian cancer in the family to the patient's knowledge.  GYNECOLOGIC HISTORY:  Menarche age 36, first live birth age 29. The patient's less. It was September 2013 and the one before that was March 2013. She is not taking hormone replacement. She did take oral contraceptives for several years  intermittently in the past. There were no complications.   SOCIAL HISTORY:  [name] is originally from [place]. 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 She is not a cancel her, but her husband, [name] (goes by "[name]" works as a Veterinary surgeon for the [employer] as well as having his own clinic in [place]. He is originally from [place]. The patient's children are [name], who lives in [place] and has a Scientist, water quality in counseling; and [name], who is a Consulting civil engineer at [employer] He lives with the patient, and shares custody of his daughter, who is 26 years old and spends every other weekend at the patient's home.    ADVANCED DIRECTIVES: In place   HEALTH MAINTENANCE: History  Substance Use Topics  . Smoking status: Never Smoker   . Smokeless tobacco: Not on file  . Alcohol Use: Yes     Colonoscopy: 06/02/2011 / [facility]  PAP:  Bone density:  Lipid panel:  No Known Allergies  Current Outpatient Prescriptions  Medication Sig Dispense Refill  . ARIPiprazole (ABILIFY) 5 MG tablet Take 2.5 mg by mouth daily.      Marland Kitchen buPROPion (WELLBUTRIN) 75 MG tablet Take 150 mg by mouth daily.      . Calcium-Vitamin D-Vitamin K (CALCIUM + D + K PO) Take 1 tablet by mouth daily.      . clonazePAM (KLONOPIN) 0.5 MG tablet Take 0.5 mg by mouth 3 (three) times daily as needed. For anxiety      . Multiple Vitamin (MULITIVITAMIN WITH MINERALS) TABS Take 1 tablet by mouth daily.      Marland Kitchen oxybutynin (DITROPAN-XL) 10 MG 24 hr tablet Take 10 mg by mouth daily.      . phentermine 37.5 MG capsule Take 37.5 mg by mouth every morning. 1/2 daily      . traZODone (DESYREL) 150 MG tablet Take 150 mg by mouth at bedtime. 1/2 daily       No current facility-administered medications for this visit.    OBJECTIVE: Middle-aged Latin American woman who appears well Filed Vitals:   11/03/12 0847  BP: 122/81  Pulse: 74  Temp: 97.6 F (36.4 C)  Resp: 18     Body mass index is 22.04 kg/(m^2).    ECOG FS: 0  Sclerae unicteric Oropharynx clear No cervical or supraclavicular adenopathy Lungs no rales or rhonchi Heart regular rate and rhythm Abd soft, nontender, positive bowel sounds, no masses palpated MSK no focal spinal tenderness, no peripheral edema Neuro:  nonfocal, well oriented, appropriate affect Breasts: The right breast is status post recent biopsy. There is a significant ecchymosis, but no palpable mass. There is no skin or nipple change of concern. The right axilla is benign. The left breast is unremarkable.   LAB RESULTS:  CMP     Component Value Date/Time   NA 142 11/03/2012 0821   NA 139 07/30/2011 0615   K 4.0 11/03/2012 0821   K 4.1 07/30/2011 0615   CL 102 11/03/2012 0821   CL 104 07/30/2011 0615   CO2 28 11/03/2012 0821   CO2 25 07/30/2011 0615   GLUCOSE 93 11/03/2012 0821   GLUCOSE 87 07/30/2011 0615   BUN 14.3 11/03/2012 0821   BUN 5* 07/30/2011 0615   CREATININE 0.8 11/03/2012 0821   CREATININE 0.68 07/30/2011 0615   CALCIUM 9.7 11/03/2012 0821   CALCIUM 8.8 07/30/2011 0615   PROT 7.6 11/03/2012 0821   PROT 7.9 07/22/2011 0845   ALBUMIN 4.2 11/03/2012  9562   ALBUMIN 4.0 07/22/2011 0845   AST 15 11/03/2012 0821   AST 14 07/22/2011 0845   ALT 16 11/03/2012 0821   ALT 14 07/22/2011 0845   ALKPHOS 53 11/03/2012 0821   ALKPHOS 56 07/22/2011 0845   BILITOT 0.49 11/03/2012 0821   BILITOT 0.3 07/22/2011 0845   GFRNONAA >90 07/30/2011 0615   GFRAA >90 07/30/2011 0615    I No results found for this basename: SPEP, UPEP,  kappa and lambda light chains    Lab Results  Component Value Date   WBC 6.2 11/03/2012   NEUTROABS 3.5 11/03/2012   HGB 12.9 11/03/2012   HCT 39.8 11/03/2012   MCV 87.1 11/03/2012   PLT 290 11/03/2012      Chemistry      Component Value Date/Time   NA 142 11/03/2012 0821   NA 139 07/30/2011 0615   K 4.0 11/03/2012 0821   K 4.1 07/30/2011 0615   CL 102 11/03/2012 0821   CL 104 07/30/2011 0615   CO2 28 11/03/2012 0821   CO2 25 07/30/2011 0615   BUN 14.3 11/03/2012 0821   BUN 5* 07/30/2011 0615   CREATININE 0.8 11/03/2012 0821   CREATININE 0.68 07/30/2011 0615      Component Value Date/Time   CALCIUM 9.7 11/03/2012 0821   CALCIUM 8.8 07/30/2011 0615   ALKPHOS 53 11/03/2012 0821   ALKPHOS 56 07/22/2011 0845   AST 15 11/03/2012 0821    AST 14 07/22/2011 0845   ALT 16 11/03/2012 0821   ALT 14 07/22/2011 0845   BILITOT 0.49 11/03/2012 0821   BILITOT 0.3 07/22/2011 0845       No results found for this basename: LABCA2    No components found with this basename: LABCA125    No results found for this basename: INR,  in the last 168 hours  Urinalysis No results found for this basename: colorurine, appearanceur, labspec, phurine, glucoseu, hgbur, bilirubinur, ketonesur, proteinur, urobilinogen, nitrite, leukocytesur    STUDIES: Dg Ribs Unilateral W/chest Right  10/20/2012   *RADIOLOGY REPORT*  Clinical Data: Palpable mass under right breast.  RIGHT RIBS AND CHEST - 3+ VIEW  Comparison: Chest radiographs 07/22/2011.  Findings: A metallic BB was placed over the palpable concern on the rib images.  The heart size and mediastinal contours are stable. The lungs are clear.  There is no pleural effusion or pneumothorax. No rib fracture or focal rib lesion is identified.  No soft tissue calcification or mass is identified radiographically.  IMPRESSION: No active cardiopulmonary process or chest wall abnormality identified to correspond with the palpable concern.  Clinical follow-up of any palpable concern recommended with consideration of further evaluation by ultrasound as warranted clinically.   Original Report Authenticated By: Carey Bullocks, M.D.   Mr Breast Bilateral W Wo Contrast  11/02/2012   *RADIOLOGY REPORT*  Clinical Data: Recently diagnosed right breast invasive ductal carcinoma.  BILATERAL BREAST MRI WITH AND WITHOUT CONTRAST  Technique: Multiplanar, multisequence MR images of both breasts were obtained prior to and following the intravenous administration of 11ml of MultiHance.  Three dimensional images were evaluated at the independent DynaCad workstation.  Comparison:  Previous mammogram and ultrasound examinations at [facility].  Findings: Minimal background parenchymal enhancement both breasts.  1.3 x 0.8 x 0.7 cm  irregular enhancing mass in the lower inner quadrant of the right breast, posteriorly.  This has predominately persistent and plateau enhancement kinetics.  No additional masses or areas of enhancement suspicious for malignancy in  either breast.  No abnormal appearing lymph nodes.  IMPRESSION: 1.3 x 0.8 x 0.7 cm biopsy-proven invasive ductal carcinoma in the posterior aspect of the lower inner quadrant of the right breast. Otherwise, unremarkable examination.  RECOMMENDATION: Treatment plan  THREE-DIMENSIONAL MR IMAGE RENDERING ON INDEPENDENT WORKSTATION:  Three-dimensional MR images were rendered by post-processing of the original MR data on an independent workstation.  The three- dimensional MR images were interpreted, and findings were reported in the accompanying complete MRI report for this study.  BI-RADS CATEGORY 6:  Known biopsy-proven malignancy - appropriate action should be taken.   Original Report Authenticated By: Beckie Salts, M.D.    ASSESSMENT: 52 y.o. [place] woman status post right breast biopsy 10/28/2012 for a clinical T1c N0, stage IA invasive ductal carcinoma, grade 2, estrogen and progesterone receptor positive, HER-2 not amplified, with an MIB-1 of 20%  PLAN: We spent the better part of today's hour-long visit discussing the treatment of breast cancer in general and the specifics of Kathlean's situation. They understand that she has a good prognosis overall, but she may need chemotherapy, and to help [place] answer that question we will send an Oncotype DX from the definitive procedure, which in her case is going to be a lumpectomy and sentinel lymph node biopsy. I am making her a return appointment with me approximately 2 weeks after that to discuss results, but my hope is that we can avoid chemotherapy, proceed directly to radiation, and then start tamoxifen.  She inquired about genetic testing, but there is no family history of breast cancer, despite ample representation (4 sisters),  and she is over 21. I am confirming this however with our genetics counselor just to make sure.  We are giving them copies of all the results and helping them arrange to pick up a disc with all her imaging to facilitate their second opinion at [facility] scheduled for later this week. They know to call for any problems that may develop before the next visit here.   Lowella Dell, MD   11/03/2012 12:09 PM

## 2012-11-03 NOTE — Telephone Encounter (Signed)
appts made and printed...td 

## 2012-11-03 NOTE — Progress Notes (Signed)
Patient ID: [name], female   DOB: 1961-05-06, 52 y.o.   MRN: [id number]  No chief complaint on file.   HPI [name] is a 52 y.o. female.  She is referred by Dr. Rogelia Mire at [facility] health for evaluation of a newly diagnosed invasive ductal carcinoma of the right breast in the far posterior aspect of the lower-inner quadrant of the right breast. She is being evaluated in the [facility] today by Dr. Darnelle Catalan, Dr. Michell Heinrich, and me.   This is a healthy patient who gets mammograms every year or 2. She recently felt a small lump in her right breast in the lower inner quadrant. Mammogram showed very dense breast tissue and could not see the lesion. Target ultrasound was able to see a small area. Biopsy was performed under ultrasound guidance. No clip was placed. This shows invasive mammary carcinoma, probably invasive ductal carcinoma. We ceptor-positive, HER-2-negative. Subsequent MRI shows a 1.3 cm mass in the lower inner quadrant of the right breast, posterior, this does not appear to invade the chest wall. It is a solitary finding.  She is in the [facility] today. She has a second opinion at [facility] this Friday. Her husband is with her today.  Past history significant for a laparoscopic right colectomy by Dr. Lodema Pilot for adenomatous polyp, GERD, depression, tubal ligation. Family history is negative for breast or ovarian cancer. HPI  Past Medical History  Diagnosis Date  . Asthma     as a child, early adult...none now  . Recurrent upper respiratory infection (URI)     started 1/28.....getting better  . GERD (gastroesophageal reflux disease)     occas  at  night....twice a  month--otc meds  . Depression   . Anxiety   . Colonic mass     ascending  . Breast cancer     Past Surgical History  Procedure Laterality Date  . Tubal ligation    . Hemicolectomy  07/28/11    Family History  Problem Relation Age of Onset  . Heart disease Father     Social History History    Substance Use Topics  . Smoking status: Never Smoker   . Smokeless tobacco: Not on file  . Alcohol Use: Yes    No Known Allergies  Current Outpatient Prescriptions  Medication Sig Dispense Refill  . ARIPiprazole (ABILIFY) 5 MG tablet Take 2.5 mg by mouth daily.      Marland Kitchen buPROPion (WELLBUTRIN) 75 MG tablet Take 150 mg by mouth daily.      . Calcium-Vitamin D-Vitamin K (CALCIUM + D + K PO) Take 1 tablet by mouth daily.      . clonazePAM (KLONOPIN) 0.5 MG tablet Take 0.5 mg by mouth 3 (three) times daily as needed. For anxiety      . Multiple Vitamin (MULITIVITAMIN WITH MINERALS) TABS Take 1 tablet by mouth daily.      Marland Kitchen oxybutynin (DITROPAN-XL) 10 MG 24 hr tablet Take 10 mg by mouth daily.      . phentermine 37.5 MG capsule Take 37.5 mg by mouth every morning. 1/2 daily      . traZODone (DESYREL) 150 MG tablet Take 150 mg by mouth at bedtime. 1/2 daily       No current facility-administered medications for this visit.    Review of Systems Review of Systems  Constitutional: Negative for fever, chills and unexpected weight change.  HENT: Negative for hearing loss, congestion, sore throat, trouble swallowing and voice change.   Eyes:  Negative for visual disturbance.  Respiratory: Negative for cough and wheezing.   Cardiovascular: Negative for chest pain, palpitations and leg swelling.  Gastrointestinal: Negative for nausea, vomiting, abdominal pain, diarrhea, constipation, blood in stool, abdominal distention and anal bleeding.  Genitourinary: Negative for hematuria, vaginal bleeding and difficulty urinating.  Musculoskeletal: Negative for arthralgias.  Skin: Negative for rash and wound.  Neurological: Negative for seizures, syncope and headaches.  Hematological: Negative for adenopathy. Does not bruise/bleed easily.  Psychiatric/Behavioral: Negative for confusion. The patient is nervous/anxious.     There were no vitals taken for this visit.  Physical Exam Physical Exam   Constitutional: She is oriented to person, place, and time. She appears well-developed and well-nourished. No distress.  HENT:  Head: Normocephalic and atraumatic.  Nose: Nose normal.  Mouth/Throat: No oropharyngeal exudate.  Eyes: Conjunctivae and EOM are normal. Pupils are equal, round, and reactive to light. Left eye exhibits no discharge. No scleral icterus.  Neck: Neck supple. No JVD present. No tracheal deviation present. No thyromegaly present.  Cardiovascular: Normal rate, regular rhythm, normal heart sounds and intact distal pulses.   No murmur heard. Pulmonary/Chest: Effort normal and breath sounds normal. No respiratory distress. She has no wheezes. She has no rales. She exhibits no tenderness.    Small area of bruising and small mobile 1 cm  palpable mass in the lower inner quadrant of the right breast. This is a centimeter or 2 above the inframammary crease and does not appear to involve the inframammary crease or rehabilitation. There is no other mass in either breast. No axillary adenopathy.  Abdominal: Soft. Bowel sounds are normal. She exhibits no distension and no mass. There is no tenderness. There is no rebound and no guarding.  Midline incision well healed  Musculoskeletal: She exhibits no edema and no tenderness.  Lymphadenopathy:    She has no cervical adenopathy.  Neurological: She is alert and oriented to person, place, and time. She exhibits normal muscle tone. Coordination normal.  Skin: Skin is warm. No rash noted. She is not diaphoretic. No erythema. No pallor.  Psychiatric: She has a normal mood and affect. Her behavior is normal. Judgment and thought content normal.    Data Reviewed Imaging studies, histopathology. Discussed in breast conference this morning. Treatment plan coordinated with Dr. Darnelle Catalan and Dr. Michell Heinrich.  Assessment    Invasive ductal carcinoma right breast, lower inner quadrant, clinical stage T1c., N0. Receptor positive,  HER-2-negative.  This small tumor is mobile, and despite her small breast size I think that we can do a reasonable lumpectomy and cosmetic closure. We talked about lumpectomy, sentinel node biopsy, reoperation for margins, reoperation for positive nodes, mastectomy, reconstruction and numerous surgical issues. She would like breast conservation surgery if possible. She and her husband asked that I go ahead and put this on the schedule.  GERD.  Anxiety and depression  History laparoscopic right colectomy for villous adenoma  Negative family history for breast cancer.     Plan    She will be scheduled for right partial mastectomy with needle localization,, right axillary sentinel node biopsy.  I discussed the indications, details, techniques, numerous risk of the surgery with them. They understand the risks of bleeding, infection, reoperation, cosmetic deformity, skin necrosis, or swelling, or numbness, and other unforeseen problems. They understand all these issues. All their questions are answered. They agree with this plan.  They will call me next week if they decide to stay at [facility].  Angelia Mould. Derrell Lolling, M.D., [facility] General and Minimally invasive Surgery Breast and Colorectal Surgery Office:   [phone] Pager:   [phone]  11/03/2012, 12:00 PM

## 2012-11-03 NOTE — Progress Notes (Signed)
Pt request to have her records sent to [facility] for an appt she has scheduled w/ their clinic for a second opinion this Friday.  Faxed records to [facility] at [phone].

## 2012-11-03 NOTE — Progress Notes (Signed)
Radiation Oncology         [phone]) [phone] ________________________________  Initial inpatient Consultation - Date: 11/03/2012   Name: [name] MRN: [id number]   DOB: 05/17/1961  REFERRING PHYSICIAN: Ernestene Mention, MD  DIAGNOSIS: T1cN0 Invasive breast cancer  HISTORY OF PRESENT ILLNESS::[name] is a 52 y.o. female  palpated a right breast mass. She has recently lost 25 pounds under a physician supervised weight loss program. She was sent for rib x-rays at the concern that this is a rib fracture as it was in the very medial inner quadrant. It was difficult at image due to its posterior location. Ultrasound showed a 6 x 7 mm mass at the 5:00 position of the right breast. A biopsy was performed which showed a grade 2 invasive ductal carcinoma which was ER/PR positive HER-2 negative with a Ki-67 of 5%. MRI was performed after her biopsy which showed a 1.3 x 0.8 x 0.7 cm mass with no evidence of chest wall attachment. She has no family history of malignancy. She did have a tubular adenoma removed via a hemicolectomy last year. She is accompanied by her husband.Marland Kitchen  PREVIOUS RADIATION THERAPY: No  PAST MEDICAL HISTORY:  has a past medical history of Asthma; Recurrent upper respiratory infection (URI); GERD (gastroesophageal reflux disease); Depression; Anxiety; Colonic mass; and Breast cancer.    PAST SURGICAL HISTORY: Past Surgical History  Procedure Laterality Date  . Tubal ligation    . Hemicolectomy  07/28/11    FAMILY HISTORY: @[facility] @  SOCIAL HISTORY:  History  Substance Use Topics  . Smoking status: Never Smoker   . Smokeless tobacco: Not on file  . Alcohol Use: Yes    ALLERGIES: Review of patient's allergies indicates no known allergies.  MEDICATIONS:  Current Outpatient Prescriptions  Medication Sig Dispense Refill  . ARIPiprazole (ABILIFY) 5 MG tablet Take 2.5 mg by mouth daily.      Marland Kitchen buPROPion (WELLBUTRIN) 75 MG tablet Take 150 mg by mouth daily.      .  Calcium-Vitamin D-Vitamin K (CALCIUM + D + K PO) Take 1 tablet by mouth daily.      . clonazePAM (KLONOPIN) 0.5 MG tablet Take 0.5 mg by mouth 3 (three) times daily as needed. For anxiety      . Multiple Vitamin (MULITIVITAMIN WITH MINERALS) TABS Take 1 tablet by mouth daily.      Marland Kitchen oxybutynin (DITROPAN-XL) 10 MG 24 hr tablet Take 10 mg by mouth daily.      . phentermine 37.5 MG capsule Take 37.5 mg by mouth every morning. 1/2 daily      . traZODone (DESYREL) 150 MG tablet Take 150 mg by mouth at bedtime. 1/2 daily       No current facility-administered medications for this encounter.    REVIEW OF SYSTEMS:  A 15 point review of systems is documented in the electronic medical record. This was obtained by the nursing staff. However, I reviewed this with the patient to discuss relevant findings and make appropriate changes.  Pertinent items are noted in HPI.   PHYSICAL EXAM: There were no vitals filed for this visit.Marland Kitchen He is a pleasant female in no distress sitting comfortably examining table. She is then. Upon sitting there is skin dimpling in the medial aspect of the right breast associated with her mass. She has small breasts bilaterally. There is no palpable cervical or supraclavicular adenopathy. No palpable axillary adenopathy bilaterally. As no palpable abnormalities of the left breast. There is an approximately  1-2 cm mass in the lower inner quadrant of the right breast along with a very slight amount of bruising. She is alert and oriented x3.  LABORATORY DATA:  Lab Results  Component Value Date   WBC 6.2 11/03/2012   HGB 12.9 11/03/2012   HCT 39.8 11/03/2012   MCV 87.1 11/03/2012   PLT 290 11/03/2012   Lab Results  Component Value Date   NA 142 11/03/2012   K 4.0 11/03/2012   CL 102 11/03/2012   CO2 28 11/03/2012   Lab Results  Component Value Date   ALT 16 11/03/2012   AST 15 11/03/2012   ALKPHOS 53 11/03/2012   BILITOT 0.49 11/03/2012     RADIOGRAPHY: Dg Ribs Unilateral W/chest  Right  10/20/2012   *RADIOLOGY REPORT*  Clinical Data: Palpable mass under right breast.  RIGHT RIBS AND CHEST - 3+ VIEW  Comparison: Chest radiographs 07/22/2011.  Findings: A metallic BB was placed over the palpable concern on the rib images.  The heart size and mediastinal contours are stable. The lungs are clear.  There is no pleural effusion or pneumothorax. No rib fracture or focal rib lesion is identified.  No soft tissue calcification or mass is identified radiographically.  IMPRESSION: No active cardiopulmonary process or chest wall abnormality identified to correspond with the palpable concern.  Clinical follow-up of any palpable concern recommended with consideration of further evaluation by ultrasound as warranted clinically.   Original Report Authenticated By: Carey Bullocks, M.D.   Mr Breast Bilateral W Wo Contrast  11/02/2012   *RADIOLOGY REPORT*  Clinical Data: Recently diagnosed right breast invasive ductal carcinoma.  BILATERAL BREAST MRI WITH AND WITHOUT CONTRAST  Technique: Multiplanar, multisequence MR images of both breasts were obtained prior to and following the intravenous administration of 11ml of MultiHance.  Three dimensional images were evaluated at the independent DynaCad workstation.  Comparison:  Previous mammogram and ultrasound examinations at [facility].  Findings: Minimal background parenchymal enhancement both breasts.  1.3 x 0.8 x 0.7 cm irregular enhancing mass in the lower inner quadrant of the right breast, posteriorly.  This has predominately persistent and plateau enhancement kinetics.  No additional masses or areas of enhancement suspicious for malignancy in either breast.  No abnormal appearing lymph nodes.  IMPRESSION: 1.3 x 0.8 x 0.7 cm biopsy-proven invasive ductal carcinoma in the posterior aspect of the lower inner quadrant of the right breast. Otherwise, unremarkable examination.  RECOMMENDATION: Treatment plan  THREE-DIMENSIONAL MR IMAGE RENDERING ON  INDEPENDENT WORKSTATION:  Three-dimensional MR images were rendered by post-processing of the original MR data on an independent workstation.  The three- dimensional MR images were interpreted, and findings were reported in the accompanying complete MRI report for this study.  BI-RADS CATEGORY 6:  Known biopsy-proven malignancy - appropriate action should be taken.   Original Report Authenticated By: Beckie Salts, M.D.      IMPRESSION: T1 C. N0 invasive ductal carcinoma of the right breast  PLAN: I spoke to the patient and her husband today regarding the equivalency in terms of survival between mastectomy and lumpectomy. We discussed the role of radiation in decreasing local failures in patients who undergo breast conservation. We discussed that her small breast size that may influence her cosmetic outcome and she could elect for a mastectomy poorly so that she can have reconstruction for symmetry issues. She will discuss this further with her surgeon. We discussed possible side effects of treatment including but not limited to skin redness and fatigue. She would  like to be seen by genetic counselor due to her immobile of a tubular adenoma and not due to her family history of which she has 9. We discussed that if she did have a mastectomy it was unlikely she would require radiation. We discussed the indications for postmastectomy radiation including positive margin, tumor size greater than 5 cm and positive lymph nodes. She has none of these risk factors currently. I think she could seek immediate reconstruction. She met with a member of our physical therapist staff as well as our Child psychotherapist and surgery and medical oncology. She is going to [facility] for second opinion on Friday. I did let her know we would be happy to treat her with radiation she received her surgery interim. We also discussed that an Oncotype test would be sent on her surgical specimen. If she did require chemotherapy this would be administered  prior to the start of radiation. She was interested in hearing from our chaplain. Will make that referral.  I spent 40 minutes  face to face with the patient and more than 50% of that time was spent in counseling and/or coordination of care.   ------------------------------------------------  Lurline Hare, MD

## 2012-11-03 NOTE — Patient Instructions (Signed)
You have been diagnosed with a small, invasive breast cancer in the lower inner quadrant of your right breast.  We have discussed numerous options.  You have requested that I go ahead and schedule you for a right partial mastectomy with needle localization and right axillary sentinel node biopsy. Our office will call you tomorrow to finalize these plans.  Please call Dr. Jacinto Halim office if you change her plans after U. Complete your second opinion at [facility] this Friday.    Lumpectomy, Breast Conserving Surgery A lumpectomy is breast surgery that removes only part of the breast. Another name used may be partial mastectomy. The amount removed varies. Make sure you understand how much of your breast will be removed. Reasons for a lumpectomy:  Any solid breast mass.  Grouped significant nodularity that may be confused with a solitary breast mass. Lumpectomy is the most common form of breast cancer surgery today. The surgeon removes the portion of your breast which contains the tumor (cancer). This is the lump. Some normal tissue around the lump is also removed to be sure that all the tumor has been removed.  If cancer cells are found in the margins where the breast tissue was removed, your surgeon will do more surgery to remove the remaining cancer tissue. This is called re-excision surgery. Radiation and/or chemotherapy treatments are often given following a lumpectomy to kill any cancer cells that could possibly remain.  REASONS YOU MAY NOT BE ABLE TO HAVE BREAST CONSERVING SURGERY:  The tumor is located in more than one place.  Your breast is small and the tumor is large so the breast would be disfigured.  The entire tumor removal is not successful with a lumpectomy.  You cannot commit to a full course of chemotherapy, radiation therapy or are pregnant and cannot have radiation.  You have previously had radiation to the breast to treat cancer. HOW A LUMPECTOMY IS PERFORMED If overnight  nursing is not required following a biopsy, a lumpectomy can be performed as a same-day surgery. This can be done in a hospital, clinic, or surgical center. The anesthesia used will depend on your surgeon. They will discuss this with you. A general anesthetic keeps you sleeping through the procedure. LET YOUR CAREGIVERS KNOW ABOUT THE FOLLOWING:  Allergies  Medications taken including herbs, eye drops, over the counter medications, and creams.  Use of steroids (by mouth or creams)  Previous problems with anesthetics or Novocaine.  Possibility of pregnancy, if this applies  History of blood clots (thrombophlebitis)  History of bleeding or blood problems.  Previous surgery  Other health problems BEFORE THE PROCEDURE You should be present one hour prior to your procedure unless directed otherwise.  AFTER THE PROCEDURE  After surgery, you will be taken to the recovery area where a nurse will watch and check your progress. Once you're awake, stable, and taking fluids well, barring other problems you will be allowed to go home.  Ice packs applied to your operative site may help with discomfort and keep the swelling down.  A small rubber drain may be placed in the breast for a couple of days to prevent a hematoma from developing in the breast.  A pressure dressing may be applied for 24 to 48 hours to prevent bleeding.  Keep the wound dry.  You may resume a normal diet and activities as directed. Avoid strenuous activities affecting the arm on the side of the biopsy site such as tennis, swimming, heavy lifting (more than 10 pounds) or  pulling.  Bruising in the breast is normal following this procedure.  Wearing a bra - even to bed - may be more comfortable and also help keep the dressing on.  Change dressings as directed.  Only take over-the-counter or prescription medicines for pain, discomfort, or fever as directed by your caregiver. Call for your results as instructed by your  surgeon. Remember it is your responsibility to get the results of your lumpectomy if your surgeon asked you to follow-up. Do not assume everything is fine if you have not heard from your caregiver. SEEK MEDICAL CARE IF:   There is increased bleeding (more than a small spot) from the wound.  You notice redness, swelling, or increasing pain in the wound.  Pus is coming from wound.  An unexplained oral temperature above 102 F (38.9 C) develops.  You notice a foul smell coming from the wound or dressing. SEEK IMMEDIATE MEDICAL CARE IF:   You develop a rash.  You have difficulty breathing.  You have any allergic problems. Document Released: 07/14/2006 Document Revised: 08/25/2011 Document Reviewed: 10/15/2006 [facility] Patient Information 2014 [place], [place].    Sentinel Lymph Node Biopsy Sentinel lymph node biopsy is a procedure in which a single lymph node is identified, removed, and examined for cancer. Lymph nodes are collections of tissue that help filter infections, cancer cells, and other waste substances from the bloodstream. Certain types of cancer can spread to nearby lymph nodes. The cancer spreads to one lymph node first, and then to others. The first lymph node that your cancer could spread to is called the sentinel lymph node. Examining the sentinel lymph node for cancer can help your caregiver plan future treatment for you. LET YOUR CAREGIVER KNOW ABOUT:   Allergies to food or medicine.  Medicines taken, including vitamins, herbs, eyedrops, over-the-counter medicines, and creams.  Use of steroids (by mouth or creams).  Previous problems with numbing medicines.  History of bleeding problems or blood clots.  Previous surgery.  Other health problems, including diabetes and kidney problems.  Possibility of pregnancy, if this applies. RISKS AND COMPLICATIONS   Infection.  Bleeding.  Allergic reaction to the dye used for the procedure.  Blue staining of the  skin where the dye is injected.  Damaged lymph vessels, causing a buildup of fluid (lymphedema).  Pain or bruising at the biopsy site. BEFORE THE PROCEDURE   Stop smoking at least 2 weeks before the procedure. Not smoking will improve your health after the procedure and decrease the chance of getting a wound infection.  You may have blood tests to make sure your blood clots normally.  Ask your caregiver about changing or stopping your regular medicines.  Do not eat or drink anything for 8 hours before the procedure. PROCEDURE   You will be given medicine that makes you sleep (general anesthetic).  A blue, radioactive dye will be injected near the tumor. The dye will then spread into the sentinel lymph node.  A scanner will identify the sentinel lymph node.  A small cut (incision) will be made, and the sentinel lymph node will be removed.  The sentinel lymph node will be examined in a lab. Sometimes, a sentinel lymph node biopsy is performed during another surgery, such as a mastectomy or lumpectomy for breast cancer.  AFTER THE PROCEDURE   You will go to a recovery room.  You will be monitored for several hours.  If complications do not occur, you will be allowed to go home a few hours  after the procedure.  Your urine may be blue for the next 24 hours. This is normal. It is caused by the dye used during the procedure.  Your skin where the dye was injected may be blue for up to 8 weeks. Document Released: 08/25/2011 Document Reviewed: 08/25/2011 [facility] Patient Information 2013 [place], [place].

## 2012-11-04 ENCOUNTER — Encounter: Payer: Self-pay | Admitting: *Deleted

## 2012-11-04 NOTE — Progress Notes (Signed)
[facility] Psychosocial Distress Screening  Clinical Social Work   Patient completed distress screening protocol, and scored a 9 on the Psychosocial Distress Thermometer which indicates severe distress. Clinical Child psychotherapist met with pt and pt's husband in [facility] to assess for distress and other psychosocial needs. Pt expressed feeling some anxiety associated with her diagnosis, but stated she felt "better" after speaking with the physicians and getting more information on her treatment plan. CSW informed pt of the support team and support services at [facility].  Pt expressed interest in our counseling services, and plans to participate in our support services after her appointments at [facility] are completed.  CSW encouraged pt to call with any questions or concerns.    Tamala Julian, MSW, LCSW  Clinical Social Worker  [facility]  [phone]

## 2012-11-04 NOTE — Progress Notes (Signed)
Received faxed from [place] stating that she needs the Prog panel if ready and any office notes that are ready.  I checked and all that she was requested was ready and released.  Faxed Pathology w/ Prog Panel & all 3 office notes to [facility].

## 2012-11-10 ENCOUNTER — Encounter (HOSPITAL_COMMUNITY): Admission: RE | Admit: 2012-11-10 | Payer: 59 | Source: Ambulatory Visit

## 2012-11-11 ENCOUNTER — Ambulatory Visit (HOSPITAL_COMMUNITY): Admission: RE | Admit: 2012-11-11 | Payer: 59 | Source: Ambulatory Visit | Admitting: General Surgery

## 2012-11-11 ENCOUNTER — Encounter (HOSPITAL_COMMUNITY): Payer: 59

## 2012-11-11 ENCOUNTER — Encounter (HOSPITAL_COMMUNITY): Admission: RE | Payer: Self-pay | Source: Ambulatory Visit

## 2012-11-11 SURGERY — PARTIAL MASTECTOMY WITH NEEDLE LOCALIZATION AND AXILLARY SENTINEL LYMPH NODE BX
Anesthesia: General | Laterality: Right

## 2012-11-12 ENCOUNTER — Telehealth: Payer: Self-pay | Admitting: *Deleted

## 2012-11-12 NOTE — Telephone Encounter (Signed)
Spoke to pt husband about their decision for treatment of med/rad onc to remain with [facility] or [facility].  They would like to have a consult with Dr. Michell Heinrich after surgery.  Surgery is scheduled for 11/17/12.  I sent Clydie Braun the rad onc scheduler a message requesting an appt.  They will f/u with Dr. Michell Heinrich on 12/01/12.  Pt and husband have no further questions or concerns at this time. Encourage pt to call with needs.  Received verbal understanding.  Contact information given.

## 2012-11-30 ENCOUNTER — Encounter: Payer: Self-pay | Admitting: Radiation Oncology

## 2012-11-30 NOTE — Progress Notes (Signed)
Location of Breast Cancer: 10/28/12 biopsy :  Right Breast, far posterior aspect of lower inner quadrant  Diagnosis Breast, right, needle core biopsy - INVASIVE MAMMARY CARCINOMA, grade 2, Histology per pathology report: 10/28/12= Receptor Status: ER(+), PR (+), Her2-neu (-)  Did patient present with symptoms :found after losing 25 lbs ,palpated a mass right breast, 10/27/12 B/L diagnostic mammography= very dense breast,  didn't show any new or worrisome finding,then had U/S =irregular hypoechoic mass  Of  Patients palpation , 7mm,  Past/Anticipated interventions by surgeon, if NWG:NFAOZHYQM 11/11/12 appt  Past/Anticipated interventions by medical oncology, if any: Chemotherapy send for Oncotype DX ,return appt 12/03/12, genetic counseling 12/20/12,  ,2nd opinion scheduled at [facility] June 2014? Lymphedema issues, if any:  NO Pain issues, if any:  Yes, tightness under arm, incision well healed under axilla and breast, slight yellow/grren bruisng on breast    SAFETY ISSUES:  Prior radiation? {NO  Pacemaker/ICD? no  Possible current pregnancy?no  Is the patient on methotrexate? 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 Esmeralda Links, [facility], has appt with oncologist 7./3/14 to discuss oncotype results Nijel Flink, Gloriann Loan, RN 11/30/2012,10:27 AM

## 2012-12-01 ENCOUNTER — Encounter: Payer: Self-pay | Admitting: Radiation Oncology

## 2012-12-01 ENCOUNTER — Ambulatory Visit
Admission: RE | Admit: 2012-12-01 | Discharge: 2012-12-01 | Disposition: A | Discharge status: Home / Self Care | Payer: 59 | Source: Ambulatory Visit | Attending: Radiation Oncology | Admitting: Radiation Oncology

## 2012-12-01 VITALS — BP 98/70 | HR 86 | Temp 98.0°F | Resp 20 | Ht 63.0 in | Wt 126.8 lb

## 2012-12-01 DIAGNOSIS — C50311 Malignant neoplasm of lower-inner quadrant of right female breast: Secondary | ICD-10-CM

## 2012-12-01 DIAGNOSIS — Z170 Estrogen receptor positive status [ER+]: Secondary | ICD-10-CM | POA: Insufficient documentation

## 2012-12-01 DIAGNOSIS — Z79899 Other long term (current) drug therapy: Secondary | ICD-10-CM | POA: Insufficient documentation

## 2012-12-01 DIAGNOSIS — C50319 Malignant neoplasm of lower-inner quadrant of unspecified female breast: Secondary | ICD-10-CM | POA: Insufficient documentation

## 2012-12-01 NOTE — Progress Notes (Signed)
Please see the Nurse Progress Note in the MD Initial Consult Encounter for this patient. 

## 2012-12-01 NOTE — Progress Notes (Signed)
Department of Radiation Oncology  Phone:  [phone] Fax:        [phone]   Name: [name] MRN: [id number]  DOB: Nov 29, 1960  Date: 12/01/2012  Follow Up Visit Note  Diagnosis: T2N0 invasive ductal carcinoma of the right breast  Interval History: [name] presents today for routine followup.  She had her surgery at [facility] on June fourth. This is an invasive adenocarcinoma with a ductal phenotype but prominent lobular features which was grade 2 of 3. It measured 2.5 cm with no lymphovascular invasion and negative margins. Associated ductal carcinoma in situ was present. 0 out of of 4 lymph nodes were positive. The prognostic profile was not repeated but she was ER/PR positive and HER-2 negative with a Ki-67 of 5% initially on biopsy. He is recovered well from her surgery. Her husband is concerned because she has not been working out as much. 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 I have an appointment July 3 at [facility] for results of her Oncotype testing. She would like to cancel her appointment with medical oncology here on Friday as they do not have any new information to discuss at this time. She is interested in hypofractionated radiation. She would like to receive her treatment at the end of the day so that she can continue working.  Allergies: No Known Allergies  Medications:  Current Outpatient Prescriptions  Medication Sig Dispense Refill  . amphetamine-dextroamphetamine (ADDERALL) 15 MG tablet Take 15 mg by mouth daily.      Marland Kitchen buPROPion (WELLBUTRIN) 75 MG tablet Take 150 mg by mouth daily.      . clonazePAM (KLONOPIN) 0.5 MG tablet Take 0.5 mg by mouth 3 (three) times daily as needed for anxiety.       . Multiple Vitamin (MULITIVITAMIN WITH MINERALS) TABS Take 1 tablet by mouth daily.      Marland Kitchen omeprazole (PRILOSEC) 20 MG capsule Take 20 mg by mouth daily. Take 20 mg by mouth nightly.      Marland Kitchen oxybutynin (DITROPAN-XL) 10 MG 24 hr tablet Take 10 mg by mouth daily.      .  phentermine 37.5 MG capsule Take 37.5 mg by mouth every morning. 1/2 daily      . traZODone (DESYREL) 150 MG tablet Take 150 mg by mouth at bedtime. 1/2 daily      . ARIPiprazole (ABILIFY) 5 MG tablet Take 2.5 mg by mouth daily.      . Calcium-Vitamin D-Vitamin K (CALCIUM + D + K PO) Take 1 tablet by mouth daily.       No current facility-administered medications for this encounter.    Physical Exam:  Filed Vitals:   12/01/12 1503  BP: 98/70  Pulse: 86  Temp: 98 F (36.7 C)  Resp: 20   she is a pleasant female in no distress sitting comfortably examining table. She has no lymphedema. She is alert minus x3.  IMPRESSION: [name] is a 52 y.o. female status post breast conservation with Oncotype pending  PLAN:  I discussed with the patient and her husband today the indications for radiation after breast conservation. We discussed the equivalency in terms of survival I again between mastectomy and lumpectomy. We discussed the [place] trial comparing standard fractionation hypofractionation and the equivalency and long-term side effects as well as an acute effects. We discussed the process of simulation the placement tattoos. We discussed the low likelihood of symptomatic rib or lung damage. We discussed the low likelihood of secondary malignancies. She would like to  start her radiation as soon as possible in order to participate in a family trip in August. I placed an order so that she can call and schedule her simulation as soon as she meets with the staff at [facility]. Hopefully weekly get things started the second week or third week of July. She has signed informed consent. I've given her a copy of this consent form. I will ask medical oncology to reschedule her appointment for the week of July 7.    Lurline Hare, MD

## 2012-12-02 ENCOUNTER — Telehealth: Payer: Self-pay | Admitting: *Deleted

## 2012-12-02 NOTE — Telephone Encounter (Signed)
Spoke to pts husband.  They request for [name] to be seen after she has received her oncotype results from [place].  Her results will be back on 12/16/12.  Appt for 12/03/12 has been cancelled.  Pt request new appt date with Dr. Darnelle Catalan.  Informed we will get her r/s the wee of July 7th for Dr. Darnelle Catalan to discuss oncotype dx testing results and his recommendations for treatment.  Received verbal understanding.

## 2012-12-03 ENCOUNTER — Telehealth: Payer: Self-pay | Admitting: *Deleted

## 2012-12-03 ENCOUNTER — Encounter: Payer: 59 | Admitting: Oncology

## 2012-12-03 NOTE — Telephone Encounter (Signed)
Lm gave appt for 7/11@11am . madept aware that i will mail a letter/cal as well...td

## 2012-12-14 ENCOUNTER — Telehealth: Payer: Self-pay | Admitting: *Deleted

## 2012-12-14 NOTE — Telephone Encounter (Signed)
Confirmed genetics and f/u appt with for 12/20/12 for genetics and 12/24/12 with Dr. Darnelle Catalan.  No further needs voiced at this time.

## 2012-12-16 ENCOUNTER — Telehealth: Payer: Self-pay | Admitting: *Deleted

## 2012-12-16 NOTE — Telephone Encounter (Signed)
Pt husband called to cancel genetics appt d/t have appt with [facility] on 12/20/12.  Katharine Look request to cancel genetics appt and to r/s new genetic appt.  Informed Mr. Wintle that Misty Stanley will call with a new appt date and time.  Confirmed appt with Dr. Darnelle Catalan on 12/24/12.

## 2012-12-20 ENCOUNTER — Encounter: Payer: Self-pay | Admitting: Genetic Counselor

## 2012-12-20 ENCOUNTER — Other Ambulatory Visit: Payer: 59 | Admitting: Lab

## 2012-12-20 ENCOUNTER — Ambulatory Visit (HOSPITAL_BASED_OUTPATIENT_CLINIC_OR_DEPARTMENT_OTHER): Payer: 59 | Admitting: Genetic Counselor

## 2012-12-20 DIAGNOSIS — Z800 Family history of malignant neoplasm of digestive organs: Secondary | ICD-10-CM

## 2012-12-20 DIAGNOSIS — Z801 Family history of malignant neoplasm of trachea, bronchus and lung: Secondary | ICD-10-CM

## 2012-12-20 DIAGNOSIS — C50319 Malignant neoplasm of lower-inner quadrant of unspecified female breast: Secondary | ICD-10-CM

## 2012-12-20 DIAGNOSIS — C50311 Malignant neoplasm of lower-inner quadrant of right female breast: Secondary | ICD-10-CM

## 2012-12-20 DIAGNOSIS — IMO0002 Reserved for concepts with insufficient information to code with codable children: Secondary | ICD-10-CM

## 2012-12-20 NOTE — Progress Notes (Signed)
Dr.  Raymond Gurney Magrinat requested a consultation for genetic counseling and risk assessment for [name], a 52 y.o. female, for discussion of her personal history of breast cancer and family history of pancreatic, colon, lung and testicular cancer.  She presents to clinic today to discuss the possibility of a genetic predisposition to cancer, and to further clarify her risks, as well as her family members' risks for cancer.   HISTORY OF PRESENT ILLNESS: In 2014, at the age of 78, [name] was diagnosed with invasive ductal carcinoma of the right breast. This was treated with lumpectomy.  She is currently waiting on the Oncotype DX testing to be completed to learn whether she needs chemotherapy, and therefore radiation has been delayed.  Her tumor is ER+/PR+/Her2-.  In 2012 she was found to have a large tuberous adenoma that was removed.  She is scheduled for a colonoscopy within 2 years.  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   Past Medical History  Diagnosis Date  . Asthma     as a child, early adult...none now  . Recurrent upper respiratory infection (URI)     started 1/28.....getting better  . GERD (gastroesophageal reflux disease)     occas  at  night....twice a  month--otc meds  . Depression   . Anxiety   . Colonic mass     ascending  . Breast cancer 10/28/12    right breast    Past Surgical History  Procedure Laterality Date  . Tubal ligation    . Hemicolectomy  07/28/11  . Breast biopsy Right 10/28/12    Invasive mammary ca, ER/PR=+, Her2 Neu-  . Appendectomy      History   Social History  . Marital Status: Married    Spouse Name: [name]    Number of Children: 2  . Years of Education: N/A   Occupational History  .     Social History Main Topics  . Smoking status: Never Smoker   . Smokeless tobacco: Never Used  . Alcohol Use: Yes     Comment: wine occasionally  . Drug  Use: No  . Sexually Active: Yes    Birth Control/ Protection: Surgical   Other Topics Concern  . None   Social History Narrative  . None    REPRODUCTIVE HISTORY AND PERSONAL RISK ASSESSMENT FACTORS: Menarche was at age 65.   perimenopausal Uterus Intact: yes Ovaries Intact: yes G2P2A0, first live birth at age 52  She has previously undergone treatment for infertility.  She took Clomid for her first pregnany.  She became pregnant within one cycle of clomid. Oral Contraceptive use: up to 20 years   She has not used HRT in the past.    FAMILY HISTORY:  We obtained a detailed, 4-generation family history.  Significant diagnoses are listed below: Family History  Problem Relation Age of Onset  . Heart disease Father   . Cancer Paternal Aunt     unknown form of cancer  . Lung cancer Maternal Grandmother   . Testicular cancer Cousin     paternal cousin; died in 43s  . Pancreatic cancer Cousin 40  . Colon cancer Other     paternal grandmother's sister  . Cancer Other     MGM's sister  The patient does not have a lot of family history infomration as she moved to the [place] when she was 33.  She has four sisters and one brother, none of whom have cancer.  She thinks that her father may have had prostate cancer, but is unsure.  He had six sisters and three brothers.   One sister had an unknown form of cancer.  The patient' reports that one uncle had a son whose testicles never descended and he developed testicular cancer in his 79s-30s and died.  Another uncle had a son who had pancreatic cancer at age 30.  Her paternal grandmother's sister had colon cancer later in life.  The patient's mother is alive and never diagnosed with cancer.  She had one sister and four brothers none of whom had cancer.  Her matenral grandmother had lung cancer, and her grandmother's sister had an unknown form of cancer.  Patient's maternal ancestors are of [place] and [place] descent, and paternal ancestors are of  [place] and [place] descent. There is no reported Ashkenazi Jewish ancestry. There is no known consanguinity.  GENETIC COUNSELING RISK ASSESSMENT, DISCUSSION, AND SUGGESTED FOLLOW UP: We reviewed the natural history and genetic etiology of sporadic, familial and hereditary cancer syndromes.  About 5-10% of breast cancer is hereditary.  Of this, about 85% is the result of a BRCA1 or BRCA2 mutation.  We reviewed the characteristics, features and inheritance patterns of hereditary cancer syndromes. We also discussed genetic testing, including the appropriate family members to test, the process of testing, insurance coverage and turn-around-time for results. At this time, [name] does not meet the medical criteria for doing genetic testing for breast cancer based on [employer]'s medical policy.  [name] comes from a large family with many unaffected female relatives.  The patient's personal history of breast cancer and family history of cancer is suggestive of the following possible diagnosis: sporadic cancer  In order to estimate her chance of having a BRCA mutation, we used statistical models (Penn II) and laboratory data that take into account her personal medical history, family history and ancestry.  Because each model is different, there can be a lot of variability in the risks they give.  Therefore, these numbers must be considered a rough range and not a precise risk of having a BRCA mutation.  These models estimate that she has approximately a 6-8% chance of having a mutation. Based on this assessment of her family and personal history, genetic testing is no recommended.  After considering the risks, benefits, and limitations, the patient decided to talk with family members to determine if there is additional family history that would change our assessment.  The patient was given my card to contact me in case she uncovered other family history or if she decides to pursue genetic testing  and pay out of pocket.   The patient was seen for a total of 60 minutes, greater than 50% of which was spent face-to-face counseling.  This plan is being carried out per Dr. Ruthann Cancer recommendations.  This note will also be sent to the referring provider via the electronic medical record. The patient will be supplied with a summary of this genetic counseling discussion as well as educational information on the discussed hereditary cancer syndromes following the conclusion of their visit.   Patient was discussed with Dr. Drue Second.   _______________________________________________________________________ For Office Staff:  Number of people involved in session: 2 Was an Intern/ student involved with case: yes

## 2012-12-21 ENCOUNTER — Telehealth: Payer: Self-pay | Admitting: *Deleted

## 2012-12-21 NOTE — Telephone Encounter (Signed)
Called pt with new appt date and time per Dr. Darnelle Catalan.  12/30/12 at 5:00pm.  Dr Darnelle Catalan unable to do 7/18 d/t CME. Received verbal confirmation.  Pt denies further needs at this time.

## 2012-12-21 NOTE — Telephone Encounter (Signed)
Informed husband of new appt date and time of f/u appt with Dr. Darnelle Catalan on 12/31/12 at 0900.  R/S appt d/t oncotype results will not be back by 12/28/12.

## 2012-12-24 ENCOUNTER — Ambulatory Visit: Payer: 59 | Admitting: Oncology

## 2012-12-28 ENCOUNTER — Ambulatory Visit: Payer: 59 | Admitting: Radiation Oncology

## 2012-12-28 ENCOUNTER — Encounter: Payer: Self-pay | Admitting: *Deleted

## 2012-12-28 NOTE — Progress Notes (Signed)
Mailed after appt letter to pt. 

## 2012-12-29 ENCOUNTER — Telehealth: Payer: Self-pay | Admitting: *Deleted

## 2012-12-29 DIAGNOSIS — C50319 Malignant neoplasm of lower-inner quadrant of unspecified female breast: Secondary | ICD-10-CM

## 2012-12-29 NOTE — Telephone Encounter (Signed)
Pt husband called and stated pt will receive chemo at [facility] on 12/30/12.  Appt with Dr. Darnelle Catalan cancelled on 12/30/12.  Pt scheduled to see Dr. Darnelle Catalan on 01/07/13 at 1000/ labs 0930.  Informed husband of new appt date and time.  Received verbal understanding.

## 2012-12-30 ENCOUNTER — Ambulatory Visit: Payer: 59 | Admitting: Oncology

## 2012-12-31 ENCOUNTER — Ambulatory Visit: Payer: 59 | Admitting: Oncology

## 2013-01-06 ENCOUNTER — Telehealth: Payer: Self-pay | Admitting: *Deleted

## 2013-01-06 NOTE — Telephone Encounter (Signed)
Received call from pt husband Herald.  Herald relate that Lakindra will continue chemotherapy at [facility] d/t they required them to continue chemo once they started it at their facility.  Amely still plans to keep her appt with Dr. Darnelle Catalan on 01/07/13.  She also is going to maintain her radiation therapy at [facility].  Encourage pt husband to call with further needs.  Received verbal understanding.

## 2013-01-07 ENCOUNTER — Telehealth: Payer: Self-pay | Admitting: *Deleted

## 2013-01-07 ENCOUNTER — Other Ambulatory Visit (HOSPITAL_BASED_OUTPATIENT_CLINIC_OR_DEPARTMENT_OTHER): Payer: 59 | Admitting: Lab

## 2013-01-07 ENCOUNTER — Encounter: Payer: Self-pay | Admitting: *Deleted

## 2013-01-07 ENCOUNTER — Other Ambulatory Visit: Payer: Self-pay | Admitting: Radiology

## 2013-01-07 ENCOUNTER — Ambulatory Visit (HOSPITAL_BASED_OUTPATIENT_CLINIC_OR_DEPARTMENT_OTHER): Payer: 59 | Admitting: Oncology

## 2013-01-07 VITALS — BP 98/67 | HR 88 | Temp 98.3°F | Resp 20 | Ht 63.0 in | Wt 124.6 lb

## 2013-01-07 DIAGNOSIS — C50319 Malignant neoplasm of lower-inner quadrant of unspecified female breast: Secondary | ICD-10-CM

## 2013-01-07 DIAGNOSIS — C50311 Malignant neoplasm of lower-inner quadrant of right female breast: Secondary | ICD-10-CM

## 2013-01-07 LAB — CBC WITH DIFFERENTIAL/PLATELET
Basophils Absolute: 0.1 10*3/uL (ref 0.0–0.1)
EOS%: 1.4 % (ref 0.0–7.0)
HGB: 11.4 g/dL — ABNORMAL LOW (ref 11.6–15.9)
LYMPH%: 30.4 % (ref 14.0–49.7)
MCH: 29 pg (ref 25.1–34.0)
MCV: 84.9 fL (ref 79.5–101.0)
MONO%: 14.8 % — ABNORMAL HIGH (ref 0.0–14.0)
Platelets: 241 10*3/uL (ref 145–400)
RBC: 3.91 10*6/uL (ref 3.70–5.45)
RDW: 13 % (ref 11.2–14.5)

## 2013-01-07 LAB — COMPREHENSIVE METABOLIC PANEL (CC13)
AST: 13 U/L (ref 5–34)
Albumin: 3.4 g/dL — ABNORMAL LOW (ref 3.5–5.0)
Alkaline Phosphatase: 61 U/L (ref 40–150)
BUN: 13.6 mg/dL (ref 7.0–26.0)
Potassium: 3.9 mEq/L (ref 3.5–5.1)
Total Bilirubin: 0.21 mg/dL (ref 0.20–1.20)

## 2013-01-07 MED ORDER — LORATADINE 10 MG PO TABS: ORAL_TABLET | ORAL | Status: DC

## 2013-01-07 MED ORDER — LIDOCAINE-PRILOCAINE 2.5-2.5 % EX CREA: TOPICAL_CREAM | CUTANEOUS | Status: DC | PRN

## 2013-01-07 NOTE — Telephone Encounter (Signed)
sw with pt informed her that i added labs to her tx on 01/13/13@9am  and  01/20/13 for 9:15, ov @ 9:45, and tx to follow. i also made her aware of her other appts . Pt weill get avs on 01/13/13...td

## 2013-01-07 NOTE — Progress Notes (Signed)
ID: [name] OB: 02-Feb-1961  MR#: [id number]  CSN#:[id number]  PCP: Elizabeth Found, MD GYN:   [name]]; [name] OTHER MD: [name], [name], [name]   HISTORY OF PRESENT ILLNESS: [name] had a routine colonoscopy under Dr. Dulce Gonzalez approximately a year and a half ago, showing a right colon lesion which could not be removed intraoperatively. CT scans of the abdomen and pelvis 07/07/2011 were negative except for the question of a soft tissue lesion in the right colon, and particularly there was no adenopathy or evidence of metastatic disease. She underwent partial colectomy under Dr. Lodema Gonzalez 07/28/2011, and the pathology from that procedure ([id number]) showed a 3.2 cm tubular adenoma without high-grade dysplasia or malignancy. Margins were negative. 0 of 16 lymph nodes were involved.  And the patient has been working at weight loss and managed to lose approximately 25 pounds over the past year. She feels this is what allowed her to palpate a mass in her right breast earlier this month. (She had had negative mammographic screening 02/26/2012). On 10/27/2012 the patient underwent bilateral diagnostic mammography. The breasts are "extremely dense". Mammography did not show any new or worrisome finding. Right breast ultrasound however located an irregular hypoechoic mass in the area of the patient's palpable abnormality. It measured approximately 7 mm. Biopsy of this mass 10/28/2012 showed an invasive ductal carcinoma, grade 2, estrogen and progesterone receptor positive, with no HER-2 amplification, and an MIB-1 of 5%.  Bilateral breast MRI 11/01/2012 Gonzalez a 1.3 cm irregular enhancing mass in the lower inner quadrant of the right breast, with no other masses of concern in either breast and no abnormal appearing lymph nodes.  The patient's subsequent history is as detailed below.   INTERVAL HISTORY: [name] returns today for  followup of her breast cancer accompanied by her husband [name]. Since her last visit here [name] at [facility], showing a 2.5 cm invasive ductal carcinoma with lobular features, grade 2, and a total of 6 sentinel lymph nodes removed, all negative. Margins were cleared intraoperatively. She then met with [name] at [facility] and after an appropriate discussion of her high Oncotype score chemotherapy was started 12/30/2012 with cyclophosphamide and docetaxel. She is now day 9 cycle 1.  REVIEW OF SYSTEMS: She did well with the chemotherapy in terms of nausea and vomiting. She did develop mild phlebitis (she did not have a port placed). She felt fatigued for 3 or 4 days but is recovering from that. She has not yet begun to lose her hair. She had bony aches after the Neulasta, lasting approximately 48 hours. Otherwise a detailed review of systems today was noncontributory  PAST MEDICAL HISTORY: Past Medical History  Diagnosis Date  . Asthma     as a child, early adult...none now  . Recurrent upper respiratory infection (URI)     started 1/28.....getting better  . GERD (gastroesophageal reflux disease)     occas  at  night....twice a  month--otc meds  . Depression   . Anxiety   . Colonic mass     ascending  . Breast cancer 10/28/12    right breast    PAST SURGICAL HISTORY: Past Surgical History  Procedure Laterality Date  . Tubal ligation    . Hemicolectomy  07/28/11  . Breast biopsy Right 10/28/12    Invasive mammary ca, ER/PR=+, Her2 Neu-  . Appendectomy      FAMILY HISTORY Family History  Problem Relation Age of Onset  .  Heart disease Father   . Cancer Paternal Aunt     unknown form of cancer  . Lung cancer Maternal Grandmother   . Testicular cancer Cousin     paternal cousin; died in 21s  . Pancreatic cancer Cousin 40  . Colon cancer Other     paternal grandmother's sister  . Cancer Other     MGM's sister   the patient's father died from congestive far to failure at the  age of 12. The patient's mother is still living, in her early 84s. The patient has one brother, 4 sisters. There is no history of breast or ovarian cancer in the family to the patient's knowledge.  GYNECOLOGIC HISTORY:  Menarche age 34, first live birth age 69. The patient's less. It was September 2013 and the one before that was March 2013. She is not taking hormone replacement. She did take oral contraceptives for several years intermittently in the past. There were no complications.   SOCIAL HISTORY:  [name] is originally from [place]. 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 She is not a counselor herself, but her husband, [name] (goes by "[name]") works as a Veterinary surgeon for the [employer] as well as having his own clinic in [place]. He is originally from [place]. The patient's children are [name], who lives in [place] and has a Scientist, water quality in counseling; and [name], who is a Consulting civil engineer at [employer] He lives with the patient, and shares custody of his daughter, who is 42 years old and spends every other weekend at the patient's home.    ADVANCED DIRECTIVES: In place   HEALTH MAINTENANCE: History  Substance Use Topics  . Smoking status: Never Smoker   . Smokeless tobacco: Never Used  . Alcohol Use: Yes     Comment: wine occasionally     Colonoscopy: 06/02/2011 / [facility]  PAP:  Bone density:  Lipid panel:  No Known Allergies  Current Outpatient Prescriptions  Medication Sig Dispense Refill  . amphetamine-dextroamphetamine (ADDERALL) 15 MG tablet Take 15 mg by mouth daily.      . ARIPiprazole (ABILIFY) 5 MG tablet Take 2.5 mg by mouth daily.      Marland Kitchen buPROPion (WELLBUTRIN) 75 MG tablet Take 150 mg by mouth daily.      . Calcium-Vitamin D-Vitamin K (CALCIUM + D + K PO) Take 1 tablet by mouth daily.      . clonazePAM (KLONOPIN) 0.5 MG tablet Take 0.5 mg by mouth 3 (three) times daily as needed for anxiety.        . Multiple Vitamin (MULITIVITAMIN WITH MINERALS) TABS Take 1 tablet by mouth daily.      Marland Kitchen omeprazole (PRILOSEC) 20 MG capsule Take 20 mg by mouth daily. Take 20 mg by mouth nightly.      Marland Kitchen oxybutynin (DITROPAN-XL) 10 MG 24 hr tablet Take 10 mg by mouth daily.      . phentermine 37.5 MG capsule Take 37.5 mg by mouth every morning. 1/2 daily      . traZODone (DESYREL) 150 MG tablet Take 150 mg by mouth at bedtime. 1/2 daily       No current facility-administered medications for this visit.    OBJECTIVE: Middle-aged Latin American woman in no acute distress Filed Vitals:   01/07/13 0934  BP: 98/67  Pulse: 88  Temp: 98.3 F (36.8 C)  Resp: 20     Body mass index is 22.08 kg/(m^2).    ECOG FS: 1  Sclerae  unicteric Oropharynx clear No cervical or supraclavicular adenopathy Lungs no rales or rhonchi Heart regular rate and rhythm Abd soft, nontender, positive bowel sounds, no masses palpated MSK no focal spinal tenderness, no peripheral edema Neuro: nonfocal, well oriented, appropriate affect Breasts: The right breast is status post lumpectomy and sentinel lymph node sampling. The incisions are healing nicely, without erythema, swelling, dehiscence, or unusual tenderness. The cosmetic result is excellent.. The right axilla is benign. The left breast is unremarkable.   LAB RESULTS:  CMP     Component Value Date/Time   NA 142 11/03/2012 0821   NA 139 07/30/2011 0615   K 4.0 11/03/2012 0821   K 4.1 07/30/2011 0615   CL 102 11/03/2012 0821   CL 104 07/30/2011 0615   CO2 28 11/03/2012 0821   CO2 25 07/30/2011 0615   GLUCOSE 93 11/03/2012 0821   GLUCOSE 87 07/30/2011 0615   BUN 14.3 11/03/2012 0821   BUN 5* 07/30/2011 0615   CREATININE 0.8 11/03/2012 0821   CREATININE 0.68 07/30/2011 0615   CALCIUM 9.7 11/03/2012 0821   CALCIUM 8.8 07/30/2011 0615   PROT 7.6 11/03/2012 0821   PROT 7.9 07/22/2011 0845   ALBUMIN 4.2 11/03/2012 0821   ALBUMIN 4.0 07/22/2011 0845   AST 15 11/03/2012 0821   AST  14 07/22/2011 0845   ALT 16 11/03/2012 0821   ALT 14 07/22/2011 0845   ALKPHOS 53 11/03/2012 0821   ALKPHOS 56 07/22/2011 0845   BILITOT 0.49 11/03/2012 0821   BILITOT 0.3 07/22/2011 0845   GFRNONAA >90 07/30/2011 0615   GFRAA >90 07/30/2011 0615    I No results Gonzalez for this basename: SPEP,  UPEP,   kappa and lambda light chains    Lab Results  Component Value Date   WBC 8.5 01/07/2013   NEUTROABS 4.5 01/07/2013   HGB 11.4* 01/07/2013   HCT 33.2* 01/07/2013   MCV 84.9 01/07/2013   PLT 241 01/07/2013      Chemistry      Component Value Date/Time   NA 142 11/03/2012 0821   NA 139 07/30/2011 0615   K 4.0 11/03/2012 0821   K 4.1 07/30/2011 0615   CL 102 11/03/2012 0821   CL 104 07/30/2011 0615   CO2 28 11/03/2012 0821   CO2 25 07/30/2011 0615   BUN 14.3 11/03/2012 0821   BUN 5* 07/30/2011 0615   CREATININE 0.8 11/03/2012 0821   CREATININE 0.68 07/30/2011 0615      Component Value Date/Time   CALCIUM 9.7 11/03/2012 0821   CALCIUM 8.8 07/30/2011 0615   ALKPHOS 53 11/03/2012 0821   ALKPHOS 56 07/22/2011 0845   AST 15 11/03/2012 0821   AST 14 07/22/2011 0845   ALT 16 11/03/2012 0821   ALT 14 07/22/2011 0845   BILITOT 0.49 11/03/2012 0821   BILITOT 0.3 07/22/2011 0845       No results Gonzalez for this basename: LABCA2    No components Gonzalez with this basename: LABCA125    No results Gonzalez for this basename: INR,  in the last 168 hours  Urinalysis No results Gonzalez for this basename: colorurine,  appearanceur,  labspec,  phurine,  glucoseu,  hgbur,  bilirubinur,  ketonesur,  proteinur,  urobilinogen,  nitrite,  leukocytesur    STUDIES: The surgical and pathologic reports from [facility] were reviewed today.  ASSESSMENT: 52 y.o. [place] woman status post right breast lower inner quadrant biopsy 10/28/2012 for a clinical T1c N0, stage IA invasive ductal carcinoma, grade 2, estrogen  and progesterone receptor positive, HER-2 not amplified, with an MIB-1 of 20%  (1) s/p Right lumpectomy and sentinel lymph  node sampling 11/17/2012 for a pT2 pN0, stage IIA invasive ductal carcinoma with prominent lobular features, grade 2, with initially positive  margins cleared intraoperatively; estrogen receptor positive with an Allred score of 7, progesterone receptor positive with an Allred score of 8, HercepTest 0, proliferation index by ACIS III 32% ([id number])   (2) Oncotype DX score of 33 predicting a risk of distant recurrence within 10 years of 23% if her only systemic treatment is tamoxifen for 5 years; the estrogen receptor was interpreted as negative on the Oncotype report  (3) adjuvant cyclophosphamide and docetaxel started 12/30/2012, with Neulasta support   PLAN: We reviewed Joshalyn's situation in detail today. We went over her Oncotype report, which described her tumor as estrogen receptor negative. The estrogen receptor determination from Oncotype is not standard, biologically it is not sensible to have a strong progesterone receptor positivity with a negative estrogen receptor, and finally both her biopsy here and her final surgery at [facility] showed strong estrogen receptor positivity by the standard accepted method. Accordingly the negative estrogen receptor from Oncotype should simply be discounted.  We discussed her prognosis, and in general chemotherapy reduces the risk of recurrence by approximately 1/3, which means she would get an approximately 8% risk reduction from her chemotherapy. That would bring her risk down from 23% to 15%. However the Oncotype recurrence prediction is based on 5 years of tamoxifen, which we know is inferior to multiple other choices. Those choices (whether involving a longer period of tamoxifen or a mixture of tamoxifen and aromatase inhibitors) generally reduce the risk of recurrence an additional 3%.  Accordingly I quoted her a final risk of recurrence in the 12% range. This means she would have an 88% chance of this tumor not recurring at a distant site within the next  10 years.  We discussed continuing to receive her final 3 cycles of chemotherapy peripherally, having a PICC line placed, or having a port placed. At this point, with the phlebitis she experienced with her first cycle, she is very much in favor of having a port placed and this is being operationalized for next week. I have written her a prescription for EMLA cream. I also wrote her for Claritin to take around the time of her Neulasta, which should help with the aches and pains related to that agent.  Tyia is very pleased with the surgical and medical oncology treatments she received at [facility]. She wishes to switch her care back to our cancer center because of convenience and not because of any dissatisfaction with the [facility] team. She understands she will receive exactly the same drugs here and indeed there is no particular reason for her to receive this standard treatment at a distant site.  Her counts today are excellent, and I am going to obtain labwork alone next week just to make sure there is no dip in that time interval. She is scheduled for port placement next week and for "chemotherapy school" of the following week. She will see [place] again in August 7, which will be the date of cycle #2. I have also set her up to meet with our dietitian for a general discussion of diet issues. She knows to call for any problems that may develop before her next visit here.  Lowella Dell, MD   01/07/2013 9:40 AM

## 2013-01-07 NOTE — Progress Notes (Signed)
Pt request nutrition consult.  Referral placed in EPIC.  Discussed EMLA cream placement and use.  Gave contact information.  No needs voiced at this time.

## 2013-01-07 NOTE — Telephone Encounter (Signed)
i emailed MW to adjust tx for 01/20/13...td

## 2013-01-07 NOTE — Telephone Encounter (Signed)
sw pt gv appt d/t for 01/18/13@ 5pm for chemo edu...td

## 2013-01-09 DIAGNOSIS — C50319 Malignant neoplasm of lower-inner quadrant of unspecified female breast: Secondary | ICD-10-CM | POA: Insufficient documentation

## 2013-01-10 NOTE — Addendum Note (Signed)
Addended by: Billey Co on: 01/10/2013 12:33 PM   Modules accepted: Orders, Medications

## 2013-01-11 ENCOUNTER — Encounter (HOSPITAL_COMMUNITY): Payer: Self-pay | Admitting: Pharmacy Technician

## 2013-01-11 ENCOUNTER — Telehealth: Payer: Self-pay | Admitting: *Deleted

## 2013-01-11 NOTE — Telephone Encounter (Signed)
Per staff message I have adjusted 8/28 appt

## 2013-01-12 ENCOUNTER — Ambulatory Visit (HOSPITAL_COMMUNITY)
Admission: RE | Admit: 2013-01-12 | Discharge: 2013-01-12 | Disposition: A | Discharge status: Home / Self Care | Payer: 59 | Source: Ambulatory Visit | Attending: Oncology | Admitting: Oncology

## 2013-01-12 ENCOUNTER — Other Ambulatory Visit: Payer: Self-pay | Admitting: Oncology

## 2013-01-12 ENCOUNTER — Encounter (HOSPITAL_COMMUNITY): Payer: Self-pay

## 2013-01-12 ENCOUNTER — Other Ambulatory Visit: Payer: Self-pay | Admitting: Physician Assistant

## 2013-01-12 DIAGNOSIS — C50311 Malignant neoplasm of lower-inner quadrant of right female breast: Secondary | ICD-10-CM

## 2013-01-12 DIAGNOSIS — K219 Gastro-esophageal reflux disease without esophagitis: Secondary | ICD-10-CM | POA: Insufficient documentation

## 2013-01-12 DIAGNOSIS — Z79899 Other long term (current) drug therapy: Secondary | ICD-10-CM | POA: Insufficient documentation

## 2013-01-12 DIAGNOSIS — C50919 Malignant neoplasm of unspecified site of unspecified female breast: Secondary | ICD-10-CM | POA: Insufficient documentation

## 2013-01-12 LAB — PROTIME-INR
INR: 0.97 (ref 0.00–1.49)
Prothrombin Time: 12.7 seconds (ref 11.6–15.2)

## 2013-01-12 MED ORDER — HEPARIN SOD (PORK) LOCK FLUSH 100 UNIT/ML IV SOLN: 500.0000 [IU] | Freq: Once | INTRAVENOUS | Status: DC

## 2013-01-12 MED ORDER — SODIUM CHLORIDE 0.9 % IV SOLN
Freq: Once | INTRAVENOUS | Status: AC
  Administered 2013-01-12: 20 mL/h via INTRAVENOUS

## 2013-01-12 MED ORDER — CEFAZOLIN SODIUM-DEXTROSE 2-3 GM-% IV SOLR
2.0000 g | Freq: Once | INTRAVENOUS | Status: AC
  Administered 2013-01-12: 2 g via INTRAVENOUS
  Filled 2013-01-12: qty 50

## 2013-01-12 MED ORDER — MIDAZOLAM HCL 2 MG/2ML IJ SOLN
INTRAMUSCULAR | Status: AC | PRN
  Administered 2013-01-12: 2 mg via INTRAVENOUS

## 2013-01-12 MED ORDER — FENTANYL CITRATE 0.05 MG/ML IJ SOLN
INTRAMUSCULAR | Status: AC
  Filled 2013-01-12: qty 4

## 2013-01-12 MED ORDER — MIDAZOLAM HCL 2 MG/2ML IJ SOLN
INTRAMUSCULAR | Status: AC
  Filled 2013-01-12: qty 4

## 2013-01-12 MED ORDER — FENTANYL CITRATE 0.05 MG/ML IJ SOLN
INTRAMUSCULAR | Status: AC | PRN
  Administered 2013-01-12: 100 ug via INTRAVENOUS

## 2013-01-12 NOTE — Procedures (Signed)
Successful placement of left IJ approach port-a-cath with tip at the superior caval atrial junction. The catheter is ready for immediate use. No immediate post procedural complications. 

## 2013-01-12 NOTE — H&P (Signed)
Chief Complaint: "I am here for a port placement for my chemotherapy." Referring Physician: Dr. Darnelle Catalan HPI: [name] is an 52 y.o. female with pmhx of right breast cancer s/p lumpectomy. Patient did experience phlebitis with her first treatment and she would like a port catheter placed. Patient denies any recent illness, fever or chills. She denies any chest pain or shortness of breath. She denies any blood in her stool or blood in her urine.   Past Medical History:  Past Medical History  Diagnosis Date  . Asthma     as a child, early adult...none now  . Recurrent upper respiratory infection (URI)     started 1/28.....getting better  . GERD (gastroesophageal reflux disease)     occas  at  night....twice a  month--otc meds  . Depression   . Anxiety   . Colonic mass     ascending  . Breast cancer 10/28/12    right breast    Past Surgical History:  Past Surgical History  Procedure Laterality Date  . Tubal ligation    . Hemicolectomy  07/28/11  . Breast biopsy Right 10/28/12    Invasive mammary ca, ER/PR=+, Her2 Neu-  . Appendectomy      Family History:  Family History  Problem Relation Age of Onset  . Heart disease Father   . Cancer Paternal Aunt     unknown form of cancer  . Lung cancer Maternal Grandmother   . Testicular cancer Cousin     paternal cousin; died in 57s  . Pancreatic cancer Cousin 40  . Colon cancer Other     paternal grandmother's sister  . Cancer Other     MGM's sister    Social History:  reports that she has never smoked. She has never used smokeless tobacco. She reports that  drinks alcohol. She reports that she does not use illicit drugs.  Allergies: No Known Allergies    Medication List    ASK your doctor about these medications       amphetamine-dextroamphetamine 15 MG 24 hr capsule  Commonly known as:  ADDERALL XR  Take 15 mg by mouth every morning.     buPROPion 150 MG 24 hr tablet  Commonly known as:  WELLBUTRIN XL  Take 150  mg by mouth every morning.     clindamycin-benzoyl peroxide gel  Commonly known as:  BENZACLIN  Apply 1 application topically at bedtime as needed (for face).     clonazePAM 0.5 MG tablet  Commonly known as:  KLONOPIN  Take 0.5 mg by mouth 3 (three) times daily as needed for anxiety.     dexamethasone 4 MG tablet  Commonly known as:  DECADRON  Take 8 mg (2 x 4mg  tablets) by mouth at 8am & 5pm 1 day BEFORE chemo, 8am & 5pm on Day 2, 8am on Day 3, then as directed.     ibuprofen 200 MG tablet  Commonly known as:  ADVIL,MOTRIN  Take 200 mg by mouth every 6 (six) hours as needed for pain.     lidocaine-prilocaine cream  Commonly known as:  EMLA  Apply topically as needed. Apply over port site 2 hours before chemo; cover with saran wrap or similar     loratadine 10 MG tablet  Commonly known as:  CLARITIN  Take one tablet by mouth the morning of neulasta (day 2 chemo) and the next 3 days     NEULASTA 6 MG/0.6ML injection  Generic drug:  pegfilgrastim  Inject 6 mg  into the skin once. Day after chemo treaments     omeprazole 20 MG capsule  Commonly known as:  PRILOSEC  Take 20 mg by mouth daily before breakfast. Take 20 mg by mouth nightly.     ondansetron 8 MG tablet  Commonly known as:  ZOFRAN  Take 8 mg by mouth every 12 (twelve) hours as needed for nausea.     oxybutynin 10 MG 24 hr tablet  Commonly known as:  DITROPAN-XL  Take 10 mg by mouth daily.     prochlorperazine 10 MG tablet  Commonly known as:  COMPAZINE  Take 10 mg by mouth every 6 (six) hours as needed (for nasue and vomiting).     ranitidine 150 MG tablet  Commonly known as:  ZANTAC  Take 150 mg by mouth daily with supper.     traZODone 150 MG tablet  Commonly known as:  DESYREL  Take 150 mg by mouth at bedtime.        Please HPI for pertinent positives, otherwise complete 10 system ROS negative.  Physical Exam: BP 118/73  Pulse 88  Temp(Src) 97.5 F (36.4 C) (Oral)  Resp 16  SpO2 97% There is  no weight on file to calculate BMI.   General Appearance:  Alert, cooperative, no distress, appears stated age  Head:  Normocephalic, without obvious abnormality, atraumatic  Neck: Supple, symmetrical, trachea midline  Lungs:   Clear to auscultation bilaterally, no w/r/r, respirations unlabored without use of accessory muscles.  Chest Wall:  Surgical scar present right breast.   Heart:  Regular rate and rhythm, S1, S2 normal, no murmur, rub or gallop.  Abdomen:   Soft, non-tender, non distended.  Extremities: Extremities normal, atraumatic, no cyanosis or edema  Pulses: 2+ and symmetric  Neurologic: Normal affect, no gross deficits.   No results found for this or any previous visit (from the past 48 hour(s)). No results found.  Assessment/Plan Right breast cancer s/p lumpectomy, patient to receive chemotherapy in 2 days.  Port-a-cath placement requested by Dr. Darnelle Catalan. Labs reviewed. Risks and Benefits discussed with the patient. All of the patient's questions were answered, patient is agreeable to proceed. Consent signed and in chart.   Pattricia Boss D PA-C 01/12/2013, 1:05 PM

## 2013-01-13 ENCOUNTER — Other Ambulatory Visit (HOSPITAL_BASED_OUTPATIENT_CLINIC_OR_DEPARTMENT_OTHER): Payer: 59 | Admitting: Lab

## 2013-01-13 DIAGNOSIS — C50311 Malignant neoplasm of lower-inner quadrant of right female breast: Secondary | ICD-10-CM

## 2013-01-13 DIAGNOSIS — C50319 Malignant neoplasm of lower-inner quadrant of unspecified female breast: Secondary | ICD-10-CM

## 2013-01-13 LAB — COMPREHENSIVE METABOLIC PANEL (CC13)
ALT: 11 U/L (ref 0–55)
AST: 11 U/L (ref 5–34)
Albumin: 3.5 g/dL (ref 3.5–5.0)
Alkaline Phosphatase: 66 U/L (ref 40–150)
Calcium: 9 mg/dL (ref 8.4–10.4)
Chloride: 106 mEq/L (ref 98–109)
Creatinine: 0.8 mg/dL (ref 0.6–1.1)
Potassium: 4.3 mEq/L (ref 3.5–5.1)

## 2013-01-13 LAB — CBC WITH DIFFERENTIAL/PLATELET
BASO%: 0.9 % (ref 0.0–2.0)
EOS%: 0.3 % (ref 0.0–7.0)
MCH: 28.6 pg (ref 25.1–34.0)
MCHC: 33.4 g/dL (ref 31.5–36.0)
MONO%: 7.4 % (ref 0.0–14.0)
RDW: 13.3 % (ref 11.2–14.5)
lymph#: 2.2 10*3/uL (ref 0.9–3.3)

## 2013-01-14 ENCOUNTER — Telehealth: Payer: Self-pay | Admitting: Nutrition

## 2013-01-14 NOTE — Telephone Encounter (Signed)
I called patient and Dr. Darrall Dears request.  Patient did not answer phone.  However, I did leave my contact information for her to call me back or schedule an appointment with me if desiredl

## 2013-01-18 ENCOUNTER — Encounter: Payer: Self-pay | Admitting: *Deleted

## 2013-01-18 ENCOUNTER — Other Ambulatory Visit: Payer: 59

## 2013-01-19 ENCOUNTER — Other Ambulatory Visit: Payer: Self-pay | Admitting: Physician Assistant

## 2013-01-20 ENCOUNTER — Other Ambulatory Visit: Payer: 59 | Admitting: Lab

## 2013-01-20 ENCOUNTER — Ambulatory Visit (HOSPITAL_BASED_OUTPATIENT_CLINIC_OR_DEPARTMENT_OTHER): Payer: 59

## 2013-01-20 ENCOUNTER — Encounter: Payer: Self-pay | Admitting: Oncology

## 2013-01-20 ENCOUNTER — Other Ambulatory Visit (HOSPITAL_BASED_OUTPATIENT_CLINIC_OR_DEPARTMENT_OTHER): Payer: 59 | Admitting: Lab

## 2013-01-20 ENCOUNTER — Ambulatory Visit (HOSPITAL_BASED_OUTPATIENT_CLINIC_OR_DEPARTMENT_OTHER): Payer: 59 | Admitting: Physician Assistant

## 2013-01-20 ENCOUNTER — Encounter: Payer: Self-pay | Admitting: Physician Assistant

## 2013-01-20 VITALS — BP 97/60 | HR 74 | Temp 98.0°F | Resp 20 | Ht 63.0 in | Wt 122.4 lb

## 2013-01-20 DIAGNOSIS — C50319 Malignant neoplasm of lower-inner quadrant of unspecified female breast: Secondary | ICD-10-CM

## 2013-01-20 DIAGNOSIS — Z170 Estrogen receptor positive status [ER+]: Secondary | ICD-10-CM

## 2013-01-20 DIAGNOSIS — C50311 Malignant neoplasm of lower-inner quadrant of right female breast: Secondary | ICD-10-CM

## 2013-01-20 DIAGNOSIS — Z5111 Encounter for antineoplastic chemotherapy: Secondary | ICD-10-CM

## 2013-01-20 DIAGNOSIS — K5900 Constipation, unspecified: Secondary | ICD-10-CM

## 2013-01-20 DIAGNOSIS — C50911 Malignant neoplasm of unspecified site of right female breast: Secondary | ICD-10-CM

## 2013-01-20 LAB — COMPREHENSIVE METABOLIC PANEL (CC13)
AST: 11 U/L (ref 5–34)
Alkaline Phosphatase: 56 U/L (ref 40–150)
BUN: 16.1 mg/dL (ref 7.0–26.0)
Creatinine: 0.8 mg/dL (ref 0.6–1.1)
Glucose: 148 mg/dl — ABNORMAL HIGH (ref 70–140)
Potassium: 3.6 mEq/L (ref 3.5–5.1)
Total Bilirubin: 0.45 mg/dL (ref 0.20–1.20)

## 2013-01-20 LAB — CBC WITH DIFFERENTIAL/PLATELET
BASO%: 0.1 % (ref 0.0–2.0)
Basophils Absolute: 0 10*3/uL (ref 0.0–0.1)
EOS%: 0 % (ref 0.0–7.0)
HCT: 34.4 % — ABNORMAL LOW (ref 34.8–46.6)
MCH: 28.4 pg (ref 25.1–34.0)
MCHC: 33.7 g/dL (ref 31.5–36.0)
MCV: 84.3 fL (ref 79.5–101.0)
MONO%: 3.9 % (ref 0.0–14.0)
NEUT%: 90 % — ABNORMAL HIGH (ref 38.4–76.8)
lymph#: 0.9 10*3/uL (ref 0.9–3.3)

## 2013-01-20 MED ORDER — DEXAMETHASONE SODIUM PHOSPHATE 20 MG/5ML IJ SOLN
20.0000 mg | Freq: Once | INTRAMUSCULAR | Status: AC
  Administered 2013-01-20: 20 mg via INTRAVENOUS

## 2013-01-20 MED ORDER — SODIUM CHLORIDE 0.9 % IV SOLN
600.0000 mg/m2 | Freq: Once | INTRAVENOUS | Status: AC
  Administered 2013-01-20: 940 mg via INTRAVENOUS
  Filled 2013-01-20: qty 47

## 2013-01-20 MED ORDER — SODIUM CHLORIDE 0.9 % IV SOLN
Freq: Once | INTRAVENOUS | Status: AC
  Administered 2013-01-20: 11:00:00 via INTRAVENOUS

## 2013-01-20 MED ORDER — ONDANSETRON 16 MG/50ML IVPB (CHCC)
16.0000 mg | Freq: Once | INTRAVENOUS | Status: AC
  Administered 2013-01-20: 16 mg via INTRAVENOUS

## 2013-01-20 MED ORDER — SODIUM CHLORIDE 0.9 % IJ SOLN
10.0000 mL | INTRAMUSCULAR | Status: DC | PRN
  Administered 2013-01-20: 10 mL
  Filled 2013-01-20: qty 10

## 2013-01-20 MED ORDER — HEPARIN SOD (PORK) LOCK FLUSH 100 UNIT/ML IV SOLN
500.0000 [IU] | Freq: Once | INTRAVENOUS | Status: AC | PRN
  Administered 2013-01-20: 500 [IU]
  Filled 2013-01-20: qty 5

## 2013-01-20 MED ORDER — DEXTROSE 5 % IV SOLN
75.0000 mg/m2 | Freq: Once | INTRAVENOUS | Status: AC
  Administered 2013-01-20: 120 mg via INTRAVENOUS
  Filled 2013-01-20: qty 12

## 2013-01-20 NOTE — Progress Notes (Signed)
Per Lenon Curt, PharmD, okay to run taxotere cytoxan at a regular 1 hour rate, 1st dose was given at [facility] with no complications.  Pt did have sinus sensitivity with the cytoxan, will run cytoxan over 45 minutes.  SLJ

## 2013-01-20 NOTE — Progress Notes (Signed)
Enrolled pt in the Neulasta First Step program.  I faxed signed form and activated card today.  °

## 2013-01-20 NOTE — Progress Notes (Signed)
ID: [name] OB: [birth year]  MR#: [id number]  OZH#:[id number]  PCP: Elizabeth Found, MD GYN:   [name]]; [name] OTHER MD: [name], [name], [name]   HISTORY OF PRESENT ILLNESS: [name] had a routine colonoscopy under Dr. Dulce Gonzalez approximately a year and a half ago, showing a right colon lesion which could not be removed intraoperatively. CT scans of the abdomen and pelvis 07/07/2011 were negative except for the question of a soft tissue lesion in the right colon, and particularly there was no adenopathy or evidence of metastatic disease. She underwent partial colectomy under Dr. Lodema Gonzalez 07/28/2011, and the pathology from that procedure ([id number]) showed a 3.2 cm tubular adenoma without high-grade dysplasia or malignancy. Margins were negative. 0 of 16 lymph nodes were involved.  And the patient has been working at weight loss and managed to lose approximately 25 pounds over the past year. She feels this is what allowed her to palpate a mass in her right breast earlier this month. (She had had negative mammographic screening 02/26/2012). On 10/27/2012 the patient underwent bilateral diagnostic mammography. The breasts are "extremely dense". Mammography did not show any new or worrisome finding. Right breast ultrasound however located an irregular hypoechoic mass in the area of the patient's palpable abnormality. It measured approximately 7 mm. Biopsy of this mass 10/28/2012 showed an invasive ductal carcinoma, grade 2, estrogen and progesterone receptor positive, with no HER-2 amplification, and an MIB-1 of 5%.  Bilateral breast MRI 11/01/2012 Gonzalez a 1.3 cm irregular enhancing mass in the lower inner quadrant of the right breast, with no other masses of concern in either breast and no abnormal appearing lymph nodes.  The patient's subsequent history is as detailed below.   INTERVAL HISTORY: [name] returns today for  followup of her right breast cancer accompanied by her husband [name]. She is due for day 1 cycle 2 of 4 planned q. three-week cycles of docetaxel/cyclophosphamide given in the adjuvant setting, having received her first cycle at [facility] on 12/30/2012. Since her appointment here on July 25, she has attended chemotherapy school, and is also had her port placed through interventional radiology on July 30 with no complications. She is ready to proceed with treatment today as planned.   [name] is feeling well today, and has no new complaints. We reviewed her medication list, and she has all of her antinausea medications on hand at home.    REVIEW OF SYSTEMS: [name] has had no fevers or chills. She denies any rashes or skin changes. She's had no abnormal bruising or bleeding. Her appetite has been reduced. She is scheduled to meet with our nutritionist to review her diet. She's had no problems with nausea or emesis. She has mild constipation for which she utilizes [name] appropriately. She denies any cough, shortness of breath, chest pain, or palpitations. She's had no abnormal headaches or dizziness, and denies any current myalgias, arthralgias, or bony pain. She does feel like she has become more "clumsy" since initiating chemotherapy, and we discussed again the risk of peripheral neuropathy. She'll pay close attention to any signs of neuropathy in either the upper or lower extremities over the next week.  A detailed review of systems is otherwise stable and noncontributory.     PAST MEDICAL HISTORY: Past Medical History  Diagnosis Date  . Asthma     as a child, early adult...none now  . Recurrent upper respiratory infection (URI)     started 1/28.....getting better  .  GERD (gastroesophageal reflux disease)     occas  at  night....twice a  month--otc meds  . Depression   . Anxiety   . Colonic mass     ascending  . Breast cancer 10/28/12    right breast    PAST SURGICAL HISTORY: Past Surgical  History  Procedure Laterality Date  . Tubal ligation    . Hemicolectomy  07/28/11  . Breast biopsy Right 10/28/12    Invasive mammary ca, ER/PR=+, Her2 Neu-  . Appendectomy      FAMILY HISTORY Family History  Problem Relation Age of Onset  . Heart disease Father   . Cancer Paternal Aunt     unknown form of cancer  . Lung cancer Maternal Grandmother   . Testicular cancer Cousin     paternal cousin; died in 62s  . Pancreatic cancer Cousin 40  . Colon cancer Other     paternal grandmother's sister  . Cancer Other     MGM's sister   the patient's father died from congestive far to failure at the age of 66. The patient's mother is still living, in her early 28s. The patient has one brother, 4 sisters. There is no history of breast or ovarian cancer in the family to the patient's knowledge.  GYNECOLOGIC HISTORY:  Menarche age 19, first live birth age 33. The patient's less. It was September 2013 and the one before that was March 2013. She is not taking hormone replacement. She did take oral contraceptives for several years intermittently in the past. There were no complications.   SOCIAL HISTORY:  [name] is originally from [name]. 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 She is not a counselor herself, but her husband, [name] (goes by "[name]") works as a Veterinary surgeon for the [employer] as well as having his own clinic in [name]. He is originally from [name]. The patient's children are [name], who lives in [name] and has a Scientist, water quality in counseling; and [name], who is a Consulting civil engineer at [name]. He lives with the patient, and shares custody of his daughter, who is 55 years old and spends every other weekend at the patient's home.    ADVANCED DIRECTIVES: In place   HEALTH MAINTENANCE: History  Substance Use Topics  . Smoking status: Never Smoker   . Smokeless tobacco: Never Used  . Alcohol Use: Yes      Comment: wine occasionally     Colonoscopy: 06/02/2011 / [facility]law  PAP:  Bone density:  Lipid panel:  No Known Allergies  Current Outpatient Prescriptions  Medication Sig Dispense Refill  . amphetamine-dextroamphetamine (ADDERALL XR) 15 MG 24 hr capsule Take 15 mg by mouth every morning.      Marland Kitchen buPROPion (WELLBUTRIN XL) 150 MG 24 hr tablet Take 150 mg by mouth every morning.      . clindamycin-benzoyl peroxide (BENZACLIN) gel Apply 1 application topically at bedtime as needed (for face).       . clonazePAM (KLONOPIN) 0.5 MG tablet Take 0.5 mg by mouth 3 (three) times daily as needed for anxiety.       Marland Kitchen dexamethasone (DECADRON) 4 MG tablet Take 8 mg (2 x 4mg  tablets) by mouth at 8am & 5pm 1 day BEFORE chemo, 8am & 5pm on Day 2, 8am on Day 3, then as directed.      Marland Kitchen ibuprofen (ADVIL,MOTRIN) 200 MG tablet Take 200 mg by mouth every 6 (six) hours as needed for pain.      Marland Kitchen  lidocaine-prilocaine (EMLA) cream Apply topically as needed. Apply over port site 2 hours before chemo; cover with saran wrap or similar  30 g  0  . loratadine (CLARITIN) 10 MG tablet Take one tablet by mouth the morning of neulasta (day 2 chemo) and the next 3 days  30 tablet  2  . omeprazole (PRILOSEC) 20 MG capsule Take 20 mg by mouth daily before breakfast. Take 20 mg by mouth nightly.      . ondansetron (ZOFRAN) 8 MG tablet Take 8 mg by mouth every 12 (twelve) hours as needed for nausea.       Marland Kitchen oxybutynin (DITROPAN-XL) 10 MG 24 hr tablet Take 10 mg by mouth daily.       . pegfilgrastim (NEULASTA) 6 MG/0.6ML injection Inject 6 mg into the skin once. Day after chemo treaments      . polyethylene glycol ([name] / GLYCOLAX) packet Take 17 g by mouth daily as needed.      . prochlorperazine (COMPAZINE) 10 MG tablet Take 10 mg by mouth every 6 (six) hours as needed (for nasue and vomiting).       . ranitidine (ZANTAC) 150 MG tablet Take 150 mg by mouth daily with supper.      . traZODone (DESYREL) 150 MG tablet Take 150 mg  by mouth at bedtime.        No current facility-administered medications for this visit.    OBJECTIVE: Middle-aged Latin American woman in no acute distress Filed Vitals:   01/20/13 0955  BP: 97/60  Pulse: 74  Temp: 98 F (36.7 C)  Resp: 20     Body mass index is 21.69 kg/(m^2).    ECOG FS: 1 Filed Weights   01/20/13 0955  Weight: 122 lb 6.4 oz (55.52 kg)    Sclerae unicteric Oropharynx clear, no ulcerations or evidence of thrush No cervical or supraclavicular adenopathy Lungs clear to auscultation bilaterally, no rales or rhonchi Heart regular rate and rhythm Abd soft, thin, nontender, positive bowel sounds, no masses palpated MSK no focal spinal tenderness, no peripheral edema Neuro: nonfocal, well oriented, appropriate affect Breasts: Deferred. Axillae are benign bilaterally with no palpable adenopathy Port is intact in the left upper chest wall, mild ecchymoses noted, but no erythema or edema. Incisions appear to be healing well.    LAB RESULTS:   Lab Results  Component Value Date   WBC 14.2* 01/20/2013   NEUTROABS 12.8* 01/20/2013   HGB 11.6 01/20/2013   HCT 34.4* 01/20/2013   MCV 84.3 01/20/2013   PLT 176 01/20/2013      Chemistry      Component Value Date/Time   NA 139 01/13/2013 0904   NA 139 07/30/2011 0615   K 4.3 01/13/2013 0904   K 4.1 07/30/2011 0615   CL 102 11/03/2012 0821   CL 104 07/30/2011 0615   CO2 26 01/13/2013 0904   CO2 25 07/30/2011 0615   BUN 15.7 01/13/2013 0904   BUN 5* 07/30/2011 0615   CREATININE 0.8 01/13/2013 0904   CREATININE 0.68 07/30/2011 0615      Component Value Date/Time   CALCIUM 9.0 01/13/2013 0904   CALCIUM 8.8 07/30/2011 0615   ALKPHOS 66 01/13/2013 0904   ALKPHOS 56 07/22/2011 0845   AST 11 01/13/2013 0904   AST 14 07/22/2011 0845   ALT 11 01/13/2013 0904   ALT 14 07/22/2011 0845   BILITOT 0.42 01/13/2013 0904   BILITOT 0.3 07/22/2011 0845       STUDIES:  Ir  Fluoro Guide Cv Line Left  01/12/2013   *RADIOLOGY REPORT*  Indication:  History of right-sided breast cancer, in the aid of intravenous access for chemotherapy administration  IMPLANTED PORT A CATH PLACEMENT WITH ULTRASOUND AND FLUOROSCOPIC GUIDANCE  Comparison: None  Sedation: Versed 2 mg IV; Fentanyl 100 mcg IV; Ancef 2 gm IV; IV antibiotic was given in an appropriate time interval prior to skin puncture.  Total Moderate Sedation Time: 30 minutes.  Contrast: None  Fluoroscopy Time: 30-seconds  Complications: None immediate  Procedure:  The procedure, risks, benefits, and alternatives were explained to the patient.  Questions regarding the procedure were encouraged and answered.  The patient understands and consents to the procedure.  Given the presence of right-sided breast cancer, the decision was made to place a left anterior chest wall internal jugular approach port catheter.  As such, the left neck and chest were prepped with chlorhexidine in a sterile fashion, and a sterile drape was applied covering the operative field.  Maximum barrier sterile technique with sterile gowns and gloves were used for the procedure.  A timeout was performed prior to the initiation of the procedure. Local anesthesia was provided with 1% lidocaine with epinephrine.  After creating a small venotomy incision, a micropuncture kit was utilized to access the right internal jugular vein under direct, real-time ultrasound guidance.  Ultrasound image documentation was performed.  The microwire was kinked to measure appropriate catheter length.  A subcutaneous port pocket was then created along the upper chest wall utilizing a combination of sharp and blunt dissection.  The pocket was irrigated with sterile saline.  A single lumen ISP power injectable port was chosen for placement.  The 8 Fr catheter was tunneled from the port pocket site to the venotomy incision.  The port was placed in the pocket.  The external catheter was trimmed to appropriate length.  At the venotomy, an 8 Fr peel-away sheath was placed  over a guidewire under fluoroscopic guidance.  The catheter was then placed through the sheath and the sheath was removed.  Final catheter positioning was confirmed and documented with a fluoroscopic spot radiograph.  The port was accessed with a Huber needle, aspirated and flushed with heparinized saline.  The venotomy site was closed with an interrupted 4-0 Vicryl suture. The port pocket incision was closed with interrupted 2-0 Vicryl suture and the skin was opposed with a running subcuticular 4-0 Vicryl suture.  Dermabond and Steri-strips were applied to both incisions.  Dressings were placed.  The patient tolerated the procedure well without immediate post procedural complication.  Findings:  After catheter placement, the tip lies at the superior cavoatrial junction.  The catheter aspirates and flushes normally and is ready for immediate use.  IMPRESSION:  Successful placement of a left internal jugular approach single lumen power injectable Port-A-Cath.  The catheter is ready for immediate use.   Original Report Authenticated By: Tacey Ruiz, MD   Ir US Guide Vasc Access Left  01/12/2013   *RADIOLOGY REPORT*  Indication: History of right-sided breast cancer, in the aid of intravenous access for chemotherapy administration  IMPLANTED PORT A CATH PLACEMENT WITH ULTRASOUND AND FLUOROSCOPIC GUIDANCE  Comparison: None  Sedation: Versed 2 mg IV; Fentanyl 100 mcg IV; Ancef 2 gm IV; IV antibiotic was given in an appropriate time interval prior to skin puncture.  Total Moderate Sedation Time: 30 minutes.  Contrast: None  Fluoroscopy Time: 30-seconds  Complications: None immediate  Procedure:  The procedure, risks, benefits, and alternatives were explained  to the patient.  Questions regarding the procedure were encouraged and answered.  The patient understands and consents to the procedure.  Given the presence of right-sided breast cancer, the decision was made to place a left anterior chest wall internal jugular  approach port catheter.  As such, the left neck and chest were prepped with chlorhexidine in a sterile fashion, and a sterile drape was applied covering the operative field.  Maximum barrier sterile technique with sterile gowns and gloves were used for the procedure.  A timeout was performed prior to the initiation of the procedure. Local anesthesia was provided with 1% lidocaine with epinephrine.  After creating a small venotomy incision, a micropuncture kit was utilized to access the right internal jugular vein under direct, real-time ultrasound guidance.  Ultrasound image documentation was performed.  The microwire was kinked to measure appropriate catheter length.  A subcutaneous port pocket was then created along the upper chest wall utilizing a combination of sharp and blunt dissection.  The pocket was irrigated with sterile saline.  A single lumen ISP power injectable port was chosen for placement.  The 8 Fr catheter was tunneled from the port pocket site to the venotomy incision.  The port was placed in the pocket.  The external catheter was trimmed to appropriate length.  At the venotomy, an 8 Fr peel-away sheath was placed over a guidewire under fluoroscopic guidance.  The catheter was then placed through the sheath and the sheath was removed.  Final catheter positioning was confirmed and documented with a fluoroscopic spot radiograph.  The port was accessed with a Huber needle, aspirated and flushed with heparinized saline.  The venotomy site was closed with an interrupted 4-0 Vicryl suture. The port pocket incision was closed with interrupted 2-0 Vicryl suture and the skin was opposed with a running subcuticular 4-0 Vicryl suture.  Dermabond and Steri-strips were applied to both incisions.  Dressings were placed.  The patient tolerated the procedure well without immediate post procedural complication.  Findings:  After catheter placement, the tip lies at the superior cavoatrial junction.  The catheter  aspirates and flushes normally and is ready for immediate use.  IMPRESSION:  Successful placement of a left internal jugular approach single lumen power injectable Port-A-Cath.  The catheter is ready for immediate use.   Original Report Authenticated By: Tacey Ruiz, MD     ASSESSMENT: 52 y.o. 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  First cycle given at [facility], with subsequent cycles planned at [facility].   PLAN:  Over half of our 40 minute appointment today was spent in addressing Kambri's questions, reviewing her treatment plan, and coordinating care.  Siomara will proceed to  treatment today as scheduled for her second cycle of adjuvant docetaxel/cyclophosphamide, and will return tomorrow for Neulasta on day 2. We again reviewed her antinausea regimen and she voiced her understanding of how to utilize all of those drugs appropriately. I encouraged her to keep herself well hydrated. She will also pay close attention to any signs of increased peripheral neuropathy over the next week, and we will reassess when I see  her on August 14.  Ricki Rodriguez and Jake Shark both voice understanding and agreement with our plan thus far.   She knows to call for any problems that may develop before her next visit here.  Aydrian Halpin, PA-C   01/20/2013 10:31 AM

## 2013-01-20 NOTE — Patient Instructions (Signed)
[facility] Discharge Instructions for Patients Receiving Chemotherapy  Today you received the following chemotherapy agents taxotere, cytoxan  To help prevent nausea and vomiting after your treatment, we encourage you to take your nausea medication as needed   If you develop nausea and vomiting that is not controlled by your nausea medication, call the clinic.   BELOW ARE SYMPTOMS THAT SHOULD BE REPORTED IMMEDIATELY:  *FEVER GREATER THAN 100.5 F  *CHILLS WITH OR WITHOUT FEVER  NAUSEA AND VOMITING THAT IS NOT CONTROLLED WITH YOUR NAUSEA MEDICATION  *UNUSUAL SHORTNESS OF BREATH  *UNUSUAL BRUISING OR BLEEDING  TENDERNESS IN MOUTH AND THROAT WITH OR WITHOUT PRESENCE OF ULCERS  *URINARY PROBLEMS  *BOWEL PROBLEMS  UNUSUAL RASH Items with * indicate a potential emergency and should be followed up as soon as possible.  Feel free to call the clinic you have any questions or concerns. The clinic phone number is [phone].    

## 2013-01-21 ENCOUNTER — Ambulatory Visit: Payer: 59 | Admitting: Nutrition

## 2013-01-21 ENCOUNTER — Ambulatory Visit (HOSPITAL_BASED_OUTPATIENT_CLINIC_OR_DEPARTMENT_OTHER): Payer: 59

## 2013-01-21 ENCOUNTER — Telehealth: Payer: Self-pay | Admitting: *Deleted

## 2013-01-21 VITALS — BP 107/43 | HR 92 | Temp 98.1°F

## 2013-01-21 DIAGNOSIS — C50911 Malignant neoplasm of unspecified site of right female breast: Secondary | ICD-10-CM

## 2013-01-21 DIAGNOSIS — Z5189 Encounter for other specified aftercare: Secondary | ICD-10-CM

## 2013-01-21 DIAGNOSIS — C50311 Malignant neoplasm of lower-inner quadrant of right female breast: Secondary | ICD-10-CM

## 2013-01-21 DIAGNOSIS — C50319 Malignant neoplasm of lower-inner quadrant of unspecified female breast: Secondary | ICD-10-CM

## 2013-01-21 MED ORDER — PEGFILGRASTIM INJECTION 6 MG/0.6ML
6.0000 mg | Freq: Once | SUBCUTANEOUS | Status: AC
  Administered 2013-01-21: 6 mg via SUBCUTANEOUS
  Filled 2013-01-21: qty 0.6

## 2013-01-21 NOTE — Telephone Encounter (Signed)
Message copied by Augusto Garbe on Fri Jan 21, 2013 12:30 PM ------      Message from: Caren Griffins      Created: Thu Jan 20, 2013 11:17 AM      Regarding: chemo f/u       1st taxotere, cytoxan at [facility].  This is her 2nd cycle.  She had her 1st dose at [facility].  Dr Darnelle Catalan            [phone] (M)       ------

## 2013-01-21 NOTE — Progress Notes (Signed)
Patient is a 52 year old female diagnosed with ER, PR positive breast cancer in May 2014.  She is a patient of Dr. Darnelle Catalan.  Past medical history includes asthma, GERD, depression, anxiety, and colonic mass.  Medications include Adderall, Wellbutrin, Klonopin, Decadron, Prilosec, Zofran, MiraLax, Compazine, and Zantac.  Labs include glucose 148 on August 7, glucose 86 on July 31.  Height: 63 inches. Weight: 122.4 pounds. Usual body weight 140 pounds January 2013. BMI: 21.69.  Patient and husband present to nutrition consult.  Both verbalized concern with extremely restricted diet recommended by a registered dietitian at [facility].  They verbalize anxiety with making extreme changes as was recommended.  They would like further information on diet for breast cancer, during treatment and after treatment.  Patient does report poor appetite, and constipation.  She is working to increase hydration.  She recently lost approximately 20 pounds intentionally prior to diagnosis. Her weight is stable at this time.  Patient has been requesting information on places he could purchase prepared meals for his family.  Nutrition diagnosis: Food and nutrition related knowledge deficit related to diagnosis of breast cancer and associated treatments as evidenced by no prior need for nutrition related information.  Intervention: Patient was educated to move towards a plant-based diet focused on more fruits, vegetables, and plant-based proteins.  I have reviewed facts regarding organic food.  We have discussed soy intake and breast cancer patients.  I have educated her on other sources of protein in her diet.  Patient was encouraged to consume small amounts of food frequently throughout the day to promote weight maintenance.  Nutrition fact sheets were provided along with contact information for further questions or concerns.  Patient was also educated on websites that she could review for educational information.  I provided  several options for husband to purchase healthy foods/meals for his family.  Teach back method used.  Monitoring, evaluation, goals: Patient will consume a healthy, plant-based diet with adequate protein, and calories to promote weight maintenance and tolerate recommended treatment for breast cancer.  Next visit: Patient will contact me if she has further questions or concerns.

## 2013-01-21 NOTE — Telephone Encounter (Signed)
Called [name] for chemotherapy F/U.  Patient is doing well.  Denies n/v.  Denies any new side effects or symptoms.  Bowel and bladder is functioning well.  Eating small amounts reporting appetite is not that good.  Trying to surround herself with good wholesome foods.  She is drinking well and I instructed to drink 64 oz minimum daily or at least the day before, of and after treatment.  Denies questions at this time and encouraged to call if needed.  Reviewed how to call after hours in the case of an emergency.

## 2013-01-24 ENCOUNTER — Encounter: Payer: Self-pay | Admitting: Physician Assistant

## 2013-01-25 ENCOUNTER — Telehealth: Payer: Self-pay | Admitting: *Deleted

## 2013-01-25 NOTE — Telephone Encounter (Signed)
Spoke with Mason who says she is "feeling much better.  Took one senna last night and her bowels moved enabling her to empty everything."  Was having cramps, nausea, passing gas but now is relieved and would like to know if she should take both or which laxative.  Asked that she take both and determine what works best for her body.  Gave suggestion to avoid diarrhea to drink half of the mira lax every day instead of the full 8 oz. Continue one senna at bedtime.  Next f/u is 01-27-2013 at 1:15 with Ms. Allyson Sabal and can update [place] at that time on bowel status.

## 2013-01-27 ENCOUNTER — Encounter: Payer: Self-pay | Admitting: Physician Assistant

## 2013-01-27 ENCOUNTER — Telehealth: Payer: Self-pay | Admitting: Oncology

## 2013-01-27 ENCOUNTER — Other Ambulatory Visit (HOSPITAL_BASED_OUTPATIENT_CLINIC_OR_DEPARTMENT_OTHER): Payer: 59 | Admitting: Lab

## 2013-01-27 ENCOUNTER — Ambulatory Visit (HOSPITAL_BASED_OUTPATIENT_CLINIC_OR_DEPARTMENT_OTHER): Payer: 59 | Admitting: Physician Assistant

## 2013-01-27 VITALS — BP 101/64 | HR 95 | Temp 98.3°F | Resp 20 | Ht 63.0 in | Wt 121.4 lb

## 2013-01-27 DIAGNOSIS — C50311 Malignant neoplasm of lower-inner quadrant of right female breast: Secondary | ICD-10-CM

## 2013-01-27 DIAGNOSIS — C50319 Malignant neoplasm of lower-inner quadrant of unspecified female breast: Secondary | ICD-10-CM

## 2013-01-27 DIAGNOSIS — Z170 Estrogen receptor positive status [ER+]: Secondary | ICD-10-CM

## 2013-01-27 DIAGNOSIS — D649 Anemia, unspecified: Secondary | ICD-10-CM | POA: Insufficient documentation

## 2013-01-27 DIAGNOSIS — K219 Gastro-esophageal reflux disease without esophagitis: Secondary | ICD-10-CM

## 2013-01-27 LAB — CBC WITH DIFFERENTIAL/PLATELET
Basophils Absolute: 0.1 10*3/uL (ref 0.0–0.1)
EOS%: 2.4 % (ref 0.0–7.0)
Eosinophils Absolute: 0.1 10*3/uL (ref 0.0–0.5)
HCT: 33.1 % — ABNORMAL LOW (ref 34.8–46.6)
HGB: 11.1 g/dL — ABNORMAL LOW (ref 11.6–15.9)
MCH: 28.2 pg (ref 25.1–34.0)
MCV: 84 fL (ref 79.5–101.0)
MONO%: 18.7 % — ABNORMAL HIGH (ref 0.0–14.0)
NEUT#: 1 10*3/uL — ABNORMAL LOW (ref 1.5–6.5)
NEUT%: 33.9 % — ABNORMAL LOW (ref 38.4–76.8)
Platelets: 174 10*3/uL (ref 145–400)
RDW: 13.3 % (ref 11.2–14.5)

## 2013-01-27 MED ORDER — PANTOPRAZOLE SODIUM 40 MG PO TBEC: 40.0000 mg | DELAYED_RELEASE_TABLET | Freq: Every day | ORAL | Status: DC

## 2013-01-27 NOTE — Progress Notes (Signed)
ID: [name] OB: [birth year]  MR#: [id number]  JYN#:[id number]  PCP: Allean Found, MD GYN:   SUMikey Bussing Ingram]; Billy Fischer OTHER MD: Rogelia Mire, Lurline Hare, Garald Braver   HISTORY OF PRESENT ILLNESS: Ajee had a routine colonoscopy under Dr. Dulce Sellar approximately a year and a half ago, showing a right colon lesion which could not be removed intraoperatively. CT scans of the abdomen and pelvis 07/07/2011 were negative except for the question of a soft tissue lesion in the right colon, and particularly there was no adenopathy or evidence of metastatic disease. She underwent partial colectomy under Dr. Lodema Pilot 07/28/2011, and the pathology from that procedure ([id number]) showed a 3.2 cm tubular adenoma without high-grade dysplasia or malignancy. Margins were negative. 0 of 16 lymph nodes were involved.  And the patient has been working at weight loss and managed to lose approximately 25 pounds over the past year. She feels this is what allowed her to palpate a mass in her right breast earlier this month. (She had had negative mammographic screening 02/26/2012). On 10/27/2012 the patient underwent bilateral diagnostic mammography. The breasts are "extremely dense". Mammography did not show any new or worrisome finding. Right breast ultrasound however located an irregular hypoechoic mass in the area of the patient's palpable abnormality. It measured approximately 7 mm. Biopsy of this mass 10/28/2012 showed an invasive ductal carcinoma, grade 2, estrogen and progesterone receptor positive, with no HER-2 amplification, and an MIB-1 of 5%.  Bilateral breast MRI 11/01/2012 found a 1.3 cm irregular enhancing mass in the lower inner quadrant of the right breast, with no other masses of concern in either breast and no abnormal appearing lymph nodes.  The patient's subsequent history is as detailed below.   INTERVAL HISTORY: Keashia returns alone today  for followup of her right breast cancer. She is currently day 8 cycle 2 of 4 planned q. three-week cycles of docetaxel/cyclophosphamide given in the adjuvant setting.  She receives Neulasta on day 2 for granulocyte support.  Stormie tolerated treatment well last week. She had only a few complaints. Her biggest concern is the fact that she has had increased reflux, despite the use of both omeprazole and ranitidine. She had nausea for one day, but has had no emesis. She had some constipation which has resolved. She has some taste aversion, but is eating, and is trying to keep herself very well hydrated.  Florentina sometimes feels "unsteady" when she is walking, and says this sometimes she "doesn't walk in a straight line". She denies any abnormal headaches or dizziness. She does have some blurred vision, but no diplopia. As noted above, she's had nausea during 1 day over the past week, but has had no emesis. She's had no falls, injuries, seizure activity, or loss of consciousness. She's had only some intermittent numbness and tingling in the upper and lower extremities. (I will mention that she is walking easily in her very high-heeled shoes today.)  REVIEW OF SYSTEMS: Drianna has had no fevers or chills. She denies any rashes or skin changes. She's had no abnormal bruising or bleeding.  She denies any cough, shortness of breath, chest pain, or palpitations. She denies any current myalgias, arthralgias, or bony pain. She's had no peripheral swelling.  A detailed review of systems is otherwise stable and noncontributory.     PAST MEDICAL HISTORY: Past Medical History  Diagnosis Date  . Asthma     as a child, early adult...none now  . Recurrent upper respiratory  infection (URI)     started 1/28.....getting better  . GERD (gastroesophageal reflux disease)     occas  at  night....twice a  month--otc meds  . Depression   . Anxiety   . Colonic mass     ascending  . Breast cancer 10/28/12    right  breast    PAST SURGICAL HISTORY: Past Surgical History  Procedure Laterality Date  . Tubal ligation    . Hemicolectomy  07/28/11  . Breast biopsy Right 10/28/12    Invasive mammary ca, ER/PR=+, Her2 Neu-  . Appendectomy      FAMILY HISTORY Family History  Problem Relation Age of Onset  . Heart disease Father   . Cancer Paternal Aunt     unknown form of cancer  . Lung cancer Maternal Grandmother   . Testicular cancer Cousin     paternal cousin; died in 2s  . Pancreatic cancer Cousin 40  . Colon cancer Other     paternal grandmother's sister  . Cancer Other     MGM's sister   the patient's father died from congestive far to failure at the age of 97. The patient's mother is still living, in her early 86s. The patient has one brother, 4 sisters. There is no history of breast or ovarian cancer in the family to the patient's knowledge.  GYNECOLOGIC HISTORY:  Menarche age 62, first live birth age 68. The patient's less. It was September 2013 and the one before that was March 2013. She is not taking hormone replacement. She did take oral contraceptives for several years intermittently in the past. There were no complications.   SOCIAL HISTORY:  Waver is originally from [place]. 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 She is not a counselor herself, but her husband, Ailyn Gladd (goes by "[name]") works as a Veterinary surgeon for the [employer] as well as having his own clinic in [place]. He is originally from [place]. The patient's children are [name], who lives in [place] and has a Scientist, water quality in counseling; and [name], who is a Consulting civil engineer at [employer]. He lives with the patient, and shares custody of his daughter, who is 31 years old and spends every other weekend at the patient's home.    ADVANCED DIRECTIVES: In place   HEALTH MAINTENANCE: History  Substance Use Topics  . Smoking status: Never Smoker   .  Smokeless tobacco: Never Used  . Alcohol Use: Yes     Comment: wine occasionally     Colonoscopy: 06/02/2011 / [facility]  PAP:  Bone density:  Lipid panel:  No Known Allergies  Current Outpatient Prescriptions  Medication Sig Dispense Refill  . amphetamine-dextroamphetamine (ADDERALL XR) 15 MG 24 hr capsule Take 15 mg by mouth every morning.      Marland Kitchen buPROPion (WELLBUTRIN XL) 150 MG 24 hr tablet Take 150 mg by mouth every morning.      . clindamycin-benzoyl peroxide (BENZACLIN) gel Apply 1 application topically at bedtime as needed (for face).       . clonazePAM (KLONOPIN) 0.5 MG tablet Take 0.5 mg by mouth 3 (three) times daily as needed for anxiety.       Marland Kitchen dexamethasone (DECADRON) 4 MG tablet Take 8 mg (2 x 4mg  tablets) by mouth at 8am & 5pm 1 day BEFORE chemo, 8am & 5pm on Day 2, 8am on Day 3, then as directed.      Marland Kitchen ibuprofen (ADVIL,MOTRIN) 200 MG tablet Take 200 mg by mouth every  6 (six) hours as needed for pain.      Marland Kitchen lidocaine-prilocaine (EMLA) cream Apply topically as needed. Apply over port site 2 hours before chemo; cover with saran wrap or similar  30 g  0  . loratadine (CLARITIN) 10 MG tablet Take one tablet by mouth the morning of neulasta (day 2 chemo) and the next 3 days  30 tablet  2  . omeprazole (PRILOSEC) 20 MG capsule Take 20 mg by mouth daily before breakfast. Take 20 mg by mouth nightly.      . ondansetron (ZOFRAN) 8 MG tablet Take 8 mg by mouth every 12 (twelve) hours as needed for nausea.       Marland Kitchen oxybutynin (DITROPAN-XL) 10 MG 24 hr tablet Take 10 mg by mouth daily.       . pantoprazole (PROTONIX) 40 MG tablet Take 1 tablet (40 mg total) by mouth daily.  30 tablet  3  . pegfilgrastim (NEULASTA) 6 MG/0.6ML injection Inject 6 mg into the skin once. Day after chemo treaments      . polyethylene glycol (MIRALAX / GLYCOLAX) packet Take 17 g by mouth daily as needed.      . prochlorperazine (COMPAZINE) 10 MG tablet Take 10 mg by mouth every 6 (six) hours as needed (for  nasue and vomiting).       . ranitidine (ZANTAC) 150 MG tablet Take 150 mg by mouth daily with supper.      . traZODone (DESYREL) 150 MG tablet Take 150 mg by mouth at bedtime.        No current facility-administered medications for this visit.    OBJECTIVE: Middle-aged Latin American woman in no acute distress Filed Vitals:   01/27/13 1321  BP: 101/64  Pulse: 95  Temp: 98.3 F (36.8 C)  Resp: 20     Body mass index is 21.51 kg/(m^2).    ECOG FS: 1 Filed Weights   01/27/13 1321  Weight: 121 lb 6.4 oz (55.067 kg)   Sclerae unicteric Oropharynx clear, no ulcerations or evidence of thrush No cervical or supraclavicular adenopathy Lungs clear to auscultation bilaterally, no rales or rhonchi Heart regular rate and rhythm Abd soft, thin, nontender, positive bowel sounds, no masses palpated. MSK no focal spinal tenderness, no peripheral edema Neuro: nonfocal, well oriented, appropriate affect Breasts: Deferred. Axillae are benign bilaterally with no palpable adenopathy. Port is intact in the left upper chest wall,  no erythema or edema.    LAB RESULTS:   Lab Results  Component Value Date   WBC 2.9* 01/27/2013   NEUTROABS 1.0* 01/27/2013   HGB 11.1* 01/27/2013   HCT 33.1* 01/27/2013   MCV 84.0 01/27/2013   PLT 174 01/27/2013      Chemistry      Component Value Date/Time   NA 138 01/20/2013 0910   NA 139 07/30/2011 0615   K 3.6 01/20/2013 0910   K 4.1 07/30/2011 0615   CL 102 11/03/2012 0821   CL 104 07/30/2011 0615   CO2 24 01/20/2013 0910   CO2 25 07/30/2011 0615   BUN 16.1 01/20/2013 0910   BUN 5* 07/30/2011 0615   CREATININE 0.8 01/20/2013 0910   CREATININE 0.68 07/30/2011 0615      Component Value Date/Time   CALCIUM 9.8 01/20/2013 0910   CALCIUM 8.8 07/30/2011 0615   ALKPHOS 56 01/20/2013 0910   ALKPHOS 56 07/22/2011 0845   AST 11 01/20/2013 0910   AST 14 07/22/2011 0845   ALT 7 01/20/2013 0910   ALT  14 07/22/2011 0845   BILITOT 0.45 01/20/2013 0910   BILITOT 0.3 07/22/2011 0845        STUDIES:  No recent studies.   ASSESSMENT: 52 y.o. [place] woman status post right breast lower inner quadrant biopsy 10/28/2012 for a clinical T1c N0, stage IA invasive ductal carcinoma, grade 2, estrogen and progesterone receptor positive, HER-2 not amplified, with an MIB-1 of 20%  (1) s/p Right lumpectomy and sentinel lymph node sampling 11/17/2012 for a pT2 pN0, stage IIA invasive ductal carcinoma with prominent lobular features, grade 2, with initially positive  margins cleared intraoperatively; estrogen receptor positive with an Allred score of 7, progesterone receptor positive with an Allred score of 8, HercepTest 0, proliferation index by ACIS III 32% ([id number])   (2) Oncotype DX score of 33 predicting a risk of distant recurrence within 10 years of 23% if her only systemic treatment is tamoxifen for 5 years; the estrogen receptor was interpreted as negative on the Oncotype report  (3) adjuvant cyclophosphamide and docetaxel started 12/30/2012, 4 cycles planned, with Neulasta support on day 2.  First cycle given at [facility], with subsequent cycles planned at [facility].   PLAN:  Kaleena seems to be tolerating her chemotherapy well. We are going to increase her GERD medication, and try Protonix, 40 mg daily.  She will also use stool softeners and/or MiraLAX as needed to  manage her constipation.  I have asked her to let us know if she begins to have headaches, dizziness, additional changes in vision, or if she feels more "unsteady" when she walks.  We certainly could consider brain MRI for further evaluation, and this was discussed with the patient today. Based on her description of her symptoms, however, I feel that the unsteadiness is likely associated with fatigue, and possibly some intermittent neuropathy.  Since she is leaving to go out of town next week, she would like to come in first thing Monday morning to have her CBC rechecked which I think is certainly reasonable.  She also  has a copy of today's labs, along with her most recent office progress note take with her out of town. [employer]  voices understanding and agreement with our plan thus far.  She knows to call for any problems that may develop before her next visit here.  Kiernan Atkerson, PA-C   01/27/2013 4:05 PM

## 2013-01-31 ENCOUNTER — Other Ambulatory Visit (HOSPITAL_BASED_OUTPATIENT_CLINIC_OR_DEPARTMENT_OTHER): Payer: 59 | Admitting: Lab

## 2013-01-31 DIAGNOSIS — C50311 Malignant neoplasm of lower-inner quadrant of right female breast: Secondary | ICD-10-CM

## 2013-01-31 DIAGNOSIS — C50319 Malignant neoplasm of lower-inner quadrant of unspecified female breast: Secondary | ICD-10-CM

## 2013-01-31 LAB — CBC WITH DIFFERENTIAL/PLATELET
Basophils Absolute: 0.1 10*3/uL (ref 0.0–0.1)
EOS%: 0.7 % (ref 0.0–7.0)
MCH: 28.5 pg (ref 25.1–34.0)
RDW: 14.1 % (ref 11.2–14.5)
WBC: 15.9 10*3/uL — ABNORMAL HIGH (ref 3.9–10.3)
lymph#: 2.4 10*3/uL (ref 0.9–3.3)
nRBC: 0 % (ref 0–0)

## 2013-02-10 ENCOUNTER — Telehealth: Payer: Self-pay | Admitting: *Deleted

## 2013-02-10 ENCOUNTER — Other Ambulatory Visit (HOSPITAL_BASED_OUTPATIENT_CLINIC_OR_DEPARTMENT_OTHER): Payer: 59 | Admitting: Lab

## 2013-02-10 ENCOUNTER — Ambulatory Visit (HOSPITAL_BASED_OUTPATIENT_CLINIC_OR_DEPARTMENT_OTHER): Payer: 59 | Admitting: Oncology

## 2013-02-10 ENCOUNTER — Ambulatory Visit (HOSPITAL_BASED_OUTPATIENT_CLINIC_OR_DEPARTMENT_OTHER): Payer: 59

## 2013-02-10 ENCOUNTER — Other Ambulatory Visit: Payer: 59 | Admitting: Lab

## 2013-02-10 VITALS — BP 99/63 | HR 97 | Temp 97.5°F | Resp 20 | Ht 63.0 in | Wt 121.1 lb

## 2013-02-10 DIAGNOSIS — C50311 Malignant neoplasm of lower-inner quadrant of right female breast: Secondary | ICD-10-CM

## 2013-02-10 DIAGNOSIS — C50319 Malignant neoplasm of lower-inner quadrant of unspecified female breast: Secondary | ICD-10-CM

## 2013-02-10 DIAGNOSIS — K219 Gastro-esophageal reflux disease without esophagitis: Secondary | ICD-10-CM

## 2013-02-10 DIAGNOSIS — C50911 Malignant neoplasm of unspecified site of right female breast: Secondary | ICD-10-CM

## 2013-02-10 DIAGNOSIS — Z170 Estrogen receptor positive status [ER+]: Secondary | ICD-10-CM

## 2013-02-10 DIAGNOSIS — Z5111 Encounter for antineoplastic chemotherapy: Secondary | ICD-10-CM

## 2013-02-10 LAB — CBC WITH DIFFERENTIAL/PLATELET
Eosinophils Absolute: 0 10*3/uL (ref 0.0–0.5)
LYMPH%: 11.3 % — ABNORMAL LOW (ref 14.0–49.7)
MONO#: 0.6 10*3/uL (ref 0.1–0.9)
NEUT#: 11 10*3/uL — ABNORMAL HIGH (ref 1.5–6.5)
Platelets: 275 10*3/uL (ref 145–400)
RBC: 3.88 10*6/uL (ref 3.70–5.45)
RDW: 14.5 % (ref 11.2–14.5)
WBC: 13.2 10*3/uL — ABNORMAL HIGH (ref 3.9–10.3)
lymph#: 1.5 10*3/uL (ref 0.9–3.3)

## 2013-02-10 LAB — COMPREHENSIVE METABOLIC PANEL (CC13)
AST: 10 U/L (ref 5–34)
BUN: 17.9 mg/dL (ref 7.0–26.0)
CO2: 25 mEq/L (ref 22–29)
Calcium: 9.4 mg/dL (ref 8.4–10.4)
Chloride: 107 mEq/L (ref 98–109)
Creatinine: 0.7 mg/dL (ref 0.6–1.1)

## 2013-02-10 MED ORDER — SODIUM CHLORIDE 0.9 % IV SOLN
Freq: Once | INTRAVENOUS | Status: AC
  Administered 2013-02-10: 10:00:00 via INTRAVENOUS

## 2013-02-10 MED ORDER — SENNOSIDES-DOCUSATE SODIUM 8.6-50 MG PO TABS: 1.0000 | ORAL_TABLET | Freq: Every day | ORAL | Status: DC

## 2013-02-10 MED ORDER — ONDANSETRON 16 MG/50ML IVPB (CHCC)
16.0000 mg | Freq: Once | INTRAVENOUS | Status: AC
  Administered 2013-02-10: 16 mg via INTRAVENOUS

## 2013-02-10 MED ORDER — METOCLOPRAMIDE HCL 10 MG PO TABS: 5.0000 mg | ORAL_TABLET | Freq: Four times a day (QID) | ORAL | Status: DC

## 2013-02-10 MED ORDER — DEXAMETHASONE SODIUM PHOSPHATE 20 MG/5ML IJ SOLN
20.0000 mg | Freq: Once | INTRAMUSCULAR | Status: AC
  Administered 2013-02-10: 20 mg via INTRAVENOUS

## 2013-02-10 MED ORDER — SODIUM CHLORIDE 0.9 % IJ SOLN
10.0000 mL | INTRAMUSCULAR | Status: DC | PRN
  Administered 2013-02-10: 10 mL
  Filled 2013-02-10: qty 10

## 2013-02-10 MED ORDER — ONDANSETRON HCL 8 MG PO TABS: 8.0000 mg | ORAL_TABLET | Freq: Two times a day (BID) | ORAL | Status: DC | PRN

## 2013-02-10 MED ORDER — SODIUM CHLORIDE 0.9 % IV SOLN
600.0000 mg/m2 | Freq: Once | INTRAVENOUS | Status: AC
  Administered 2013-02-10: 940 mg via INTRAVENOUS
  Filled 2013-02-10: qty 47

## 2013-02-10 MED ORDER — HEPARIN SOD (PORK) LOCK FLUSH 100 UNIT/ML IV SOLN
500.0000 [IU] | Freq: Once | INTRAVENOUS | Status: AC | PRN
  Administered 2013-02-10: 500 [IU]
  Filled 2013-02-10: qty 5

## 2013-02-10 MED ORDER — DOCETAXEL CHEMO INJECTION 160 MG/16ML
75.0000 mg/m2 | Freq: Once | INTRAVENOUS | Status: AC
  Administered 2013-02-10: 120 mg via INTRAVENOUS
  Filled 2013-02-10: qty 12

## 2013-02-10 NOTE — Telephone Encounter (Signed)
Per staff message and POF I have scheduled appts.  JMW  

## 2013-02-10 NOTE — Progress Notes (Signed)
ID: [name] OB: 05-18-1961  MR#: [id number]  WUJ#:[id number]  PCP: Allean Found, MD GYN:   SUMikey Bussing Ingram]; Billy Fischer OTHER MD: Rogelia Mire, Lurline Hare, Garald Braver   HISTORY OF PRESENT ILLNESS: [name] had a routine colonoscopy under Dr. Dulce Sellar approximately a year and a half ago, showing a right colon lesion which could not be removed intraoperatively. CT scans of the abdomen and pelvis 07/07/2011 were negative except for the question of a soft tissue lesion in the right colon, and particularly there was no adenopathy or evidence of metastatic disease. She underwent partial colectomy under Dr. Lodema Pilot 07/28/2011, and the pathology from that procedure ([id number]) showed a 3.2 cm tubular adenoma without high-grade dysplasia or malignancy. Margins were negative. 0 of 16 lymph nodes were involved.  And the patient has been working at weight loss and managed to lose approximately 25 pounds over the past year. She feels this is what allowed her to palpate a mass in her right breast earlier this month. (She had had negative mammographic screening 02/26/2012). On 10/27/2012 the patient underwent bilateral diagnostic mammography. The breasts are "extremely dense". Mammography did not show any new or worrisome finding. Right breast ultrasound however located an irregular hypoechoic mass in the area of the patient's palpable abnormality. It measured approximately 7 mm. Biopsy of this mass 10/28/2012 showed an invasive ductal carcinoma, grade 2, estrogen and progesterone receptor positive, with no HER-2 amplification, and an MIB-1 of 5%.  Bilateral breast MRI 11/01/2012 found a 1.3 cm irregular enhancing mass in the lower inner quadrant of the right breast, with no other masses of concern in either breast and no abnormal appearing lymph nodes.  The patient's subsequent history is as detailed below.   INTERVAL HISTORY: Huntleigh returns today for  followup of her right breast cancer accompanied by her husband [name]. She is currently day 1 cycle 3 of 4 planned q. three-week cycles of docetaxel/cyclophosphamide given in the adjuvant setting.  She receives Neulasta on day 2 for granulocyte support.  REVIEW OF SYSTEMS: Roslin has had absolutely no neuropathy, which is the main issue with this kind of treatment. That is very positive. On the other hand she does have fatigue. She is able to continue to work but she has to leave early some days. They just went to the beach and she was able to take some long walks, but by the end of the afternoon she was in bed just resting. Aside from that she has pain from the Neulasta. She does take Claritin but for some reason she has been reluctant to take Motrin. We discussed that at length today. The biggest single issue she is having is continuing reflux symptoms. She was switched from omeprazole to Protonix and Zantac was added. Nevertheless she still has this problem, which causes her to feel short of breath, and causes her to be hoarse. In addition she has been constipated. She is taking MiraLAX and Senokot daily. That is helping. A detailed review of systems today was otherwise stable   PAST MEDICAL HISTORY: Past Medical History  Diagnosis Date  . Asthma     as a child, early adult...none now  . Recurrent upper respiratory infection (URI)     started 1/28.....getting better  . GERD (gastroesophageal reflux disease)     occas  at  night....twice a  month--otc meds  . Depression   . Anxiety   . Colonic mass     ascending  . Breast cancer  10/28/12    right breast    PAST SURGICAL HISTORY: Past Surgical History  Procedure Laterality Date  . Tubal ligation    . Hemicolectomy  07/28/11  . Breast biopsy Right 10/28/12    Invasive mammary ca, ER/PR=+, Her2 Neu-  . Appendectomy      FAMILY HISTORY Family History  Problem Relation Age of Onset  . Heart disease Father   . Cancer Paternal Aunt      unknown form of cancer  . Lung cancer Maternal Grandmother   . Testicular cancer Cousin     paternal cousin; died in 37s  . Pancreatic cancer Cousin 40  . Colon cancer Other     paternal grandmother's sister  . Cancer Other     MGM's sister   the patient's father died from congestive far to failure at the age of 49. The patient's mother is still living, in her early 33s. The patient has one brother, 4 sisters. There is no history of breast or ovarian cancer in the family to the patient's knowledge.  GYNECOLOGIC HISTORY:  Menarche age 72, first live birth age 77. The patient's less. It was September 2013 and the one before that was March 2013. She is not taking hormone replacement. She did take oral contraceptives for several years intermittently in the past. There were no complications.   SOCIAL HISTORY:  Mychael is originally from [place]. 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 She is not a counselor herself, but her husband, Tychelle Purkey (goes by "[name]") works as a Veterinary surgeon for the [employer] as well as having his own clinic in [place]. He is originally from [place]. The patient's children are [name], who lives in [place] and has a Scientist, water quality in counseling; and [name], who is a Consulting civil engineer at [employer]. He lives with the patient, and shares custody of his daughter, who is 62 years old and spends every other weekend at the patient's home.    ADVANCED DIRECTIVES: In place   HEALTH MAINTENANCE: History  Substance Use Topics  . Smoking status: Never Smoker   . Smokeless tobacco: Never Used  . Alcohol Use: Yes     Comment: wine occasionally     Colonoscopy: 06/02/2011 / [facility]  PAP:  Bone density:  Lipid panel:  No Known Allergies  Current Outpatient Prescriptions  Medication Sig Dispense Refill  . amphetamine-dextroamphetamine (ADDERALL XR) 15 MG 24 hr capsule Take 15 mg by mouth every morning.       Marland Kitchen buPROPion (WELLBUTRIN XL) 150 MG 24 hr tablet Take 150 mg by mouth every morning.      . clindamycin-benzoyl peroxide (BENZACLIN) gel Apply 1 application topically at bedtime as needed (for face).       . clonazePAM (KLONOPIN) 0.5 MG tablet Take 0.5 mg by mouth 3 (three) times daily as needed for anxiety.       Marland Kitchen dexamethasone (DECADRON) 4 MG tablet Take 8 mg (2 x 4mg  tablets) by mouth at 8am & 5pm 1 day BEFORE chemo, 8am & 5pm on Day 2, 8am on Day 3, then as directed.      Marland Kitchen ibuprofen (ADVIL,MOTRIN) 200 MG tablet Take 200 mg by mouth every 6 (six) hours as needed for pain.      Marland Kitchen lidocaine-prilocaine (EMLA) cream Apply topically as needed. Apply over port site 2 hours before chemo; cover with saran wrap or similar  30 g  0  . loratadine (CLARITIN) 10 MG tablet Take one tablet  by mouth the morning of neulasta (day 2 chemo) and the next 3 days  30 tablet  2  . omeprazole (PRILOSEC) 20 MG capsule Take 20 mg by mouth daily before breakfast. Take 20 mg by mouth nightly.      . ondansetron (ZOFRAN) 8 MG tablet Take 8 mg by mouth every 12 (twelve) hours as needed for nausea.       Marland Kitchen oxybutynin (DITROPAN-XL) 10 MG 24 hr tablet Take 10 mg by mouth daily.       . pantoprazole (PROTONIX) 40 MG tablet Take 1 tablet (40 mg total) by mouth daily.  30 tablet  3  . pegfilgrastim (NEULASTA) 6 MG/0.6ML injection Inject 6 mg into the skin once. Day after chemo treaments      . polyethylene glycol (MIRALAX / GLYCOLAX) packet Take 17 g by mouth daily as needed.      . prochlorperazine (COMPAZINE) 10 MG tablet Take 10 mg by mouth every 6 (six) hours as needed (for nasue and vomiting).       . ranitidine (ZANTAC) 150 MG tablet Take 150 mg by mouth daily with supper.      . traZODone (DESYREL) 150 MG tablet Take 150 mg by mouth at bedtime.        No current facility-administered medications for this visit.    OBJECTIVE: Middle-aged Latin American woman who appears stated age 60 Vitals:   02/10/13 0839  BP:  99/63  Pulse: 97  Temp: 97.5 F (36.4 C)  Resp: 20     Body mass index is 21.46 kg/(m^2).    ECOG FS: 1 Filed Weights   02/10/13 0839  Weight: 121 lb 1.6 oz (54.931 kg)   Sclerae unicteric Oropharynx clear No cervical or supraclavicular adenopathy Lungs clear to auscultation bilaterally, no rales or rhonchi Heart regular rate and rhythm Abd benign MSK no focal spinal tenderness, no peripheral edema Neuro: nonfocal, well oriented, pleasant affect Breasts: The right breast is status post lumpectomy and axillary sentinel lymph node sampling. The incisions have healed very nicely. The cosmetic result is excellent. There are no suspicious masses, skin changes, or nipple changes. The right axilla is benign. The left breast is unremarkable.   LAB RESULTS:   Lab Results  Component Value Date   WBC 13.2* 02/10/2013   NEUTROABS 11.0* 02/10/2013   HGB 11.2* 02/10/2013   HCT 33.4* 02/10/2013   MCV 86.0 02/10/2013   PLT 275 02/10/2013      Chemistry      Component Value Date/Time   NA 138 01/20/2013 0910   NA 139 07/30/2011 0615   K 3.6 01/20/2013 0910   K 4.1 07/30/2011 0615   CL 102 11/03/2012 0821   CL 104 07/30/2011 0615   CO2 24 01/20/2013 0910   CO2 25 07/30/2011 0615   BUN 16.1 01/20/2013 0910   BUN 5* 07/30/2011 0615   CREATININE 0.8 01/20/2013 0910   CREATININE 0.68 07/30/2011 0615      Component Value Date/Time   CALCIUM 9.8 01/20/2013 0910   CALCIUM 8.8 07/30/2011 0615   ALKPHOS 56 01/20/2013 0910   ALKPHOS 56 07/22/2011 0845   AST 11 01/20/2013 0910   AST 14 07/22/2011 0845   ALT 7 01/20/2013 0910   ALT 14 07/22/2011 0845   BILITOT 0.45 01/20/2013 0910   BILITOT 0.3 07/22/2011 0845       STUDIES:  Ir Fluoro Guide Cv Line Left  01/12/2013   *RADIOLOGY REPORT*  Indication: History of right-sided breast cancer, in  the aid of intravenous access for chemotherapy administration  IMPLANTED PORT A CATH PLACEMENT WITH ULTRASOUND AND FLUOROSCOPIC GUIDANCE  Comparison: None  Sedation: Versed 2 mg IV;  Fentanyl 100 mcg IV; Ancef 2 gm IV; IV antibiotic was given in an appropriate time interval prior to skin puncture.  Total Moderate Sedation Time: 30 minutes.  Contrast: None  Fluoroscopy Time: 30-seconds  Complications: None immediate  Procedure:  The procedure, risks, benefits, and alternatives were explained to the patient.  Questions regarding the procedure were encouraged and answered.  The patient understands and consents to the procedure.  Given the presence of right-sided breast cancer, the decision was made to place a left anterior chest wall internal jugular approach port catheter.  As such, the left neck and chest were prepped with chlorhexidine in a sterile fashion, and a sterile drape was applied covering the operative field.  Maximum barrier sterile technique with sterile gowns and gloves were used for the procedure.  A timeout was performed prior to the initiation of the procedure. Local anesthesia was provided with 1% lidocaine with epinephrine.  After creating a small venotomy incision, a micropuncture kit was utilized to access the right internal jugular vein under direct, real-time ultrasound guidance.  Ultrasound image documentation was performed.  The microwire was kinked to measure appropriate catheter length.  A subcutaneous port pocket was then created along the upper chest wall utilizing a combination of sharp and blunt dissection.  The pocket was irrigated with sterile saline.  A single lumen ISP power injectable port was chosen for placement.  The 8 Fr catheter was tunneled from the port pocket site to the venotomy incision.  The port was placed in the pocket.  The external catheter was trimmed to appropriate length.  At the venotomy, an 8 Fr peel-away sheath was placed over a guidewire under fluoroscopic guidance.  The catheter was then placed through the sheath and the sheath was removed.  Final catheter positioning was confirmed and documented with a fluoroscopic spot radiograph.  The  port was accessed with a Huber needle, aspirated and flushed with heparinized saline.  The venotomy site was closed with an interrupted 4-0 Vicryl suture. The port pocket incision was closed with interrupted 2-0 Vicryl suture and the skin was opposed with a running subcuticular 4-0 Vicryl suture.  Dermabond and Steri-strips were applied to both incisions.  Dressings were placed.  The patient tolerated the procedure well without immediate post procedural complication.  Findings:  After catheter placement, the tip lies at the superior cavoatrial junction.  The catheter aspirates and flushes normally and is ready for immediate use.  IMPRESSION:  Successful placement of a left internal jugular approach single lumen power injectable Port-A-Cath.  The catheter is ready for immediate use.   Original Report Authenticated By: Tacey Ruiz, MD   Ir US Guide Vasc Access Left  01/12/2013   *RADIOLOGY REPORT*  Indication: History of right-sided breast cancer, in the aid of intravenous access for chemotherapy administration  IMPLANTED PORT A CATH PLACEMENT WITH ULTRASOUND AND FLUOROSCOPIC GUIDANCE  Comparison: None  Sedation: Versed 2 mg IV; Fentanyl 100 mcg IV; Ancef 2 gm IV; IV antibiotic was given in an appropriate time interval prior to skin puncture.  Total Moderate Sedation Time: 30 minutes.  Contrast: None  Fluoroscopy Time: 30-seconds  Complications: None immediate  Procedure:  The procedure, risks, benefits, and alternatives were explained to the patient.  Questions regarding the procedure were encouraged and answered.  The patient understands and consents to  the procedure.  Given the presence of right-sided breast cancer, the decision was made to place a left anterior chest wall internal jugular approach port catheter.  As such, the left neck and chest were prepped with chlorhexidine in a sterile fashion, and a sterile drape was applied covering the operative field.  Maximum barrier sterile technique with sterile  gowns and gloves were used for the procedure.  A timeout was performed prior to the initiation of the procedure. Local anesthesia was provided with 1% lidocaine with epinephrine.  After creating a small venotomy incision, a micropuncture kit was utilized to access the right internal jugular vein under direct, real-time ultrasound guidance.  Ultrasound image documentation was performed.  The microwire was kinked to measure appropriate catheter length.  A subcutaneous port pocket was then created along the upper chest wall utilizing a combination of sharp and blunt dissection.  The pocket was irrigated with sterile saline.  A single lumen ISP power injectable port was chosen for placement.  The 8 Fr catheter was tunneled from the port pocket site to the venotomy incision.  The port was placed in the pocket.  The external catheter was trimmed to appropriate length.  At the venotomy, an 8 Fr peel-away sheath was placed over a guidewire under fluoroscopic guidance.  The catheter was then placed through the sheath and the sheath was removed.  Final catheter positioning was confirmed and documented with a fluoroscopic spot radiograph.  The port was accessed with a Huber needle, aspirated and flushed with heparinized saline.  The venotomy site was closed with an interrupted 4-0 Vicryl suture. The port pocket incision was closed with interrupted 2-0 Vicryl suture and the skin was opposed with a running subcuticular 4-0 Vicryl suture.  Dermabond and Steri-strips were applied to both incisions.  Dressings were placed.  The patient tolerated the procedure well without immediate post procedural complication.  Findings:  After catheter placement, the tip lies at the superior cavoatrial junction.  The catheter aspirates and flushes normally and is ready for immediate use.  IMPRESSION:  Successful placement of a left internal jugular approach single lumen power injectable Port-A-Cath.  The catheter is ready for immediate use.    Original Report Authenticated By: Tacey Ruiz, MD     ASSESSMENT: 52 y.o. [place] woman status post right breast lower inner quadrant biopsy 10/28/2012 for a clinical T1c N0, stage IA invasive ductal carcinoma, grade 2, estrogen and progesterone receptor positive, HER-2 not amplified, with an MIB-1 of 20%  (1) s/p Right lumpectomy and sentinel lymph node sampling 11/17/2012 for a pT2 pN0, stage IIA invasive ductal carcinoma with prominent lobular features, grade 2, with initially positive  margins cleared intraoperatively; estrogen receptor positive with an Allred score of 7, progesterone receptor positive with an Allred score of 8, HercepTest 0, proliferation index by ACIS III 32% ([id number])   (2) Oncotype DX score of 33 predicting a risk of distant recurrence within 10 years of 23% if her only systemic treatment is tamoxifen for 5 years; the estrogen receptor was interpreted as negative on the Oncotype report  (3) adjuvant cyclophosphamide and docetaxel started 12/30/2012, 4 cycles planned, with Neulasta support on day 2.  First cycle given at [facility], with the remaining cycles planned at [facility].   PLAN:  Raymonda is doing well with the chemotherapy. We are going to work on the reflux issues, and she is going to continue the Protonix daily and the Zantac as needed. I am switching her from Compazine to Reglan  and she will take Reglan 5 mg before meals running. Days 12 and 3 of chemotherapy the Reglan dose will be 10 mg 3 times a day before meals. I am hopeful that alone will take care of most of the problem. She will continue on MiraLAX and senna for the constipation and she will take Motrin or Aleve for the pain she gets with the Neulasta.  Otherwise she will have her third cycle of chemotherapy today and her final 1 September 18. I am making her an appointment with Dr. Michell Heinrich the third week in September to start planning her radiation treatments. Lowella Dell, MD   02/10/2013 9:27  AM

## 2013-02-10 NOTE — Telephone Encounter (Signed)
appts made and printed...td 

## 2013-02-10 NOTE — Patient Instructions (Addendum)
[facility] Discharge Instructions for Patients Receiving Chemotherapy  Today you received the following chemotherapy agents TAXOTERE, CYTOXAN  To help prevent nausea and vomiting after your treatment, we encourage you to take your nausea medication IF NEEDED   If you develop nausea and vomiting that is not controlled by your nausea medication, call the clinic.   BELOW ARE SYMPTOMS THAT SHOULD BE REPORTED IMMEDIATELY:  *FEVER GREATER THAN 100.5 F  *CHILLS WITH OR WITHOUT FEVER  NAUSEA AND VOMITING THAT IS NOT CONTROLLED WITH YOUR NAUSEA MEDICATION  *UNUSUAL SHORTNESS OF BREATH  *UNUSUAL BRUISING OR BLEEDING  TENDERNESS IN MOUTH AND THROAT WITH OR WITHOUT PRESENCE OF ULCERS  *URINARY PROBLEMS  *BOWEL PROBLEMS  UNUSUAL RASH Items with * indicate a potential emergency and should be followed up as soon as possible.  Feel free to call the clinic you have any questions or concerns. The clinic phone number is [phone].    

## 2013-02-11 ENCOUNTER — Ambulatory Visit (HOSPITAL_BASED_OUTPATIENT_CLINIC_OR_DEPARTMENT_OTHER): Payer: 59

## 2013-02-11 VITALS — BP 99/36 | HR 71 | Temp 97.4°F

## 2013-02-11 DIAGNOSIS — C50319 Malignant neoplasm of lower-inner quadrant of unspecified female breast: Secondary | ICD-10-CM

## 2013-02-11 DIAGNOSIS — C50911 Malignant neoplasm of unspecified site of right female breast: Secondary | ICD-10-CM

## 2013-02-11 DIAGNOSIS — C50311 Malignant neoplasm of lower-inner quadrant of right female breast: Secondary | ICD-10-CM

## 2013-02-11 DIAGNOSIS — Z5189 Encounter for other specified aftercare: Secondary | ICD-10-CM

## 2013-02-11 MED ORDER — PEGFILGRASTIM INJECTION 6 MG/0.6ML
6.0000 mg | Freq: Once | SUBCUTANEOUS | Status: AC
  Administered 2013-02-11: 6 mg via SUBCUTANEOUS
  Filled 2013-02-11: qty 0.6

## 2013-02-11 NOTE — Addendum Note (Signed)
Addended by: Billey Co on: 02/11/2013 09:15 AM   Modules accepted: Orders, Medications

## 2013-02-15 ENCOUNTER — Encounter: Payer: Self-pay | Admitting: Oncology

## 2013-02-17 ENCOUNTER — Encounter: Payer: Self-pay | Admitting: Physician Assistant

## 2013-02-17 ENCOUNTER — Telehealth: Payer: Self-pay | Admitting: Oncology

## 2013-02-17 ENCOUNTER — Ambulatory Visit (HOSPITAL_BASED_OUTPATIENT_CLINIC_OR_DEPARTMENT_OTHER): Payer: 59 | Admitting: Physician Assistant

## 2013-02-17 ENCOUNTER — Other Ambulatory Visit (HOSPITAL_BASED_OUTPATIENT_CLINIC_OR_DEPARTMENT_OTHER): Payer: 59 | Admitting: Lab

## 2013-02-17 VITALS — BP 95/62 | HR 87 | Temp 98.4°F | Resp 18 | Ht 63.0 in | Wt 122.5 lb

## 2013-02-17 DIAGNOSIS — K5900 Constipation, unspecified: Secondary | ICD-10-CM | POA: Insufficient documentation

## 2013-02-17 DIAGNOSIS — D649 Anemia, unspecified: Secondary | ICD-10-CM

## 2013-02-17 DIAGNOSIS — B370 Candidal stomatitis: Secondary | ICD-10-CM

## 2013-02-17 DIAGNOSIS — C50319 Malignant neoplasm of lower-inner quadrant of unspecified female breast: Secondary | ICD-10-CM

## 2013-02-17 DIAGNOSIS — C50311 Malignant neoplasm of lower-inner quadrant of right female breast: Secondary | ICD-10-CM

## 2013-02-17 DIAGNOSIS — K219 Gastro-esophageal reflux disease without esophagitis: Secondary | ICD-10-CM

## 2013-02-17 LAB — COMPREHENSIVE METABOLIC PANEL (CC13)
Alkaline Phosphatase: 64 U/L (ref 40–150)
BUN: 9.3 mg/dL (ref 7.0–26.0)
Glucose: 123 mg/dl (ref 70–140)
Total Bilirubin: 0.36 mg/dL (ref 0.20–1.20)

## 2013-02-17 LAB — CBC WITH DIFFERENTIAL/PLATELET
Basophils Absolute: 0.1 10*3/uL (ref 0.0–0.1)
Eosinophils Absolute: 0.1 10*3/uL (ref 0.0–0.5)
LYMPH%: 28.8 % (ref 14.0–49.7)
MCH: 28.8 pg (ref 25.1–34.0)
MCV: 86.3 fL (ref 79.5–101.0)
MONO%: 15.6 % — ABNORMAL HIGH (ref 0.0–14.0)
NEUT#: 1.9 10*3/uL (ref 1.5–6.5)
Platelets: 166 10*3/uL (ref 145–400)
RBC: 3.53 10*6/uL — ABNORMAL LOW (ref 3.70–5.45)

## 2013-02-17 MED ORDER — FLUCONAZOLE 100 MG PO TABS: 100.0000 mg | ORAL_TABLET | Freq: Every day | ORAL | Status: DC

## 2013-02-17 NOTE — Progress Notes (Signed)
ID: [name] OB: 11/23/1960  MR#: [id number]  FAO#:[id number]  PCP: Elizabeth Found, MD GYN:   SUMikey Bussing Gonzalez]; [name] OTHER MD: [name], [name], [name]   HISTORY OF PRESENT ILLNESS: [name] had a routine colonoscopy under Dr. Dulce Gonzalez approximately a year and a half ago, showing a right colon lesion which could not be removed intraoperatively. CT scans of the abdomen and pelvis 07/07/2011 were negative except for the question of a soft tissue lesion in the right colon, and particularly there was no adenopathy or evidence of metastatic disease. She underwent partial colectomy under Dr. Lodema Gonzalez 07/28/2011, and the pathology from that procedure ([id number]) showed a 3.2 cm tubular adenoma without high-grade dysplasia or malignancy. Margins were negative. 0 of 16 lymph nodes were involved.  And the patient has been working at weight loss and managed to lose approximately 25 pounds over the past year. She feels this is what allowed her to palpate a mass in her right breast earlier this month. (She had had negative mammographic screening 02/26/2012). On 10/27/2012 the patient underwent bilateral diagnostic mammography. The breasts are "extremely dense". Mammography did not show any new or worrisome finding. Right breast ultrasound however located an irregular hypoechoic mass in the area of the patient's palpable abnormality. It measured approximately 7 mm. Biopsy of this mass 10/28/2012 showed an invasive ductal carcinoma, grade 2, estrogen and progesterone receptor positive, with no HER-2 amplification, and an MIB-1 of 5%.  Bilateral breast MRI 11/01/2012 Gonzalez a 1.3 cm irregular enhancing mass in the lower inner quadrant of the right breast, with no other masses of concern in either breast and no abnormal appearing lymph nodes.  The patient's subsequent history is as detailed below.   INTERVAL HISTORY: [name]  accompanied by her husband [name] for followup of her right breast cancer . She is currently day 8 cycle 3 of 4 planned q. three-week cycles of docetaxel/cyclophosphamide given in the adjuvant setting.  She receives Neulasta on day 2 for granulocyte support.  She continues to have some problems with reflux, and had some confusion on how to take her medications, specifically the protonic, Zantac, and Reglan. We reviewed all of this Gonzalez. She continues to have some constipation, but manages to have bowel movements with the use of MiraLAX and stool softeners. Her biggest complaint Gonzalez is fatigue.  REVIEW OF SYSTEMS: [name] has had no fevers or chills. She's had no skin changes or rashes. She denies any signs of abnormal bruising or bleeding. Her appetite is reduced. She's had no problems with nausea or emesis. She has some mild abdominal cramping, usually just before a bowel movement. She denies any cough, phlegm production, or chest pain. She has some mild shortness of breath with exertion. She's had no abnormal headaches. Her vision is a little blurred. She has some diffuse joint pain and bony pain which she attributes to the Neulasta injection. She takes Claritin appropriately which helps. She's had no peripheral swelling, and she continues to deny any signs of peripheral neuropathy in either the upper or lower extremities.  A detailed review of systems is otherwise stable and noncontributory.    PAST MEDICAL HISTORY: Past Medical History  Diagnosis Date  . Asthma     as a child, early adult...none now  . Recurrent upper respiratory infection (URI)     started 1/28.....getting better  . GERD (gastroesophageal reflux disease)     occas  at  night....twice a  month--otc  meds  . Depression   . Anxiety   . Colonic mass     ascending  . Breast cancer 10/28/12    right breast    PAST SURGICAL HISTORY: Past Surgical History  Procedure Laterality Date  . Tubal ligation    . Hemicolectomy   07/28/11  . Breast biopsy Right 10/28/12    Invasive mammary ca, ER/PR=+, Her2 Neu-  . Appendectomy      FAMILY HISTORY Family History  Problem Relation Age of Onset  . Heart disease Father   . Cancer Paternal Aunt     unknown form of cancer  . Lung cancer Maternal Grandmother   . Testicular cancer Cousin     paternal cousin; died in 66s  . Pancreatic cancer Cousin 40  . Colon cancer Other     paternal grandmother's sister  . Cancer Other     MGM's sister   the patient's father died from congestive far to failure at the age of 34. The patient's mother is still living, in her early 43s. The patient has one brother, 4 sisters. There is no history of breast or ovarian cancer in the family to the patient's knowledge.  GYNECOLOGIC HISTORY:  Menarche age 41, first live birth age 35. The patient's less. It was September 2013 and the one before that was March 2013. She is not taking hormone replacement. She did take oral contraceptives for several years intermittently in the past. There were no complications.   SOCIAL HISTORY:  [name] is originally from [place]. 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 She is not a counselor herself, but her husband, [name] (goes by "[name]") works as a Veterinary surgeon for the [employer] as well as having his own clinic in [place]. He is originally from [place]. The patient's children are [name], who lives in [place] and has a Scientist, water quality in counseling; and [name], who is a Consulting civil engineer at [employer] He lives with the patient, and shares custody of his daughter, who is 34 years old and spends every other weekend at the patient's home.    ADVANCED DIRECTIVES: In place   HEALTH MAINTENANCE: History  Substance Use Topics  . Smoking status: Never Smoker   . Smokeless tobacco: Never Used  . Alcohol Use: Yes     Comment: wine occasionally     Colonoscopy: 06/02/2011 /  Outlaw  PAP:  Bone density:  Lipid panel:  No Known Allergies  Current Outpatient Prescriptions  Medication Sig Dispense Refill  . amphetamine-dextroamphetamine (ADDERALL XR) 15 MG 24 hr capsule Take 30 mg by mouth every morning.       Marland Kitchen buPROPion (WELLBUTRIN XL) 150 MG 24 hr tablet Take 150 mg by mouth every morning.      . clindamycin-benzoyl peroxide (BENZACLIN) gel Apply 1 application topically at bedtime as needed (for face).       . clonazePAM (KLONOPIN) 0.5 MG tablet Take 0.5 mg by mouth 3 (three) times daily as needed for anxiety.       Marland Kitchen dexamethasone (DECADRON) 4 MG tablet Take 8 mg (2 x 4mg  tablets) by mouth at 8am & 5pm 1 day BEFORE chemo, 8am & 5pm on Day 2, 8am on Day 3, then as directed.      Marland Kitchen ibuprofen (ADVIL,MOTRIN) 200 MG tablet Take 200 mg by mouth every 6 (six) hours as needed for pain.      Marland Kitchen lidocaine-prilocaine (EMLA) cream Apply topically as needed. Apply over port site 2 hours  before chemo; cover with saran wrap or similar  30 g  0  . loratadine (CLARITIN) 10 MG tablet Take one tablet by mouth the morning of neulasta (day 2 chemo) and the next 3 days  30 tablet  2  . metoCLOPramide (REGLAN) 10 MG tablet Take 0.5 tablets (5 mg total) by mouth 4 (four) times daily.  90 tablet  6  . ondansetron (ZOFRAN) 8 MG tablet Take 1 tablet (8 mg total) by mouth every 12 (twelve) hours as needed for nausea.  20 tablet  3  . oxybutynin (DITROPAN-XL) 10 MG 24 hr tablet Take 10 mg by mouth daily.       . pantoprazole (PROTONIX) 40 MG tablet       . pegfilgrastim (NEULASTA) 6 MG/0.6ML injection Inject 6 mg into the skin once. Day after chemo treaments      . polyethylene glycol (MIRALAX / GLYCOLAX) packet Take 17 g by mouth daily as needed.      . ranitidine (ZANTAC) 150 MG tablet Take 150 mg by mouth daily with supper.      . senna-docusate (SENNA S) 8.6-50 MG per tablet Take 1 tablet by mouth daily.  50 tablet  6  . traZODone (DESYREL) 150 MG tablet Take 150 mg by mouth at bedtime.        . fluconazole (DIFLUCAN) 100 MG tablet Take 1 tablet (100 mg total) by mouth daily.  7 tablet  1   No current facility-administered medications for this visit.    OBJECTIVE: Middle-aged Latin American woman who appears stated age and is in no acute distress Filed Vitals:   02/17/13 1342  BP: 95/62  Pulse: 87  Temp: 98.4 F (36.9 C)  Resp: 18     Body mass index is 21.71 kg/(m^2).    ECOG FS: 1 Filed Weights   02/17/13 1342  Weight: 122 lb 8 oz (55.566 kg)   Sclerae unicteric Oropharynx notable for white coating on the tongue and buccal mucosa, consistent with oropharyngeal candidiasis. No ulcerations noted. No cervical or supraclavicular adenopathy Lungs clear to auscultation bilaterally, no rales or rhonchi Heart regular rate and rhythm Abdomen soft, nontender to palpation, positive and slightly hyperactive bowel sounds.  MSK no focal spinal tenderness, no peripheral edema Neuro: nonfocal, well oriented, pleasant affect Breasts: Deferred. Axillae are benign bilaterally, no palpable adenopathy.    LAB RESULTS:   Lab Results  Component Value Date   WBC 3.8* 02/17/2013   NEUTROABS 1.9 02/17/2013   HGB 10.2* 02/17/2013   HCT 30.4* 02/17/2013   MCV 86.3 02/17/2013   PLT 166 02/17/2013      Chemistry      Component Value Date/Time   NA 138 02/17/2013 1329   NA 139 07/30/2011 0615   K 4.6 02/17/2013 1329   K 4.1 07/30/2011 0615   CL 102 11/03/2012 0821   CL 104 07/30/2011 0615   CO2 28 02/17/2013 1329   CO2 25 07/30/2011 0615   BUN 9.3 02/17/2013 1329   BUN 5* 07/30/2011 0615   CREATININE 0.7 02/17/2013 1329   CREATININE 0.68 07/30/2011 0615      Component Value Date/Time   CALCIUM 9.4 02/17/2013 1329   CALCIUM 8.8 07/30/2011 0615   ALKPHOS 64 02/17/2013 1329   ALKPHOS 56 07/22/2011 0845   AST 14 02/17/2013 1329   AST 14 07/22/2011 0845   ALT 15 02/17/2013 1329   ALT 14 07/22/2011 0845   BILITOT 0.36 02/17/2013 1329   BILITOT 0.3 07/22/2011 0845  STUDIES:  No results  Gonzalez.     ASSESSMENT: 52 y.o. [place] woman status post right breast lower inner quadrant biopsy 10/28/2012 for a clinical T1c N0, stage IA invasive ductal carcinoma, grade 2, estrogen and progesterone receptor positive, HER-2 not amplified, with an MIB-1 of 20%  (1) s/p Right lumpectomy and sentinel lymph node sampling 11/17/2012 for a pT2 pN0, stage IIA invasive ductal carcinoma with prominent lobular features, grade 2, with initially positive  margins cleared intraoperatively; estrogen receptor positive with an Allred score of 7, progesterone receptor positive with an Allred score of 8, HercepTest 0, proliferation index by ACIS III 32% ([id number])   (2) Oncotype DX score of 33 predicting a risk of distant recurrence within 10 years of 23% if her only systemic treatment is tamoxifen for 5 years; the estrogen receptor was interpreted as negative on the Oncotype report  (3) adjuvant cyclophosphamide and docetaxel started 12/30/2012, 4 cycles planned, with Neulasta support on day 2.  [facility] cycle given at [facility], with the remaining cycles to be given  at [facility].  (4)  following chemotherapy, the patient will proceed to radiation therapy, followed by antiestrogen therapy.   PLAN:  Nilsa continues to tolerate her chemotherapy quite well. The reflux and constipation continue to be her biggest problems as noted above. I have given her written instructions on how to take the Protonix, Zantac, and Reglan appropriately. Specifically, she'll continue to take the Protonix every day. She'll take Zantac as needed for additional symptoms of reflux. She will take the Reglan, 10 mg with meals and bedtime x3 days after each cycle of chemotherapy. (She is taking Reglan in place of Compazine.) Otherwise, she will take half a tablet of the Reglan, 5 mg, 3 times daily with meals. I also encouraged her to try Gas-X for the gas and abdominal cramping. She will continue to use MiraLAX and stool softeners as needed to  maintain regular bowel movements.  I am also prescribing Diflucan, 100 mg by mouth daily for 7 days for oral thrush. She can continue to use by Biotene mouthwash, and I also encouraged her to utilize baking soda rinses several times daily.  Lari is scheduled to meet with Dr. Michell Heinrich on September 17. She'll receive her fourth and final dose of adjuvant chemotherapy on September 18, and I will see her just prior to that treatment for repeat labs and physical exam. All this was reviewed with the patient and her husband, both of whom voice understanding and agreement with our plan. She will call with any changes or problems prior to her next scheduled appointment.   Phoebie Shad, PA-C   02/17/2013 2:18 PM

## 2013-02-17 NOTE — Telephone Encounter (Signed)
, °

## 2013-02-18 ENCOUNTER — Telehealth: Payer: Self-pay | Admitting: *Deleted

## 2013-02-18 NOTE — Telephone Encounter (Signed)
Spouse called reporting their daughter returned from [place] 02-13-2012 with swolen lymph nodes.  Today learned she is positive for mononucleosis.  Asking what to do to protect his wife from exposure.  Wife is free of symptoms but she and my daughter have been sleeping in the same bed together.  Instructed to not share any kisses, drinks, food, eating utensils, glasses etcettera for several days.  Wash and change pillow cases.  And monitor Bindu for headache, fever, swollen lymph nodes, sore throat and to call if these occur.  Will notify providers.

## 2013-02-28 ENCOUNTER — Telehealth: Payer: Self-pay | Admitting: *Deleted

## 2013-02-28 NOTE — Telephone Encounter (Signed)
Checked in on pt.  Doing well.  Discussed 4th cycle of chemotherapy.  Gave emotional support.  Confirmed appts for this week.  Pt denies further needs.  Contact information given.

## 2013-03-02 ENCOUNTER — Ambulatory Visit
Admission: RE | Admit: 2013-03-02 | Discharge: 2013-03-02 | Disposition: A | Discharge status: Home / Self Care | Payer: 59 | Source: Ambulatory Visit | Attending: Radiation Oncology | Admitting: Radiation Oncology

## 2013-03-02 VITALS — BP 100/39 | HR 83 | Temp 98.1°F | Wt 123.8 lb

## 2013-03-02 DIAGNOSIS — C50311 Malignant neoplasm of lower-inner quadrant of right female breast: Secondary | ICD-10-CM

## 2013-03-02 DIAGNOSIS — Z79899 Other long term (current) drug therapy: Secondary | ICD-10-CM | POA: Insufficient documentation

## 2013-03-02 DIAGNOSIS — C50919 Malignant neoplasm of unspecified site of unspecified female breast: Secondary | ICD-10-CM | POA: Insufficient documentation

## 2013-03-02 NOTE — Progress Notes (Signed)
Please see the Nurse Progress Note in the MD Initial Consult Encounter for this patient. 

## 2013-03-02 NOTE — Progress Notes (Addendum)
Location of Breast Cancer: 10/28/12 biopsy : Right Breast, far posterior aspect of lower inner quadrant  Diagnosis   Breast, right, needle core biopsy  - INVASIVE MAMMARY CARCINOMA, grade 2,  Histology per pathology report: 10/28/12=   Receptor Status: ER(+), PR (+), Her2-neu (-)  Did patient present with symptoms :found after losing 25 lbs ,palpated a mass right breast, 10/27/12 B/L diagnostic mammography= very dense breast, didn't show any new or worrisome finding,then had U/S =irregular hypoechoic mass Of Patients palpation , 7mm,   Past/Anticipated interventions by surgeon, if [id number] 11/11/12 appt   Past/Anticipated interventions by medical oncology, if any: Chemotherapy send for Oncotype DX ,return appt 12/03/12, genetic counseling 12/20/12, ,2nd opinion scheduled at [facility] June 2014?   Lymphedema issues, if any: NO   Pain issues, if any: Yes, tightness under arm, incision well healed under axilla and breast, slight yellow/grren bruisng on breast  SAFETY ISSUES:  Prior radiation? {NO  Pacemaker/ICD? no  Possible current pregnancy?no  Is the patient on methotrexate? 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 Esmeralda Links, [facility], has appt with oncologist 7./3/14 to discuss oncotype results  McElroy, Gloriann Loan, RN  11/30/2012,10:27 AM  Patient here today with husband as follow up new consult to discuss radiation planning.Last chemotherapy (4th cycle) of cytoxan and taxol  scheduled for tomorrow 03/03/13.Tolerated chemotherapy well except for reflux and fatigue.Denies pain or nausea.Plans to take 5 day trip to [facility] after completion of chemotherapy.Has 2 sick adult children at home, one with the flu and the other has mono.Medications and allergies reviewed no other health problems since seen 12/01/12.Given What You need to know about ct simulation.

## 2013-03-02 NOTE — Progress Notes (Signed)
Department of Radiation Oncology  Phone:  [phone] Fax:        [phone]   Name: [name] MRN: [id number]  DOB: 09-Jul-1960  Date: 03/02/2013  Follow Up Visit Note  Diagnosis: T2N0 Invasive Ductal Carcinoma of the right breast  Interval History: [name] presents today for routine followup.  He is daily and also with [name] chemotherapy. [name] has [name] last cycle planned for tomorrow. [name] is excited about starting radiation. It [name] is scheduled to visit [name] family in [place] of the week after chemotherapy. Accompanied by [name] husband today.  Allergies: No Known Allergies  Medications:  Current Outpatient Prescriptions  Medication Sig Dispense Refill  . amphetamine-dextroamphetamine (ADDERALL XR) 15 MG 24 hr capsule Take 30 mg by mouth every morning.       Marland Kitchen buPROPion (WELLBUTRIN XL) 150 MG 24 hr tablet Take 150 mg by mouth every morning.      . clindamycin-benzoyl peroxide (BENZACLIN) gel Apply 1 application topically at bedtime as needed (for face).       . clonazePAM (KLONOPIN) 0.5 MG tablet Take 0.5 mg by mouth 3 (three) times daily as needed for anxiety.       Marland Kitchen dexamethasone (DECADRON) 4 MG tablet Take 8 mg (2 x 4mg  tablets) by mouth at 8am & 5pm 1 day BEFORE chemo, 8am & 5pm on Day 2, 8am on Day 3, then as directed.      Marland Kitchen ibuprofen (ADVIL,MOTRIN) 200 MG tablet Take 200 mg by mouth every 6 (six) hours as needed for pain.      Marland Kitchen lidocaine-prilocaine (EMLA) cream Apply topically as needed. Apply over port site 2 hours before chemo; cover with saran wrap or similar  30 g  0  . loratadine (CLARITIN) 10 MG tablet Take one tablet by mouth the morning of neulasta (day 2 chemo) and the next 3 days  30 tablet  2  . metoCLOPramide (REGLAN) 10 MG tablet Take 0.5 tablets (5 mg total) by mouth 4 (four) times daily.  90 tablet  6  . ondansetron (ZOFRAN) 8 MG tablet Take 1 tablet (8 mg total) by mouth every 12 (twelve) hours as needed for nausea.  20 tablet  3  . oxybutynin  (DITROPAN-XL) 10 MG 24 hr tablet Take 10 mg by mouth daily.       . pantoprazole (PROTONIX) 40 MG tablet       . pegfilgrastim (NEULASTA) 6 MG/0.6ML injection Inject 6 mg into the skin once. Day after chemo treaments      . polyethylene glycol (MIRALAX / GLYCOLAX) packet Take 17 g by mouth daily as needed.      . ranitidine (ZANTAC) 150 MG tablet Take 150 mg by mouth daily with supper.      . senna-docusate (SENNA S) 8.6-50 MG per tablet Take 1 tablet by mouth daily.  50 tablet  6  . traZODone (DESYREL) 150 MG tablet Take 150 mg by mouth at bedtime.       . fluconazole (DIFLUCAN) 100 MG tablet Take 1 tablet (100 mg total) by mouth daily.  7 tablet  1   No current facility-administered medications for this encounter.    Physical Exam:  Filed Vitals:   03/02/13 0842  BP: 100/39  Pulse: 83  Temp: 98.1 F (36.7 C)   [name] is a pleasant female in no distress sitting comfortably examining table. [name] has alopecia. [name] right breast has a well-healed surgical incision.  IMPRESSION: [name] is a 52 y.o. female status post  lumpectomy and chemotherapy were a T2 N0 right breast cancer  PLAN:  Discussed again the role of radiation and decreasing local failures in patients who undergo lumpectomy. We discussed the process of simulation the placement tattoos. We discussed 6 weeks of treatment. We discussed my concern for hyperfractionated treatment in the postchemotherapy breast could result in decreased cosmetic outcome is increased fibrosis. We discussed the possibility of a symptomatic lung damage. We discussed the low likelihood of secondary malignancies and rib fracture. We discussed skin redness and fatigue as acute side effects of treatment. I have scheduled [name] for simulation the first week of October. [name] would like to be finished before Thanksgiving. We will schedule [name] for 30 fractions with 5 weeks to the breast and a week of boost.    Lurline Hare, MD

## 2013-03-03 ENCOUNTER — Other Ambulatory Visit: Payer: 59 | Admitting: Lab

## 2013-03-03 ENCOUNTER — Ambulatory Visit (HOSPITAL_BASED_OUTPATIENT_CLINIC_OR_DEPARTMENT_OTHER): Payer: 59

## 2013-03-03 ENCOUNTER — Other Ambulatory Visit (HOSPITAL_BASED_OUTPATIENT_CLINIC_OR_DEPARTMENT_OTHER): Payer: 59 | Admitting: Lab

## 2013-03-03 ENCOUNTER — Ambulatory Visit (HOSPITAL_BASED_OUTPATIENT_CLINIC_OR_DEPARTMENT_OTHER): Payer: 59 | Admitting: Physician Assistant

## 2013-03-03 ENCOUNTER — Encounter: Payer: Self-pay | Admitting: Physician Assistant

## 2013-03-03 ENCOUNTER — Telehealth: Payer: Self-pay | Admitting: Oncology

## 2013-03-03 VITALS — BP 99/65 | HR 87 | Temp 97.6°F | Resp 18 | Ht 63.0 in | Wt 122.3 lb

## 2013-03-03 DIAGNOSIS — C50319 Malignant neoplasm of lower-inner quadrant of unspecified female breast: Secondary | ICD-10-CM

## 2013-03-03 DIAGNOSIS — C50311 Malignant neoplasm of lower-inner quadrant of right female breast: Secondary | ICD-10-CM

## 2013-03-03 DIAGNOSIS — K5900 Constipation, unspecified: Secondary | ICD-10-CM

## 2013-03-03 DIAGNOSIS — Z5111 Encounter for antineoplastic chemotherapy: Secondary | ICD-10-CM

## 2013-03-03 DIAGNOSIS — D649 Anemia, unspecified: Secondary | ICD-10-CM

## 2013-03-03 DIAGNOSIS — C50911 Malignant neoplasm of unspecified site of right female breast: Secondary | ICD-10-CM

## 2013-03-03 DIAGNOSIS — Z170 Estrogen receptor positive status [ER+]: Secondary | ICD-10-CM

## 2013-03-03 DIAGNOSIS — B370 Candidal stomatitis: Secondary | ICD-10-CM

## 2013-03-03 LAB — CBC WITH DIFFERENTIAL/PLATELET
Basophils Absolute: 0 10*3/uL (ref 0.0–0.1)
Eosinophils Absolute: 0 10*3/uL (ref 0.0–0.5)
HCT: 32.2 % — ABNORMAL LOW (ref 34.8–46.6)
HGB: 10.7 g/dL — ABNORMAL LOW (ref 11.6–15.9)
MCV: 85.9 fL (ref 79.5–101.0)
MONO%: 5.7 % (ref 0.0–14.0)
NEUT#: 11.4 10*3/uL — ABNORMAL HIGH (ref 1.5–6.5)
NEUT%: 87.2 % — ABNORMAL HIGH (ref 38.4–76.8)
Platelets: 314 10*3/uL (ref 145–400)
RDW: 15.2 % — ABNORMAL HIGH (ref 11.2–14.5)

## 2013-03-03 MED ORDER — DEXAMETHASONE SODIUM PHOSPHATE 20 MG/5ML IJ SOLN
20.0000 mg | Freq: Once | INTRAMUSCULAR | Status: AC
  Administered 2013-03-03: 20 mg via INTRAVENOUS

## 2013-03-03 MED ORDER — DOCETAXEL CHEMO INJECTION 160 MG/16ML
75.0000 mg/m2 | Freq: Once | INTRAVENOUS | Status: AC
  Administered 2013-03-03: 120 mg via INTRAVENOUS
  Filled 2013-03-03: qty 12

## 2013-03-03 MED ORDER — SODIUM CHLORIDE 0.9 % IJ SOLN
10.0000 mL | INTRAMUSCULAR | Status: DC | PRN
  Administered 2013-03-03: 10 mL
  Filled 2013-03-03: qty 10

## 2013-03-03 MED ORDER — ONDANSETRON 16 MG/50ML IVPB (CHCC)
16.0000 mg | Freq: Once | INTRAVENOUS | Status: AC
  Administered 2013-03-03: 16 mg via INTRAVENOUS

## 2013-03-03 MED ORDER — ONDANSETRON 16 MG/50ML IVPB (CHCC)
INTRAVENOUS | Status: AC
  Filled 2013-03-03: qty 16

## 2013-03-03 MED ORDER — SODIUM CHLORIDE 0.9 % IV SOLN
600.0000 mg/m2 | Freq: Once | INTRAVENOUS | Status: AC
  Administered 2013-03-03: 940 mg via INTRAVENOUS
  Filled 2013-03-03: qty 47

## 2013-03-03 MED ORDER — DEXAMETHASONE SODIUM PHOSPHATE 20 MG/5ML IJ SOLN
INTRAMUSCULAR | Status: AC
  Filled 2013-03-03: qty 5

## 2013-03-03 MED ORDER — SODIUM CHLORIDE 0.9 % IV SOLN
Freq: Once | INTRAVENOUS | Status: AC
  Administered 2013-03-03: 11:00:00 via INTRAVENOUS

## 2013-03-03 MED ORDER — HEPARIN SOD (PORK) LOCK FLUSH 100 UNIT/ML IV SOLN
500.0000 [IU] | Freq: Once | INTRAVENOUS | Status: AC | PRN
  Administered 2013-03-03: 500 [IU]
  Filled 2013-03-03: qty 5

## 2013-03-03 NOTE — Addendum Note (Signed)
Encounter addended by: Tessa Lerner, RN on: 03/03/2013 12:07 PM<BR>     Documentation filed: Charges VN

## 2013-03-03 NOTE — Progress Notes (Signed)
ID: [name] OB: 09/20/1960  MR#: [id number]  JXB#:[id number]  PCP: Allean Found, MD GYN:   SUMikey Bussing Ingram]; Billy Fischer OTHER MD: Rogelia Mire, Lurline Hare, Garald Braver   HISTORY OF PRESENT ILLNESS: Destinae had a routine colonoscopy under Dr. Dulce Sellar approximately a year and a half ago, showing a right colon lesion which could not be removed intraoperatively. CT scans of the abdomen and pelvis 07/07/2011 were negative except for the question of a soft tissue lesion in the right colon, and particularly there was no adenopathy or evidence of metastatic disease. She underwent partial colectomy under Dr. Lodema Pilot 07/28/2011, and the pathology from that procedure ([id number]) showed a 3.2 cm tubular adenoma without high-grade dysplasia or malignancy. Margins were negative. 0 of 16 lymph nodes were involved.  And the patient has been working at weight loss and managed to lose approximately 25 pounds over the past year. She feels this is what allowed her to palpate a mass in her right breast earlier this month. (She had had negative mammographic screening 02/26/2012). On 10/27/2012 the patient underwent bilateral diagnostic mammography. The breasts are "extremely dense". Mammography did not show any new or worrisome finding. Right breast ultrasound however located an irregular hypoechoic mass in the area of the patient's palpable abnormality. It measured approximately 7 mm. Biopsy of this mass 10/28/2012 showed an invasive ductal carcinoma, grade 2, estrogen and progesterone receptor positive, with no HER-2 amplification, and an MIB-1 of 5%.  Bilateral breast MRI 11/01/2012 found a 1.3 cm irregular enhancing mass in the lower inner quadrant of the right breast, with no other masses of concern in either breast and no abnormal appearing lymph nodes.  The patient's subsequent history is as detailed below.   INTERVAL HISTORY: Arneda returns today  accompanied by her husband [name] for followup of her right breast cancer . She is currently due for her fourth and final q. three-week dose of docetaxel/cyclophosphamide given in the adjuvant setting.  She receives Neulasta on day 2 for granulocyte support.  Philana is feeling well today, and is happy to be completing her chemotherapy. She continues to feel somewhat tired, but has managed to work full-time this past week. She continues to have problems with constipation which she is managing with stool softeners and MiraLAX. Her reflux has improved. She's had no problems with nausea or emesis.   REVIEW OF SYSTEMS: Lizzy has had no fevers or chills. She's had no skin changes or rashes and denies any signs of abnormal bruising or bleeding.  She denies any cough, phlegm production, or chest pain. She has some mild shortness of breath with exertion which is stable. She's had no abnormal headaches or dizziness.  She currently denies any unusual myalgias, arthralgias, or bony pain. She's had no peripheral swelling, and she continues to deny any signs of peripheral neuropathy in either the upper or lower extremities.  A detailed review of systems is otherwise stable and noncontributory.    PAST MEDICAL HISTORY: Past Medical History  Diagnosis Date  . Asthma     as a child, early adult...none now  . Recurrent upper respiratory infection (URI)     started 1/28.....getting better  . GERD (gastroesophageal reflux disease)     occas  at  night....twice a  month--otc meds  . Depression   . Anxiety   . Colonic mass     ascending  . Breast cancer 10/28/12    right breast    PAST SURGICAL HISTORY: Past  Surgical History  Procedure Laterality Date  . Tubal ligation    . Hemicolectomy  07/28/11  . Breast biopsy Right 10/28/12    Invasive mammary ca, ER/PR=+, Her2 Neu-  . Appendectomy      FAMILY HISTORY Family History  Problem Relation Age of Onset  . Heart disease Father   . Cancer Paternal  Aunt     unknown form of cancer  . Lung cancer Maternal Grandmother   . Testicular cancer Cousin     paternal cousin; died in 60s  . Pancreatic cancer Cousin 40  . Colon cancer Other     paternal grandmother's sister  . Cancer Other     MGM's sister   the patient's father died from congestive far to failure at the age of 88. The patient's mother is still living, in her early 21s. The patient has one brother, 4 sisters. There is no history of breast or ovarian cancer in the family to the patient's knowledge.  GYNECOLOGIC HISTORY:  Menarche age 71, first live birth age 50. The patient's less. It was September 2013 and the one before that was March 2013. She is not taking hormone replacement. She did take oral contraceptives for several years intermittently in the past. There were no complications.   SOCIAL HISTORY:  Zulema is originally from [place]. 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 She is not a counselor herself, but her husband, Louellen Haldeman (goes by "[name]") works as a Veterinary surgeon for the [employer] as well as having his own clinic in [place]. He is originally from [place]. The patient's children are [name], who lives in [place] and has a Scientist, water quality in counseling; and [name], who is a Consulting civil engineer at [employer] He lives with the patient, and shares custody of his daughter, who is 23 years old and spends every other weekend at the patient's home.    ADVANCED DIRECTIVES: In place   HEALTH MAINTENANCE: History  Substance Use Topics  . Smoking status: Never Smoker   . Smokeless tobacco: Never Used  . Alcohol Use: Yes     Comment: wine occasionally     Colonoscopy: 06/02/2011 / [facility]  PAP:  Bone density:  Lipid panel:  No Known Allergies  Current Outpatient Prescriptions  Medication Sig Dispense Refill  . amphetamine-dextroamphetamine (ADDERALL XR) 15 MG 24 hr capsule Take 30 mg by mouth every  morning.       Marland Kitchen buPROPion (WELLBUTRIN XL) 150 MG 24 hr tablet Take 150 mg by mouth every morning.      . clindamycin-benzoyl peroxide (BENZACLIN) gel Apply 1 application topically at bedtime as needed (for face).       . clonazePAM (KLONOPIN) 0.5 MG tablet Take 0.5 mg by mouth 3 (three) times daily as needed for anxiety.       Marland Kitchen dexamethasone (DECADRON) 4 MG tablet Take 8 mg (2 x 4mg  tablets) by mouth at 8am & 5pm 1 day BEFORE chemo, 8am & 5pm on Day 2, 8am on Day 3, then as directed.      . fluconazole (DIFLUCAN) 100 MG tablet Take 1 tablet (100 mg total) by mouth daily.  7 tablet  1  . ibuprofen (ADVIL,MOTRIN) 200 MG tablet Take 200 mg by mouth every 6 (six) hours as needed for pain.      Marland Kitchen lidocaine-prilocaine (EMLA) cream Apply topically as needed. Apply over port site 2 hours before chemo; cover with saran wrap or similar  30 g  0  .  loratadine (CLARITIN) 10 MG tablet Take one tablet by mouth the morning of neulasta (day 2 chemo) and the next 3 days  30 tablet  2  . metoCLOPramide (REGLAN) 10 MG tablet Take 0.5 tablets (5 mg total) by mouth 4 (four) times daily.  90 tablet  6  . ondansetron (ZOFRAN) 8 MG tablet Take 1 tablet (8 mg total) by mouth every 12 (twelve) hours as needed for nausea.  20 tablet  3  . oxybutynin (DITROPAN-XL) 10 MG 24 hr tablet Take 10 mg by mouth daily.       . pantoprazole (PROTONIX) 40 MG tablet       . pegfilgrastim (NEULASTA) 6 MG/0.6ML injection Inject 6 mg into the skin once. Day after chemo treaments      . polyethylene glycol (MIRALAX / GLYCOLAX) packet Take 17 g by mouth daily as needed.      . ranitidine (ZANTAC) 150 MG tablet Take 150 mg by mouth daily with supper.      . senna-docusate (SENNA S) 8.6-50 MG per tablet Take 1 tablet by mouth daily.  50 tablet  6  . traZODone (DESYREL) 150 MG tablet Take 150 mg by mouth at bedtime.        No current facility-administered medications for this visit.   Facility-Administered Medications Ordered in Other Visits   Medication Dose Route Frequency Provider Last Rate Last Dose  . sodium chloride 0.9 % injection 10 mL  10 mL Intracatheter PRN Catalina Gravel, PA-C   10 mL at 03/03/13 1357    OBJECTIVE: Middle-aged [place] American woman who appears stated age and is in no acute distress Filed Vitals:   03/03/13 0937  BP: 99/65  Pulse: 87  Temp: 97.6 F (36.4 C)  Resp: 18     Body mass index is 21.67 kg/(m^2).    ECOG FS: 1 Filed Weights   03/03/13 0937  Weight: 122 lb 4.8 oz (55.475 kg)   HEENT:  Sclerae unicteric Oropharynx notable for white coating on the tongue and buccal mucosa, slightly improved since last exam, but still consistent with mild oropharyngeal candidiasis. No ulcerations noted. NODES: No cervical or supraclavicular adenopathy LUNGS:  clear to auscultation bilaterally, no wheezes, no rales or rhonchi HEART: regular rate and rhythm ABDOMEN: soft, nontender to palpation, positive bowel sounds.  MSK no focal spinal tenderness, no peripheral edema Neuro: nonfocal, well oriented, pleasant affect Breasts: Deferred.  Axillae are benign bilaterally, no palpable adenopathy. Port is intact in the upper chest wall with no erythema, edema, or evidence of infection.    LAB RESULTS:   Lab Results  Component Value Date   WBC 13.1* 03/03/2013   NEUTROABS 11.4* 03/03/2013   HGB 10.7* 03/03/2013   HCT 32.2* 03/03/2013   MCV 85.9 03/03/2013   PLT 314 03/03/2013      Chemistry      Component Value Date/Time   NA 138 02/17/2013 1329   NA 139 07/30/2011 0615   K 4.6 02/17/2013 1329   K 4.1 07/30/2011 0615   CL 102 11/03/2012 0821   CL 104 07/30/2011 0615   CO2 28 02/17/2013 1329   CO2 25 07/30/2011 0615   BUN 9.3 02/17/2013 1329   BUN 5* 07/30/2011 0615   CREATININE 0.7 02/17/2013 1329   CREATININE 0.68 07/30/2011 0615      Component Value Date/Time   CALCIUM 9.4 02/17/2013 1329   CALCIUM 8.8 07/30/2011 0615   ALKPHOS 64 02/17/2013 1329   ALKPHOS 56 07/22/2011 0845  AST 14 02/17/2013 1329   AST 14  07/22/2011 0845   ALT 15 02/17/2013 1329   ALT 14 07/22/2011 0845   BILITOT 0.36 02/17/2013 1329   BILITOT 0.3 07/22/2011 0845       STUDIES:  No results found.     ASSESSMENT: 52 y.o. [place] woman status post right breast lower inner quadrant biopsy 10/28/2012 for a clinical T1c N0, stage IA invasive ductal carcinoma, grade 2, estrogen and progesterone receptor positive, HER-2 not amplified, with an MIB-1 of 20%  (1) s/p Right lumpectomy and sentinel lymph node sampling 11/17/2012 for a pT2 pN0, stage IIA invasive ductal carcinoma with prominent lobular features, grade 2, with initially positive  margins cleared intraoperatively; estrogen receptor positive with an Allred score of 7, progesterone receptor positive with an Allred score of 8, HercepTest 0, proliferation index by ACIS III 32% ([id number])   (2) Oncotype DX score of 33 predicting a risk of distant recurrence within 10 years of 23% if her only systemic treatment is tamoxifen for 5 years; the estrogen receptor was interpreted as negative on the Oncotype report  (3) adjuvant cyclophosphamide and docetaxel started 12/30/2012, 4 cycles planned, with Neulasta support on day 2.  First cycle given at [facility], with the remaining cycles to be given  at [facility].  (4)  following chemotherapy, the patient will proceed to radiation therapy, followed by antiestrogen therapy.  (5)  Constipation, on stool softeners and Miralax  (6)  Oral candidiasis, starting Diflucan   PLAN:  Cynethia will complete her adjuvant chemotherapy today with her fourth and final cycle of docetaxel/cyclophosphamide. She will return tomorrow for her Neulasta injection.   She is leaving tomorrow evening to visit family in [place]. Since she has had no history of significant neuropathy since beginning chemotherapy, I am comfortable with delaying her followup visit by a few days. She will return to see Dr. Darnelle Catalan on September 30 for repeat labs and physical exam. I am sending  a written order for her to have a CBC with differential drawn if necessary while she is out of town, and I have included our fax number to have those results sent [place] if necessary. Of course she knows as always with any fevers of 100 or above she needs to seek medical care.   I again encouraged her to be very aggressive with her stool softeners and MiraLAX over the next few days to maintain regular bowel movements. She will also take another course of oral Diflucan for candidiasis, and she has a refill remaining on her current prescription.  Kateryn is already scheduled to initiate radiation therapy in early October. She will see Dr. Darnelle Catalan in November to discuss antiestrogen therapy. She and her husband both voice understanding and agreement with this plan, and will call with any changes or problems prior to her next scheduled appointment.   Rayson Rando, PA-C   03/03/2013 3:30 PM

## 2013-03-03 NOTE — Patient Instructions (Addendum)
[facility] Discharge Instructions for Patients Receiving Chemotherapy  Today you received the following chemotherapy agents Taxotere,Cytoxan To help prevent nausea and vomiting after your treatment, we encourage you to take your nausea medication as prescribed.  If you develop nausea and vomiting that is not controlled by your nausea medication, call the clinic.   BELOW ARE SYMPTOMS THAT SHOULD BE REPORTED IMMEDIATELY:  *FEVER GREATER THAN 100.5 F  *CHILLS WITH OR WITHOUT FEVER  NAUSEA AND VOMITING THAT IS NOT CONTROLLED WITH YOUR NAUSEA MEDICATION  *UNUSUAL SHORTNESS OF BREATH  *UNUSUAL BRUISING OR BLEEDING  TENDERNESS IN MOUTH AND THROAT WITH OR WITHOUT PRESENCE OF ULCERS  *URINARY PROBLEMS  *BOWEL PROBLEMS  UNUSUAL RASH Items with * indicate a potential emergency and should be followed up as soon as possible.  Feel free to call the clinic you have any questions or concerns. The clinic phone number is [phone].    

## 2013-03-04 ENCOUNTER — Ambulatory Visit (HOSPITAL_BASED_OUTPATIENT_CLINIC_OR_DEPARTMENT_OTHER): Payer: 59

## 2013-03-04 VITALS — BP 110/46 | HR 75 | Temp 98.2°F

## 2013-03-04 DIAGNOSIS — C50311 Malignant neoplasm of lower-inner quadrant of right female breast: Secondary | ICD-10-CM

## 2013-03-04 DIAGNOSIS — C50319 Malignant neoplasm of lower-inner quadrant of unspecified female breast: Secondary | ICD-10-CM

## 2013-03-04 DIAGNOSIS — C50911 Malignant neoplasm of unspecified site of right female breast: Secondary | ICD-10-CM

## 2013-03-04 DIAGNOSIS — Z5189 Encounter for other specified aftercare: Secondary | ICD-10-CM

## 2013-03-04 MED ORDER — PEGFILGRASTIM INJECTION 6 MG/0.6ML
6.0000 mg | Freq: Once | SUBCUTANEOUS | Status: AC
  Administered 2013-03-04: 6 mg via SUBCUTANEOUS
  Filled 2013-03-04: qty 0.6

## 2013-03-10 ENCOUNTER — Ambulatory Visit: Payer: 59 | Admitting: Oncology

## 2013-03-10 ENCOUNTER — Other Ambulatory Visit: Payer: 59 | Admitting: Lab

## 2013-03-11 NOTE — Addendum Note (Signed)
Encounter addended by: Delynn Flavin, RN on: 03/11/2013  9:18 AM<BR>     Documentation filed: Charges VN

## 2013-03-15 ENCOUNTER — Telehealth: Payer: Self-pay | Admitting: *Deleted

## 2013-03-15 ENCOUNTER — Ambulatory Visit (HOSPITAL_BASED_OUTPATIENT_CLINIC_OR_DEPARTMENT_OTHER): Payer: 59 | Admitting: Oncology

## 2013-03-15 ENCOUNTER — Other Ambulatory Visit (HOSPITAL_BASED_OUTPATIENT_CLINIC_OR_DEPARTMENT_OTHER): Payer: 59 | Admitting: Lab

## 2013-03-15 VITALS — BP 100/69 | HR 91 | Temp 98.2°F | Resp 20 | Ht 63.0 in | Wt 124.3 lb

## 2013-03-15 DIAGNOSIS — C50319 Malignant neoplasm of lower-inner quadrant of unspecified female breast: Secondary | ICD-10-CM

## 2013-03-15 DIAGNOSIS — Z170 Estrogen receptor positive status [ER+]: Secondary | ICD-10-CM

## 2013-03-15 DIAGNOSIS — C50311 Malignant neoplasm of lower-inner quadrant of right female breast: Secondary | ICD-10-CM

## 2013-03-15 LAB — CBC WITH DIFFERENTIAL/PLATELET
BASO%: 0.5 % (ref 0.0–2.0)
Eosinophils Absolute: 0.2 10*3/uL (ref 0.0–0.5)
HCT: 31.3 % — ABNORMAL LOW (ref 34.8–46.6)
HGB: 10.4 g/dL — ABNORMAL LOW (ref 11.6–15.9)
MCHC: 33.2 g/dL (ref 31.5–36.0)
MONO#: 1.2 10*3/uL — ABNORMAL HIGH (ref 0.1–0.9)
NEUT#: 10.8 10*3/uL — ABNORMAL HIGH (ref 1.5–6.5)
NEUT%: 76 % (ref 38.4–76.8)
WBC: 14.2 10*3/uL — ABNORMAL HIGH (ref 3.9–10.3)
lymph#: 1.9 10*3/uL (ref 0.9–3.3)

## 2013-03-15 NOTE — Progress Notes (Signed)
ID: [name] OB: [birth year]  MR#: [id number]  CSN#:[id number]  PCP: Allean Found, MD GYN:   SUMikey Bussing Ingram]; Billy Fischer OTHER MD: Rogelia Mire, Lurline Hare, Garald Braver   HISTORY OF PRESENT ILLNESS: [name] had a routine colonoscopy under Dr. Dulce Sellar approximately a year and a half ago, showing a right colon lesion which could not be removed intraoperatively. CT scans of the abdomen and pelvis 07/07/2011 were negative except for the question of a soft tissue lesion in the right colon, and particularly there was no adenopathy or evidence of metastatic disease. She underwent partial colectomy under Dr. Lodema Pilot 07/28/2011, and the pathology from that procedure ([id number]) showed a 3.2 cm tubular adenoma without high-grade dysplasia or malignancy. Margins were negative. 0 of 16 lymph nodes were involved.  And the patient has been working at weight loss and managed to lose approximately 25 pounds over the past year. She feels this is what allowed her to palpate a mass in her right breast earlier this month. (She had had negative mammographic screening 02/26/2012). On 10/27/2012 the patient underwent bilateral diagnostic mammography. The breasts are "extremely dense". Mammography did not show any new or worrisome finding. Right breast ultrasound however located an irregular hypoechoic mass in the area of the patient's palpable abnormality. It measured approximately 7 mm. Biopsy of this mass 10/28/2012 showed an invasive ductal carcinoma, grade 2, estrogen and progesterone receptor positive, with no HER-2 amplification, and an MIB-1 of 5%.  Bilateral breast MRI 11/01/2012 found a 1.3 cm irregular enhancing mass in the lower inner quadrant of the right breast, with no other masses of concern in either breast and no abnormal appearing lymph nodes.  The patient's subsequent history is as detailed below.   INTERVAL HISTORY: [name] returns today  accompanied by her husband [name] for followup of her right breast cancer . She is currently day 13 cycle 4 of 4 planned cycles of docetaxel/cyclophosphamide given in the adjuvant setting with Neulasta support. She has now completed her chemotherapy treatments and is ready to start her radiation  REVIEW OF SYSTEMS: [name] did remarkably well with her treatments. The final cycle was easier than the third, for reasons that are not clear, but may have something to do with her having gone to [place] for a few days and spend time with family, decreasing her usual work stress. She did have a sensation of "pins and needles all over" with the final cycle, but this happened only for one day and it has completely resolved. She is having trouble sleeping, falls asleep easily but wakes up secondary to hot flashes and some x2 nocturia. He describes herself as mildly fatigued. Her vision is blurred but she is able to continue to work fairly normally. She had thrush which has been treated with Diflucan and clinically has resolved. She has noted a small irregularity under the incision in the right breast and she wanted me to check on that today. Her sense of balance she feels is not as good as it used to be and the third toe of both feet is somewhat. Otherwise a detailed review of systems today was noncontributory.  PAST MEDICAL HISTORY: Past Medical History  Diagnosis Date  . Asthma     as a child, early adult...none now  . Recurrent upper respiratory infection (URI)     started 1/28.....getting better  . GERD (gastroesophageal reflux disease)     occas  at  night....twice a  month--otc meds  . Depression   .  Anxiety   . Colonic mass     ascending  . Breast cancer 10/28/12    right breast    PAST SURGICAL HISTORY: Past Surgical History  Procedure Laterality Date  . Tubal ligation    . Hemicolectomy  07/28/11  . Breast biopsy Right 10/28/12    Invasive mammary ca, ER/PR=+, Her2 Neu-  . Appendectomy       FAMILY HISTORY Family History  Problem Relation Age of Onset  . Heart disease Father   . Cancer Paternal Aunt     unknown form of cancer  . Lung cancer Maternal Grandmother   . Testicular cancer Cousin     paternal cousin; died in 78s  . Pancreatic cancer Cousin 40  . Colon cancer Other     paternal grandmother's sister  . Cancer Other     MGM's sister   the patient's father died from congestive far to failure at the age of 93. The patient's mother is still living, in her early 69s. The patient has one brother, 4 sisters. There is no history of breast or ovarian cancer in the family to the patient's knowledge.  GYNECOLOGIC HISTORY:  Menarche age 62, first live birth age 89. The patient's less. It was September 2013 and the one before that was March 2013. She is not taking hormone replacement. She did take oral contraceptives for several years intermittently in the past. There were no complications.   SOCIAL HISTORY:  [name] is originally from [place]. 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 She is not a counselor herself, but her husband, [name] (goes by "[name]") works as a Veterinary surgeon for the [employer] as well as having his own clinic in [place]. He is originally from [place]. The patient's children are [name], who lives in [place] and has a Scientist, water quality in counseling; and [name], who is a Consulting civil engineer at [employer] He lives with the patient, and shares custody of his daughter, who is 59 years old and spends every other weekend at the patient's home.    ADVANCED DIRECTIVES: In place   HEALTH MAINTENANCE: History  Substance Use Topics  . Smoking status: Never Smoker   . Smokeless tobacco: Never Used  . Alcohol Use: Yes     Comment: wine occasionally     Colonoscopy: 06/02/2011 / [facility]  PAP:  Bone density:  Lipid panel:  No Known Allergies  Current Outpatient Prescriptions  Medication Sig  Dispense Refill  . amphetamine-dextroamphetamine (ADDERALL XR) 15 MG 24 hr capsule Take 30 mg by mouth every morning.       Marland Kitchen buPROPion (WELLBUTRIN XL) 150 MG 24 hr tablet Take 150 mg by mouth every morning.      . clindamycin-benzoyl peroxide (BENZACLIN) gel Apply 1 application topically at bedtime as needed (for face).       . clonazePAM (KLONOPIN) 0.5 MG tablet Take 0.5 mg by mouth 3 (three) times daily as needed for anxiety.       Marland Kitchen dexamethasone (DECADRON) 4 MG tablet Take 8 mg (2 x 4mg  tablets) by mouth at 8am & 5pm 1 day BEFORE chemo, 8am & 5pm on Day 2, 8am on Day 3, then as directed.      . fluconazole (DIFLUCAN) 100 MG tablet Take 1 tablet (100 mg total) by mouth daily.  7 tablet  1  . ibuprofen (ADVIL,MOTRIN) 200 MG tablet Take 200 mg by mouth every 6 (six) hours as needed for pain.      Marland Kitchen  lidocaine-prilocaine (EMLA) cream Apply topically as needed. Apply over port site 2 hours before chemo; cover with saran wrap or similar  30 g  0  . loratadine (CLARITIN) 10 MG tablet Take one tablet by mouth the morning of neulasta (day 2 chemo) and the next 3 days  30 tablet  2  . metoCLOPramide (REGLAN) 10 MG tablet Take 0.5 tablets (5 mg total) by mouth 4 (four) times daily.  90 tablet  6  . ondansetron (ZOFRAN) 8 MG tablet Take 1 tablet (8 mg total) by mouth every 12 (twelve) hours as needed for nausea.  20 tablet  3  . oxybutynin (DITROPAN-XL) 10 MG 24 hr tablet Take 10 mg by mouth daily.       . pantoprazole (PROTONIX) 40 MG tablet       . pegfilgrastim (NEULASTA) 6 MG/0.6ML injection Inject 6 mg into the skin once. Day after chemo treaments      . polyethylene glycol (MIRALAX / GLYCOLAX) packet Take 17 g by mouth daily as needed.      . ranitidine (ZANTAC) 150 MG tablet Take 150 mg by mouth daily with supper.      . senna-docusate (SENNA S) 8.6-50 MG per tablet Take 1 tablet by mouth daily.  50 tablet  6  . traZODone (DESYREL) 150 MG tablet Take 150 mg by mouth at bedtime.        No current  facility-administered medications for this visit.    OBJECTIVE: Middle-aged Latin American woman in no acute distress   Filed Vitals:   03/15/13 1537  BP: 100/69  Pulse: 91  Temp: 98.2 F (36.8 C)  Resp: 20     Body mass index is 22.02 kg/(m^2).    ECOG FS: 1 Filed Weights   03/15/13 1537  Weight: 124 lb 4.8 oz (56.382 kg)   Sclerae unicteric, pupils equal round and reactive Oropharynx shows no thrush or ulcerations No cervical or supraclavicular adenopathy Lungs clear to auscultation, good excursion bilaterally Heart regular rate and rhythm Abdomen soft, nontender, positive bowel sounds.  MSK no focal spinal tenderness, no peripheral edema Neuro: nonfocal, well oriented, pleasant affect Breasts: The right breast is status post lumpectomy. Just below the incision there is a slight irregularity which is soft, not hard, and feels like fatty tissue. The rest of the breast shows no skin or nipple changes of concern. The right axilla is benign. The left breast is unremarkable    LAB RESULTS:   Lab Results  Component Value Date   WBC 14.2* 03/15/2013   NEUTROABS 10.8* 03/15/2013   HGB 10.4* 03/15/2013   HCT 31.3* 03/15/2013   MCV 87.2 03/15/2013   PLT 272 03/15/2013      Chemistry      Component Value Date/Time   NA 138 02/17/2013 1329   NA 139 07/30/2011 0615   K 4.6 02/17/2013 1329   K 4.1 07/30/2011 0615   CL 102 11/03/2012 0821   CL 104 07/30/2011 0615   CO2 28 02/17/2013 1329   CO2 25 07/30/2011 0615   BUN 9.3 02/17/2013 1329   BUN 5* 07/30/2011 0615   CREATININE 0.7 02/17/2013 1329   CREATININE 0.68 07/30/2011 0615      Component Value Date/Time   CALCIUM 9.4 02/17/2013 1329   CALCIUM 8.8 07/30/2011 0615   ALKPHOS 64 02/17/2013 1329   ALKPHOS 56 07/22/2011 0845   AST 14 02/17/2013 1329   AST 14 07/22/2011 0845   ALT 15 02/17/2013 1329   ALT 14  07/22/2011 0845   BILITOT 0.36 02/17/2013 1329   BILITOT 0.3 07/22/2011 0845       STUDIES:  No results found.  ASSESSMENT: 52 y.o.  [place] woman status post right breast lower inner quadrant biopsy 10/28/2012 for a clinical T1c N0, stage IA invasive ductal carcinoma, grade 2, estrogen and progesterone receptor positive, HER-2 not amplified, with an MIB-1 of 20%  (1) s/p Right lumpectomy and sentinel lymph node sampling 11/17/2012 for a pT2 pN0, stage IIA invasive ductal carcinoma with prominent lobular features, grade 2, with initially positive  margins cleared intraoperatively; estrogen receptor positive with an Allred score of 7, progesterone receptor positive with an Allred score of 8, HercepTest 0, proliferation index by ACIS III 32% ([id number])   (2) Oncotype DX score of 33 predicting a risk of distant recurrence within 10 years of 23% if her only systemic treatment is tamoxifen for 5 years; the estrogen receptor was interpreted as negative on the Oncotype report  (3) adjuvant cyclophosphamide and docetaxel started 12/30/2012, 4 cycles planned, with Neulasta support on day 2.  First cycle given at [facility], with the remaining cycles given  at [facility] and completed 03/03/2013  (4)  at this point, the patient will proceed to radiation therapy, followed by antiestrogen therapy.   PLAN:  [place]  did remarkably well with her chemotherapy. She will see Dr. Michell Heinrich next week and is likely to be receiving radiation therapy through November. Accordingly she will see me again in early December, and at that point we will discuss antiestrogen therapy, which in her case likely will start with tamoxifen, as she has significant concerns regarding vaginal dryness issues in particular.  Lowella Dell, MD   03/15/2013 4:19 PM

## 2013-03-15 NOTE — Progress Notes (Signed)
This encounter was created in error - please disregard.

## 2013-03-15 NOTE — Telephone Encounter (Signed)
appts made and printed. gv appt for [facility] 05/10/13@ 9:30am. ....td

## 2013-03-16 ENCOUNTER — Telehealth: Payer: Self-pay | Admitting: *Deleted

## 2013-03-16 NOTE — Telephone Encounter (Signed)
sw pt informed her that her appts for 11/3, 11/0, and 11/25 are canceled. gv appt for labs on 05/17/13 @ 4pm and ov to follow. pt already have appt for [facility].Marland Kitchentd

## 2013-03-18 ENCOUNTER — Encounter (HOSPITAL_COMMUNITY): Payer: Self-pay | Admitting: Emergency Medicine

## 2013-03-18 ENCOUNTER — Telehealth: Payer: Self-pay | Admitting: *Deleted

## 2013-03-18 ENCOUNTER — Other Ambulatory Visit: Payer: Self-pay | Admitting: *Deleted

## 2013-03-18 ENCOUNTER — Emergency Department (INDEPENDENT_AMBULATORY_CARE_PROVIDER_SITE_OTHER)
Admission: EM | Admit: 2013-03-18 | Discharge: 2013-03-18 | Disposition: A | Discharge status: Home / Self Care | Payer: 59 | Source: Home / Self Care | Attending: Emergency Medicine | Admitting: Emergency Medicine

## 2013-03-18 DIAGNOSIS — N3000 Acute cystitis without hematuria: Secondary | ICD-10-CM

## 2013-03-18 LAB — POCT URINALYSIS DIP (DEVICE)
Bilirubin Urine: NEGATIVE
Glucose, UA: NEGATIVE mg/dL
Ketones, ur: NEGATIVE mg/dL
Leukocytes, UA: NEGATIVE
Specific Gravity, Urine: 1.015 (ref 1.005–1.030)
Urobilinogen, UA: 0.2 mg/dL (ref 0.0–1.0)
pH: 7 (ref 5.0–8.0)

## 2013-03-18 MED ORDER — PHENAZOPYRIDINE HCL 200 MG PO TABS: 200.0000 mg | ORAL_TABLET | Freq: Three times a day (TID) | ORAL | Status: DC | PRN

## 2013-03-18 MED ORDER — CEPHALEXIN 500 MG PO CAPS: 500.0000 mg | ORAL_CAPSULE | Freq: Three times a day (TID) | ORAL | Status: DC

## 2013-03-18 NOTE — ED Notes (Signed)
Patient completed chemo and is to start radiation next week

## 2013-03-18 NOTE — ED Provider Notes (Signed)
Chief Complaint:   Chief Complaint  Patient presents with  . Urinary Tract Infection    History of Present Illness:   [name] is a 52 year old female with a history of breast cancer who recently finished up her course of chemotherapy. She has had a two-day history of dysuria, burning, frequency, urgency, and urinary order. She denies any hematuria. She's had some suprapubic pain but denies any lower back pain, nausea, or vomiting. She has not had a fever or chills. She has had a urinary tract infection in the remote past.  Review of Systems:  Other than noted above, the patient denies any of the following symptoms: General:  No fevers, chills, sweats, aches, or fatigue. GI:  No abdominal pain, back pain, nausea, vomiting, diarrhea, or constipation. GU:  No dysuria, frequency, urgency, hematuria, or incontinence. GYN:  No discharge, itching, vulvar pain or lesions, pelvic pain, or abnormal vaginal bleeding.  PMFSH:  Past medical history, family history, social history, meds, and allergies were reviewed.  She has no medication allergies. She takes Adderall, Wellbutrin, BenzaClin, Klonopin, Decadron, Diflucan, ibuprofen, metoclopramide, Zofran, Ditropan, Protonix, Neulasta, MiraLax, Zantac, senna, and Desyrel. She has a history of breast cancer and underwent a right mastectomy. She finished chemotherapy 2 weeks ago and will go on to radiation therapy. She's has also been treated for depression and she's had a hemicolectomy for large adenoma.  Physical Exam:   Vital signs:  BP 111/74  Pulse 82  Temp(Src) 99 F (37.2 C) (Oral)  Resp 20  SpO2 97% Gen:  Alert, oriented, in no distress. Lungs:  Clear to auscultation, no wheezes, rales or rhonchi. Heart:  Regular rhythm, no gallop or murmer. Abdomen:  Flat and soft. There was slight suprapubic pain to palpation.  No guarding, or rebound.  No hepato-splenomegaly or mass.  Bowel sounds were normally active.  No hernia. Back:  No CVA  tenderness.  Skin:  Clear, warm and dry.  Labs:    Results for orders placed during the hospital encounter of 03/18/13  POCT URINALYSIS DIP (DEVICE)      Result Value Range   Glucose, UA NEGATIVE  NEGATIVE mg/dL   Bilirubin Urine NEGATIVE  NEGATIVE   Ketones, ur NEGATIVE  NEGATIVE mg/dL   Specific Gravity, Urine 1.015  1.005 - 1.030   Hgb urine dipstick TRACE (*) NEGATIVE   pH 7.0  5.0 - 8.0   Protein, ur NEGATIVE  NEGATIVE mg/dL   Urobilinogen, UA 0.2  0.0 - 1.0 mg/dL   Nitrite POSITIVE (*) NEGATIVE   Leukocytes, UA NEGATIVE  NEGATIVE     A urine culture was obtained.  Results are pending at this time and we will call about any positive results.  Assessment: The encounter diagnosis was Acute cystitis.   No evidence of acute pyelonephritis.  Plan:   1.  Meds:  The following meds were prescribed:   Discharge Medication List as of 03/18/2013  5:47 PM    START taking these medications   Details  cephALEXin (KEFLEX) 500 MG capsule Take 1 capsule (500 mg total) by mouth 3 (three) times daily., Starting 03/18/2013, Until Discontinued, Normal    phenazopyridine (PYRIDIUM) 200 MG tablet Take 1 tablet (200 mg total) by mouth 3 (three) times daily as needed for pain., Starting 03/18/2013, Until Discontinued, Normal        2.  Patient Education/Counseling:  The patient was given appropriate handouts, self care instructions, and instructed in symptomatic relief. The patient was told to avoid intercourse for  10 days, get extra fluids, and return for a follow up with her primary care doctor at the completion of treatment for a repeat UA and culture.    3.  Follow up:  The patient was told to follow up if no better in 3 to 4 days, if becoming worse in any way, and given some red flag symptoms such as fever, persistent vomiting, or increasing pain which would prompt immediate return.  Follow up here or at the emergency department as needed.     Reuben Likes, MD 03/18/13 [phone]

## 2013-03-18 NOTE — ED Notes (Signed)
Reports history of uti, patient reports feeling pain with urination.

## 2013-03-18 NOTE — Telephone Encounter (Signed)
Pt called with c/o burning and pain upon urination.  No blood noticed.  Encourage pt to go to urgent care pt get UA and culture.

## 2013-03-20 LAB — URINE CULTURE

## 2013-03-21 ENCOUNTER — Encounter: Payer: Self-pay | Admitting: Physician Assistant

## 2013-03-22 ENCOUNTER — Telehealth: Payer: Self-pay | Admitting: *Deleted

## 2013-03-22 ENCOUNTER — Ambulatory Visit
Admission: RE | Admit: 2013-03-22 | Discharge: 2013-03-22 | Disposition: A | Discharge status: Home / Self Care | Payer: 59 | Source: Ambulatory Visit | Attending: Radiation Oncology | Admitting: Radiation Oncology

## 2013-03-22 DIAGNOSIS — Y842 Radiological procedure and radiotherapy as the cause of abnormal reaction of the patient, or of later complication, without mention of misadventure at the time of the procedure: Secondary | ICD-10-CM | POA: Insufficient documentation

## 2013-03-22 DIAGNOSIS — C50919 Malignant neoplasm of unspecified site of unspecified female breast: Secondary | ICD-10-CM | POA: Insufficient documentation

## 2013-03-22 DIAGNOSIS — L589 Radiodermatitis, unspecified: Secondary | ICD-10-CM | POA: Insufficient documentation

## 2013-03-22 DIAGNOSIS — C50311 Malignant neoplasm of lower-inner quadrant of right female breast: Secondary | ICD-10-CM

## 2013-03-22 DIAGNOSIS — M25519 Pain in unspecified shoulder: Secondary | ICD-10-CM | POA: Insufficient documentation

## 2013-03-22 DIAGNOSIS — Z510 Encounter for antineoplastic radiation therapy: Secondary | ICD-10-CM | POA: Insufficient documentation

## 2013-03-22 DIAGNOSIS — R5381 Other malaise: Secondary | ICD-10-CM | POA: Insufficient documentation

## 2013-03-22 DIAGNOSIS — M79609 Pain in unspecified limb: Secondary | ICD-10-CM | POA: Insufficient documentation

## 2013-03-22 DIAGNOSIS — L989 Disorder of the skin and subcutaneous tissue, unspecified: Secondary | ICD-10-CM | POA: Insufficient documentation

## 2013-03-22 NOTE — ED Notes (Signed)
Urine culture: Group B strep (S. Agalactiae).  Dr. Lorenz Coaster confirmed that pt. adequately treated with Keflex. [name] 03/22/2013

## 2013-03-22 NOTE — Progress Notes (Signed)
Name: [name]   MRN: [id number]  Date:  03/22/2013  DOB: 1960-11-08  Status:outpatient    DIAGNOSIS: Breast cancer.  CONSENT VERIFIED: yes   SET UP: Patient is setup supine   IMMOBILIZATION:  The following immobilization was used:Custom Moldable Pillow, breast board.   NARRATIVE: [name] was brought to the CT Simulation planning suite.  Identity was confirmed.  All relevant records and images related to the planned course of therapy were reviewed.  Then, the patient was positioned in a stable reproducible clinical set-up for radiation therapy.  Wires were placed to delineate the clinical extent of breast tissue. A wire was placed on the scar as well.  CT images were obtained.  An isocenter was placed. Skin markings were placed.  The CT images were loaded into the planning software where the target and avoidance structures were contoured.  The radiation prescription was entered and confirmed. The patient was discharged in stable condition and tolerated simulation well.    TREATMENT PLANNING NOTE:  Treatment planning then occurred. I have requested : MLC's, isodose plan, basic dose calculation  I personally designed and supervised the construction of 3 medically necessary complex treatment devices for the protection of critical normal structures including the lungs and contralateral breast as well as the immobilization device which is necessary for set up certainty.

## 2013-03-22 NOTE — Telephone Encounter (Signed)
Verbal order received and read back from Ms. Lanae Crumbly for patient to wait until she completes antibiotic and until RT is completed before receiving the flu vaccine.  Called Kayleigh with these orders.  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        Reports "not being able to work as well, not looking for an excuse but would like to know why her brain isn't leaving able to do the same things at work.  It feels like jet lag that won't go away."  Works as a Emergency planning/management officer.  Last Taxotere/Cytoxan was received 03-03-2013.

## 2013-03-22 NOTE — Telephone Encounter (Signed)
Discussed "Chemo Brain" phenomenon with [facility] staff/A.P.P.  Notified patient this can last up to a year with some people having residual after the year.  Instructed her to try free brain game APPs like "Luminosity" to exercise her brain and memory to challenge her brain and keep it sharp.  No further questions and she thanked me for this information.

## 2013-03-24 ENCOUNTER — Ambulatory Visit: Payer: 59

## 2013-03-29 ENCOUNTER — Ambulatory Visit
Admission: RE | Admit: 2013-03-29 | Discharge: 2013-03-29 | Disposition: A | Discharge status: Home / Self Care | Payer: 59 | Source: Ambulatory Visit | Attending: Radiation Oncology | Admitting: Radiation Oncology

## 2013-03-29 DIAGNOSIS — C50311 Malignant neoplasm of lower-inner quadrant of right female breast: Secondary | ICD-10-CM

## 2013-03-29 NOTE — Progress Notes (Signed)
  Radiation Oncology         [phone] ________________________________  Name: [name] MRN: [id number]  Date: 03/29/2013  DOB: [birth year]  Simulation Verification Note  Status: outpatient  NARRATIVE: The patient was brought to the treatment unit and placed in the planned treatment position. The clinical setup was verified. Then port films were obtained and uploaded to the radiation oncology medical record software.  The treatment beams were carefully compared against the planned radiation fields. The position location and shape of the radiation fields was reviewed. The targeted volume of tissue appears appropriately covered by the radiation beams. Organs at risk appear to be excluded as planned.  Based on my personal review, I approved the simulation verification. The patient's treatment will proceed as planned.  ------------------------------------------------  Lurline Hare, MD

## 2013-03-30 ENCOUNTER — Ambulatory Visit
Admission: RE | Admit: 2013-03-30 | Discharge: 2013-03-30 | Disposition: A | Discharge status: Home / Self Care | Payer: 59 | Source: Ambulatory Visit | Attending: Radiation Oncology | Admitting: Radiation Oncology

## 2013-03-30 DIAGNOSIS — C50311 Malignant neoplasm of lower-inner quadrant of right female breast: Secondary | ICD-10-CM

## 2013-03-30 MED ORDER — ALRA NON-METALLIC DEODORANT (RAD-ONC)
1.0000 "application " | Freq: Once | TOPICAL | Status: AC
  Administered 2013-03-30: 1 via TOPICAL

## 2013-03-30 MED ORDER — RADIAPLEXRX EX GEL
Freq: Once | CUTANEOUS | Status: AC
  Administered 2013-03-30: 12:00:00 via TOPICAL

## 2013-03-30 NOTE — Progress Notes (Signed)
Routine of clinic reviewed with patient as well as possible side effects of radiation treatment to include skin changes, fatigue, and pain.Given  alra deodorant, radiaplex and Radiation Therapy and You Booklet.Knows not to wear underwire bra during treatment schedule.Informed to let us take care of needs/concerns as patient here daily Monday thru Friday.

## 2013-03-31 ENCOUNTER — Ambulatory Visit
Admission: RE | Admit: 2013-03-31 | Discharge: 2013-03-31 | Disposition: A | Discharge status: Home / Self Care | Payer: 59 | Source: Ambulatory Visit | Attending: Radiation Oncology | Admitting: Radiation Oncology

## 2013-04-01 ENCOUNTER — Ambulatory Visit
Admission: RE | Admit: 2013-04-01 | Discharge: 2013-04-01 | Disposition: A | Discharge status: Home / Self Care | Payer: 59 | Source: Ambulatory Visit | Attending: Radiation Oncology | Admitting: Radiation Oncology

## 2013-04-04 ENCOUNTER — Ambulatory Visit
Admission: RE | Admit: 2013-04-04 | Discharge: 2013-04-04 | Disposition: A | Discharge status: Home / Self Care | Payer: 59 | Source: Ambulatory Visit | Attending: Radiation Oncology | Admitting: Radiation Oncology

## 2013-04-05 ENCOUNTER — Ambulatory Visit
Admission: RE | Admit: 2013-04-05 | Discharge: 2013-04-05 | Disposition: A | Discharge status: Home / Self Care | Payer: 59 | Source: Ambulatory Visit | Attending: Radiation Oncology | Admitting: Radiation Oncology

## 2013-04-05 ENCOUNTER — Encounter: Payer: Self-pay | Admitting: Dietician

## 2013-04-05 VITALS — BP 110/49 | HR 79 | Temp 98.3°F | Wt 122.0 lb

## 2013-04-05 DIAGNOSIS — C50311 Malignant neoplasm of lower-inner quadrant of right female breast: Secondary | ICD-10-CM

## 2013-04-05 NOTE — Progress Notes (Signed)
Breast Cancer Nutrition Class Attendance Note  Date: 04/05/2013  Time: 1800  Pt attended [facility]'s Breast Cancer Nutrition Class, "Food For Your Fight". Pt was educated on basic cancer nutrition principles, including plant based diet and principles from AICR  (American Institute for Cancer Research) about latest nutrition findings and recommendations. Questions answered. Handouts and recipes provided.   [name], RD, LDN Pager: [phone]  

## 2013-04-05 NOTE — Progress Notes (Signed)
Weekly Management Note Current Dose:  10 Gy  Projected Dose: 50 Gy   Narrative:  The patient presents for routine under treatment assessment.  CBCT/MVCT images/Port film x-rays were reviewed.  The chart was checked. Doing well. Some fatigue. Still working but work "has been very understanding".   Physical Findings: Weight: 122 lb (55.339 kg). Unchanged  Impression:  The patient is tolerating radiation.  Plan:  Continue treatment as planned. Continue radiaplex.

## 2013-04-05 NOTE — Progress Notes (Signed)
Patient for weekly assessment of radiation to right breast.Completed 5 of 25.No skin changes or concerns today.Continue application of radiaplex.

## 2013-04-06 ENCOUNTER — Ambulatory Visit
Admission: RE | Admit: 2013-04-06 | Discharge: 2013-04-06 | Disposition: A | Discharge status: Home / Self Care | Payer: 59 | Source: Ambulatory Visit | Attending: Radiation Oncology | Admitting: Radiation Oncology

## 2013-04-07 ENCOUNTER — Ambulatory Visit
Admission: RE | Admit: 2013-04-07 | Discharge: 2013-04-07 | Disposition: A | Discharge status: Home / Self Care | Payer: 59 | Source: Ambulatory Visit | Attending: Radiation Oncology | Admitting: Radiation Oncology

## 2013-04-08 ENCOUNTER — Ambulatory Visit
Admission: RE | Admit: 2013-04-08 | Discharge: 2013-04-08 | Disposition: A | Discharge status: Home / Self Care | Payer: 59 | Source: Ambulatory Visit | Attending: Radiation Oncology | Admitting: Radiation Oncology

## 2013-04-11 ENCOUNTER — Ambulatory Visit
Admission: RE | Admit: 2013-04-11 | Discharge: 2013-04-11 | Disposition: A | Discharge status: Home / Self Care | Payer: 59 | Source: Ambulatory Visit | Attending: Radiation Oncology | Admitting: Radiation Oncology

## 2013-04-12 ENCOUNTER — Ambulatory Visit
Admission: RE | Admit: 2013-04-12 | Discharge: 2013-04-12 | Disposition: A | Discharge status: Home / Self Care | Payer: 59 | Source: Ambulatory Visit | Attending: Radiation Oncology | Admitting: Radiation Oncology

## 2013-04-12 VITALS — BP 100/50 | HR 85 | Temp 98.6°F | Wt 122.0 lb

## 2013-04-12 DIAGNOSIS — C50311 Malignant neoplasm of lower-inner quadrant of right female breast: Secondary | ICD-10-CM

## 2013-04-12 NOTE — Progress Notes (Signed)
Weekly Management Note Current Dose: 20 Gy  Projected Dose: 50 Gy   Narrative:  The patient presents for routine under treatment assessment.  CBCT/MVCT images/Port film x-rays were reviewed.  The chart was checked. Concerned about palpable area near her scar that she noticed during chemo.  Not growing, not painful.   Physical Findings: Weight: 122 lb (55.339 kg). Unchanged skin. In medial aspect of her scar is a round, fluid collection consistent with a seroma  Impression:  The patient is tolerating radiation.  Plan:  Continue treatment as planned. Reassured finding was normal.

## 2013-04-12 NOTE — Progress Notes (Signed)
Patient here for routine weekly assessment of radiation to right breast.Completed 10 25 treatments.Skin with mild tanning.No concerns voiced.

## 2013-04-13 ENCOUNTER — Ambulatory Visit
Admission: RE | Admit: 2013-04-13 | Discharge: 2013-04-13 | Disposition: A | Discharge status: Home / Self Care | Payer: 59 | Source: Ambulatory Visit | Attending: Radiation Oncology | Admitting: Radiation Oncology

## 2013-04-14 ENCOUNTER — Ambulatory Visit
Admission: RE | Admit: 2013-04-14 | Discharge: 2013-04-14 | Disposition: A | Discharge status: Home / Self Care | Payer: 59 | Source: Ambulatory Visit | Attending: Radiation Oncology | Admitting: Radiation Oncology

## 2013-04-15 ENCOUNTER — Ambulatory Visit
Admission: RE | Admit: 2013-04-15 | Discharge: 2013-04-15 | Disposition: A | Discharge status: Home / Self Care | Payer: 59 | Source: Ambulatory Visit | Attending: Radiation Oncology | Admitting: Radiation Oncology

## 2013-04-18 ENCOUNTER — Ambulatory Visit
Admission: RE | Admit: 2013-04-18 | Discharge: 2013-04-18 | Disposition: A | Discharge status: Home / Self Care | Payer: 59 | Source: Ambulatory Visit | Attending: Radiation Oncology | Admitting: Radiation Oncology

## 2013-04-18 ENCOUNTER — Other Ambulatory Visit: Payer: 59 | Admitting: Lab

## 2013-04-18 ENCOUNTER — Telehealth: Payer: Self-pay | Admitting: *Deleted

## 2013-04-18 NOTE — Telephone Encounter (Signed)
Confirmed appt date and time with Dr. Darnelle Catalan and lab on 12/2.  Pt denies further needs at this time.

## 2013-04-19 ENCOUNTER — Ambulatory Visit
Admission: RE | Admit: 2013-04-19 | Discharge: 2013-04-19 | Disposition: A | Discharge status: Home / Self Care | Payer: 59 | Source: Ambulatory Visit | Attending: Radiation Oncology | Admitting: Radiation Oncology

## 2013-04-19 ENCOUNTER — Encounter: Payer: Self-pay | Admitting: Radiation Oncology

## 2013-04-19 VITALS — BP 109/56 | HR 90 | Temp 98.5°F | Wt 120.1 lb

## 2013-04-19 DIAGNOSIS — C50311 Malignant neoplasm of lower-inner quadrant of right female breast: Secondary | ICD-10-CM

## 2013-04-19 NOTE — Progress Notes (Signed)
Weekly Management Note Current Dose: 30  Gy  Projected Dose: 50 Gy   Narrative:  The patient presents for routine under treatment assessment.  CBCT/MVCT images/Port film x-rays were reviewed.  The chart was checked. Doing well. Some arm/shoulder pain on the right which she thinks might be due to using her ipad too much.   Physical Findings: Weight: 120 lb 1.6 oz (54.477 kg). Minimal skin changes on right breast  Impression:  The patient is tolerating radiation.  Plan:  Continue treatment as planned. Refer to ABC class for arm/shoulder pain. Continue radiaplex.

## 2013-04-19 NOTE — Progress Notes (Signed)
Weekly assessment of radiation to right breast.Completed 15 of 25 treatments.Mild tanning of skin.Has intermittent pain in right arm, shoulder,down through fingers.No swelling of arm.Notices more in the evening.Question repetition of typing on ipad.Will try anti-inflammatory like ibuprofen to see if this gives relief.Increased fatigue.

## 2013-04-20 ENCOUNTER — Ambulatory Visit
Admission: RE | Admit: 2013-04-20 | Discharge: 2013-04-20 | Disposition: A | Discharge status: Home / Self Care | Payer: 59 | Source: Ambulatory Visit | Attending: Radiation Oncology | Admitting: Radiation Oncology

## 2013-04-21 ENCOUNTER — Ambulatory Visit
Admission: RE | Admit: 2013-04-21 | Discharge: 2013-04-21 | Disposition: A | Discharge status: Home / Self Care | Payer: 59 | Source: Ambulatory Visit | Attending: Radiation Oncology | Admitting: Radiation Oncology

## 2013-04-21 ENCOUNTER — Other Ambulatory Visit: Payer: Self-pay

## 2013-04-22 ENCOUNTER — Ambulatory Visit
Admission: RE | Admit: 2013-04-22 | Discharge: 2013-04-22 | Disposition: A | Discharge status: Home / Self Care | Payer: 59 | Source: Ambulatory Visit | Attending: Radiation Oncology | Admitting: Radiation Oncology

## 2013-04-25 ENCOUNTER — Ambulatory Visit
Admission: RE | Admit: 2013-04-25 | Discharge: 2013-04-25 | Disposition: A | Discharge status: Home / Self Care | Payer: 59 | Source: Ambulatory Visit | Attending: Radiation Oncology | Admitting: Radiation Oncology

## 2013-04-25 ENCOUNTER — Ambulatory Visit: Payer: 59 | Admitting: Oncology

## 2013-04-26 ENCOUNTER — Encounter: Payer: Self-pay | Admitting: Radiation Oncology

## 2013-04-26 ENCOUNTER — Ambulatory Visit
Admission: RE | Admit: 2013-04-26 | Discharge: 2013-04-26 | Disposition: A | Discharge status: Home / Self Care | Payer: 59 | Source: Ambulatory Visit | Attending: Radiation Oncology | Admitting: Radiation Oncology

## 2013-04-26 VITALS — BP 120/56 | HR 94 | Temp 98.0°F | Wt 118.8 lb

## 2013-04-26 DIAGNOSIS — C50311 Malignant neoplasm of lower-inner quadrant of right female breast: Secondary | ICD-10-CM

## 2013-04-26 NOTE — Progress Notes (Signed)
Name: ELANIA CROWL   MRN: [id number]  Date:  04/26/2013   DOB: [birth year]  Status:outpatient    DIAGNOSIS: Breast cancer.  CONSENT VERIFIED: yes   SET UP: Patient is setup supine   IMMOBILIZATION:  The following immobilization was used:Custom Moldable Pillow, breast board.   NARRATIVE: Marygrace Drought Pinnix underwent complex simulation and treatment planning for her boost treatment today.  Her tumor volume was outlined on the planning CT scan. The depth of her cavity was  Cm.    9  MeV electrons will be prescribed to the 95% Isodose line.   A block will be used for beam modification purposes.  A special port plan is requested.

## 2013-04-26 NOTE — Progress Notes (Signed)
Weekly Management Note Current Dose: 40  Gy  Projected Dose: 60 Gy   Narrative:  The patient presents for routine under treatment assessment.  CBCT/MVCT images/Port film x-rays were reviewed.  The chart was checked.Doing well. Has not signed up for ABC class. Skin is mildly irritated. Mild fatigeu. Working full time.   Physical Findings: Weight: 118 lb 12.8 oz (53.887 kg). Radiation dermatitis over right breast.   Impression:  The patient is tolerating radiation.  Plan:  Continue treatment as planned. Continue radiaplex. She will sign up for ABC class.

## 2013-04-26 NOTE — Addendum Note (Signed)
Encounter addended by: Tessa Lerner, RN on: 04/26/2013 11:32 AM<BR>     Documentation filed: Notes Section

## 2013-04-26 NOTE — Progress Notes (Addendum)
Weekly assessment for radiation to to right breast.Completed 20 of 25.Mild hyperpigmentation.Continue with radiaplex.Mild fatigue but not restrictive.Improvement in right arm discomfort.to schedule appointment for exercises today.Orinda Kenner of [facility] will call patient regarding massage certificates.

## 2013-04-27 ENCOUNTER — Ambulatory Visit
Admission: RE | Admit: 2013-04-27 | Discharge: 2013-04-27 | Disposition: A | Discharge status: Home / Self Care | Payer: 59 | Source: Ambulatory Visit | Attending: Radiation Oncology | Admitting: Radiation Oncology

## 2013-04-28 ENCOUNTER — Ambulatory Visit
Admission: RE | Admit: 2013-04-28 | Discharge: 2013-04-28 | Disposition: A | Discharge status: Home / Self Care | Payer: 59 | Source: Ambulatory Visit | Attending: Radiation Oncology | Admitting: Radiation Oncology

## 2013-04-29 ENCOUNTER — Ambulatory Visit
Admission: RE | Admit: 2013-04-29 | Discharge: 2013-04-29 | Disposition: A | Discharge status: Home / Self Care | Payer: 59 | Source: Ambulatory Visit | Attending: Radiation Oncology | Admitting: Radiation Oncology

## 2013-05-02 ENCOUNTER — Ambulatory Visit
Admission: RE | Admit: 2013-05-02 | Discharge: 2013-05-02 | Disposition: A | Discharge status: Home / Self Care | Payer: 59 | Source: Ambulatory Visit | Attending: Radiation Oncology | Admitting: Radiation Oncology

## 2013-05-03 ENCOUNTER — Ambulatory Visit
Admission: RE | Admit: 2013-05-03 | Discharge: 2013-05-03 | Disposition: A | Discharge status: Home / Self Care | Payer: 59 | Source: Ambulatory Visit | Attending: Radiation Oncology | Admitting: Radiation Oncology

## 2013-05-03 VITALS — BP 114/58 | HR 68 | Temp 98.4°F | Wt 120.3 lb

## 2013-05-03 DIAGNOSIS — C50311 Malignant neoplasm of lower-inner quadrant of right female breast: Secondary | ICD-10-CM

## 2013-05-03 NOTE — Progress Notes (Addendum)
Patient here for weekly assessment of right breast cancer.Moderated discoloration with rash.Continue applying radiaplex.Question about  Palpable pea-size growth on left upper posterior arm smaller than dime.Told her nothing to worry about but will have doctor to take a look at it.Took some Delsym cough medication last weekfor cold symptoms which have resolved.

## 2013-05-03 NOTE — Progress Notes (Signed)
Weekly Management Note Current Dose: 50  Gy  Projected Dose: 60 Gy   Narrative:  The patient presents for routine under treatment assessment.  CBCT/MVCT images/Port film x-rays were reviewed.  The chart was checked. Doing well. New palpable lesions on back and back of left arm. Thought it was due to her bra. No fevers or chills. Not really painful.   Physical Findings: Weight: 120 lb 4.8 oz (54.568 kg). Dermatitis over breast. 1 cm raised rounded lesions on left upper back (red) and posterior aspect of left arm.   Impression:  The patient is tolerating radiation.  Plan:  Continue treatment as planned. ? Folliculitis. Will monitor and if does not improve, refer to derm. Not usual for breast cancer. Discussed continued skin care. Has appt with med onc in a week.

## 2013-05-04 ENCOUNTER — Ambulatory Visit
Admission: RE | Admit: 2013-05-04 | Discharge: 2013-05-04 | Disposition: A | Discharge status: Home / Self Care | Payer: 59 | Source: Ambulatory Visit | Attending: Radiation Oncology | Admitting: Radiation Oncology

## 2013-05-05 ENCOUNTER — Ambulatory Visit
Admission: RE | Admit: 2013-05-05 | Discharge: 2013-05-05 | Disposition: A | Discharge status: Home / Self Care | Payer: 59 | Source: Ambulatory Visit | Attending: Radiation Oncology | Admitting: Radiation Oncology

## 2013-05-06 ENCOUNTER — Ambulatory Visit
Admission: RE | Admit: 2013-05-06 | Discharge: 2013-05-06 | Disposition: A | Discharge status: Home / Self Care | Payer: 59 | Source: Ambulatory Visit | Attending: Radiation Oncology | Admitting: Radiation Oncology

## 2013-05-09 ENCOUNTER — Ambulatory Visit
Admission: RE | Admit: 2013-05-09 | Discharge: 2013-05-09 | Disposition: A | Discharge status: Home / Self Care | Payer: 59 | Source: Ambulatory Visit | Attending: Radiation Oncology | Admitting: Radiation Oncology

## 2013-05-10 ENCOUNTER — Encounter: Payer: Self-pay | Admitting: Radiation Oncology

## 2013-05-10 ENCOUNTER — Ambulatory Visit
Admission: RE | Admit: 2013-05-10 | Discharge: 2013-05-10 | Disposition: A | Discharge status: Home / Self Care | Payer: 59 | Source: Ambulatory Visit | Attending: Radiation Oncology | Admitting: Radiation Oncology

## 2013-05-10 ENCOUNTER — Other Ambulatory Visit: Payer: 59

## 2013-05-10 VITALS — BP 134/58 | HR 82 | Temp 98.5°F | Wt 119.9 lb

## 2013-05-10 DIAGNOSIS — C50311 Malignant neoplasm of lower-inner quadrant of right female breast: Secondary | ICD-10-CM

## 2013-05-10 MED ORDER — RADIAPLEXRX EX GEL
Freq: Once | CUTANEOUS | Status: AC
  Administered 2013-05-10: 10:00:00 via TOPICAL

## 2013-05-10 NOTE — Progress Notes (Signed)
Patient completes 30 of 30 treatments to right breast.Marked hyperpigmentation.Given tube of radiaplex and informed to later start applying lotion with vitamin E.Increased fatigue.Emotional today  But looking forward to next stage of life after treatment. To schedule one month follow up.Will get itemization of bill for aflac policy.

## 2013-05-10 NOTE — Progress Notes (Signed)
  Radiation Oncology         (336) [phone] ________________________________  Name: [name] MRN: [id number]  Date: 05/10/2013  DOB: April 25, 1961  End of Treatment Note  Diagnosis:   T2N0 Invasive right breast cancer     Indication for treatment:  Curative       Radiation treatment dates:   03/30/13-05/10/13  Site/dose:      Right Breast/ 45 Gy    Right Breast Boost/16 Gy  Beams/energy:   Tangents/ 6 MV photons         Boost/ 9 MeV electrons  Narrative: The patient tolerated radiation treatment relatively well.   She had the expected dry desquamation.  She worked throughout treatment.   Plan: The patient has completed radiation treatment. The patient will return to radiation oncology clinic for routine followup in one month. I advised her to call or return sooner if they have any questions or concerns related to recovery or treatment. She will begin applying lotion with vitamin E after 2 weeks. She also has regularly scheduled follow up with medical oncology  ------------------------------------------------  Lurline Hare, MD

## 2013-05-11 ENCOUNTER — Ambulatory Visit: Payer: 59

## 2013-05-15 NOTE — Progress Notes (Signed)
°  Radiation Oncology         (336) [phone] ________________________________  Name: [name] MRN: [id number]  Date: 05/10/2013  DOB: 06-22-1960  End of Treatment Note  Diagnosis:   T2N0 right breast cancer     Indication for treatment:  Curative       Radiation treatment dates:   03/30/2013-05/10/2013  Site/dose:   Right breast / 45 Gray @ 1.8 Wallace Cullens per fraction x 25 fractions Right breast boost / 16 Gray at [employer] per fraction x 8 fractions  Beams/energy:  Opposed Tangents / 6 MV photons En face / 9 MeV electrons  Narrative: The patient tolerated radiation treatment relatively well.   Was able to work during treatment. She had the expected skin darkening.  Plan: The patient has completed radiation treatment. The patient will return to radiation oncology clinic for routine followup in one month. I advised them to call or return sooner if they have any questions or concerns related to their recovery or treatment.  ------------------------------------------------  Lurline Hare, MD

## 2013-05-16 ENCOUNTER — Ambulatory Visit: Payer: 59

## 2013-05-17 ENCOUNTER — Ambulatory Visit (HOSPITAL_BASED_OUTPATIENT_CLINIC_OR_DEPARTMENT_OTHER): Payer: 59 | Admitting: Oncology

## 2013-05-17 ENCOUNTER — Other Ambulatory Visit (HOSPITAL_BASED_OUTPATIENT_CLINIC_OR_DEPARTMENT_OTHER): Payer: 59

## 2013-05-17 ENCOUNTER — Ambulatory Visit: Payer: 59

## 2013-05-17 VITALS — BP 100/58 | HR 100 | Temp 98.2°F | Resp 18 | Ht 63.0 in | Wt 120.7 lb

## 2013-05-17 DIAGNOSIS — C50311 Malignant neoplasm of lower-inner quadrant of right female breast: Secondary | ICD-10-CM

## 2013-05-17 DIAGNOSIS — Z170 Estrogen receptor positive status [ER+]: Secondary | ICD-10-CM

## 2013-05-17 DIAGNOSIS — C50319 Malignant neoplasm of lower-inner quadrant of unspecified female breast: Secondary | ICD-10-CM

## 2013-05-17 LAB — COMPREHENSIVE METABOLIC PANEL (CC13)
ALT: 16 U/L (ref 0–55)
AST: 15 U/L (ref 5–34)
Albumin: 4.1 g/dL (ref 3.5–5.0)
Alkaline Phosphatase: 67 U/L (ref 40–150)
Anion Gap: 14 mEq/L — ABNORMAL HIGH (ref 3–11)
BUN: 12.8 mg/dL (ref 7.0–26.0)
Chloride: 106 mEq/L (ref 98–109)
Creatinine: 1 mg/dL (ref 0.6–1.1)
Glucose: 90 mg/dl (ref 70–140)
Potassium: 4.6 mEq/L (ref 3.5–5.1)
Sodium: 144 mEq/L (ref 136–145)

## 2013-05-17 LAB — CBC WITH DIFFERENTIAL/PLATELET
BASO%: 0.8 % (ref 0.0–2.0)
Basophils Absolute: 0.1 10*3/uL (ref 0.0–0.1)
EOS%: 3 % (ref 0.0–7.0)
Eosinophils Absolute: 0.2 10*3/uL (ref 0.0–0.5)
HGB: 12.2 g/dL (ref 11.6–15.9)
MCH: 28.4 pg (ref 25.1–34.0)
MCHC: 31.1 g/dL — ABNORMAL LOW (ref 31.5–36.0)
MCV: 91.4 fL (ref 79.5–101.0)
MONO%: 8.3 % (ref 0.0–14.0)
RDW: 12.7 % (ref 11.2–14.5)
lymph#: 1.2 10*3/uL (ref 0.9–3.3)

## 2013-05-17 MED ORDER — TAMOXIFEN CITRATE 20 MG PO TABS: 20.0000 mg | ORAL_TABLET | Freq: Every day | ORAL | Status: DC

## 2013-05-17 NOTE — Progress Notes (Signed)
ID: [name] OB: 01-Apr-1961  MR#: [id number]  WUJ#:[id number]  PCP: Elizabeth Found, MD GYN:   [name]]; [name] OTHER MD: [name], [name], [name]   HISTORY OF PRESENT ILLNESS: [name] had a routine colonoscopy under [name] approximately a year and a half ago, showing a right colon lesion which could not be removed intraoperatively. CT scans of the abdomen and pelvis 07/07/2011 were negative except for the question of a soft tissue lesion in the right colon, and particularly there was no adenopathy or evidence of metastatic disease. She underwent partial colectomy under [name] 07/28/2011, and the pathology from that procedure ([name] [id number]) showed a 3.2 cm tubular adenoma without high-grade dysplasia or malignancy. Margins were negative. 0 of 16 lymph nodes were involved.  And the patient has been working at weight loss and managed to lose approximately 25 pounds over the past year. She feels this is what allowed her to palpate a mass in her right breast earlier this month. (She had had negative mammographic screening 02/26/2012). On 10/27/2012 the patient underwent bilateral diagnostic mammography. The breasts are "extremely dense". Mammography did not show any new or worrisome finding. Right breast ultrasound however located an irregular hypoechoic mass in the area of the patient's palpable abnormality. It measured approximately 7 mm. Biopsy of this mass 10/28/2012 showed an invasive ductal carcinoma, grade 2, estrogen and progesterone receptor positive, with no HER-2 amplification, and an MIB-1 of 5%.  Bilateral breast MRI 11/01/2012 Gonzalez a 1.3 cm irregular enhancing mass in the lower inner quadrant of the right breast, with no other masses of concern in either breast and no abnormal appearing lymph nodes.  The patient's subsequent history is as detailed below.   INTERVAL HISTORY: [name] returns today  accompanied by her husband [name] for followup of her right breast cancer . Since her last visit here she completed her radiation treatments. She had no significant desquamation and fatigue was moderate. She still using the creams and some vitamin E. Unfortunately she is not exercising.  REVIEW OF SYSTEMS: [name] had no complaints today and a written detailed review of systems was entirely negative.  PAST MEDICAL HISTORY: Past Medical History  Diagnosis Date  . Asthma     as a child, early adult...none now  . Recurrent upper respiratory infection (URI)     started 1/28.....getting better  . GERD (gastroesophageal reflux disease)     occas  at  night....twice a  month--otc meds  . Depression   . Anxiety   . Colonic mass     ascending  . Breast cancer 10/28/12    right breast    PAST SURGICAL HISTORY: Past Surgical History  Procedure Laterality Date  . Tubal ligation    . Hemicolectomy  07/28/11  . Breast biopsy Right 10/28/12    Invasive mammary ca, ER/PR=+, Her2 Neu-  . Appendectomy      FAMILY HISTORY Family History  Problem Relation Age of Onset  . Heart disease Father   . Cancer Paternal Aunt     unknown form of cancer  . Lung cancer Maternal Grandmother   . Testicular cancer Cousin     paternal cousin; died in 52s  . Pancreatic cancer Cousin 40  . Colon cancer Other     paternal grandmother's sister  . Cancer Other     MGM's sister   the patient's father died from congestive far to failure at the age of 31. The patient's mother  is still living, in her early 64s. The patient has one brother, 4 sisters. There is no history of breast or ovarian cancer in the family to the patient's knowledge.  GYNECOLOGIC HISTORY:  Menarche age 53, first live birth age 63. The patient's less. It was September 2013 and the one before that was March 2013. She is not taking hormone replacement. She did take oral contraceptives for several years intermittently in the past. There were no  complications.   SOCIAL HISTORY:  [name] is originally from [place]. 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 She is not a counselor herself, but her husband, [name] (goes by "[name]") works as a Veterinary surgeon for the [employer] as well as having his own clinic in [place]. He is originally from [place]. The patient's children are [name], who lives in [place] and has a Scientist, water quality in counseling; and [name], who is a Consulting civil engineer at [employer] He lives with the patient, and shares custody of his daughter, who is 44 years old and spends every other weekend at the patient's home.    ADVANCED DIRECTIVES: In place   HEALTH MAINTENANCE: History  Substance Use Topics  . Smoking status: Never Smoker   . Smokeless tobacco: Never Used  . Alcohol Use: Yes     Comment: wine occasionally     Colonoscopy: 06/02/2011 / [name]  PAP:  Bone density:  Lipid panel:  No Known Allergies    Filed Vitals:   05/17/13 1620  BP: 100/58  Pulse: 100  Temp: 98.2 F (36.8 C)  Resp: 18     Body mass index is 21.39 kg/(m^2).    ECOG FS: 1 Filed Weights   05/17/13 1620  Weight: 120 lb 11.2 oz (54.749 kg)    OBJECTIVE: Middle-aged white woman in no acute distress Sclerae unicteric, pupils equal and round Oropharynx shows no thrush or ulcerations No cervical or supraclavicular adenopathy Lungs clear to auscultation, good excursion bilaterally Heart regular rate and rhythm Abdomen soft, nontender, positive bowel sounds.  MSK no focal spinal tenderness, no peripheral edema Neuro: nonfocal, well oriented, pleasant affect Breasts: The right breast is status post lumpectomy. There is no evidence of local recurrence. There is still erythema from the recent radiation, but no desquamation The right axilla is benign. The left breast is unremarkable    LAB RESULTS:   Lab Results  Component Value Date   WBC 7.4 05/17/2013   NEUTROABS  5.3 05/17/2013   HGB 12.2 05/17/2013   HCT 39.2 05/17/2013   MCV 91.4 05/17/2013   PLT 291 05/17/2013      Chemistry      Component Value Date/Time   NA 144 05/17/2013 1555   NA 139 07/30/2011 0615   K 4.6 05/17/2013 1555   K 4.1 07/30/2011 0615   CL 102 11/03/2012 0821   CL 104 07/30/2011 0615   CO2 25 05/17/2013 1555   CO2 25 07/30/2011 0615   BUN 12.8 05/17/2013 1555   BUN 5* 07/30/2011 0615   CREATININE 1.0 05/17/2013 1555   CREATININE 0.68 07/30/2011 0615      Component Value Date/Time   CALCIUM 9.7 05/17/2013 1555   CALCIUM 8.8 07/30/2011 0615   ALKPHOS 67 05/17/2013 1555   ALKPHOS 56 07/22/2011 0845   AST 15 05/17/2013 1555   AST 14 07/22/2011 0845   ALT 16 05/17/2013 1555   ALT 14 07/22/2011 0845   BILITOT 0.20 05/17/2013 1555   BILITOT 0.3 07/22/2011 0845  STUDIES:  No results Gonzalez.   ASSESSMENT: 52 y.o. [place] woman status post right breast lower inner quadrant biopsy 10/28/2012 for a clinical T1c N0, stage IA invasive ductal carcinoma, grade 2, estrogen and progesterone receptor positive, HER-2 not amplified, with an MIB-1 of 20%  (1) s/p Right lumpectomy and sentinel lymph node sampling 11/17/2012 for a pT2 pN0, stage IIA invasive ductal carcinoma with prominent lobular features, grade 2, with initially positive  margins cleared intraoperatively; estrogen receptor positive with an Allred score of 7, progesterone receptor positive with an Allred score of 8, HercepTest 0, proliferation index by ACIS III 32% ([id number])   (2) Oncotype DX score of 33 predicting a risk of distant recurrence within 10 years of 23% if her only systemic treatment is tamoxifen for 5 years; the estrogen receptor was interpreted as negative on the Oncotype report  (3) adjuvant cyclophosphamide and docetaxel started 12/30/2012, 4 cycles planned, with Neulasta support on day 2.  First cycle given at [facility], with the remaining cycles given  at [facility] and completed 03/03/2013  (4)  adjuvant radiation therapy  completed 05/10/2013  (5) to start tamoxifen January 20 15th  PLAN:  Cheryel has completed her local treatment and is now ready to start antiestrogen therapy. We discussed the possible toxicities, side effects and complications of tamoxifen versus the aromatase inhibitors. However since she was still menstruating (was perimenopausal) at the start of chemotherapy, I think it would be safer to start with tamoxifen.  1 advantage of this medication is that it would allow her to use local estrogen preparation such a vaginal dryness becomes a significant issue during menopause. Jaleesa is aware of the risk of endometrial cancer and the fact that we do not not a screening for it. She is also aware of the risk of hypercoagulability, and in that regard it is reassuring that she took oral contraceptives for many years with no complications.  The patient has extremely dense breasts, and that combined with her history of breast cancer history greater than 20% lifetime risk of developing another breast cancer. Accordingly she qualifies for yearly MRIs, and these will be ordered with her next mammography in 2015.  Am referring her back to interventional radiology to remove her port. She will see me approximately 2 months after starting tamoxifen. She knows to call for any problems that may develop before that visit.  Lowella Dell, MD   05/17/2013 4:54 PM

## 2013-05-18 ENCOUNTER — Encounter: Payer: Self-pay | Admitting: Oncology

## 2013-05-18 ENCOUNTER — Telehealth: Payer: Self-pay | Admitting: Oncology

## 2013-05-18 NOTE — Telephone Encounter (Signed)
Gave pt appt for MD only on April 2015, also gave her telephone number to IR for portacath removal

## 2013-05-19 ENCOUNTER — Other Ambulatory Visit: Payer: Self-pay | Admitting: *Deleted

## 2013-05-20 ENCOUNTER — Other Ambulatory Visit: Payer: Self-pay | Admitting: Radiology

## 2013-05-20 ENCOUNTER — Other Ambulatory Visit (HOSPITAL_COMMUNITY): Payer: Self-pay | Admitting: Radiology

## 2013-05-20 ENCOUNTER — Encounter: Payer: Self-pay | Admitting: Oncology

## 2013-05-20 ENCOUNTER — Telehealth: Payer: Self-pay | Admitting: *Deleted

## 2013-05-20 NOTE — Telephone Encounter (Signed)
Returned Patient's phone call . She saw Dr. Darnelle Catalan on Dec.2 had a small cough. Patient said the cough has progressed and she has an appt with her PCP at 11:00 this morning. S he called to make sur eif we

## 2013-05-20 NOTE — Addendum Note (Signed)
Addended by: Billey Co on: 05/20/2013 12:13 PM   Modules accepted: Orders, Medications

## 2013-05-20 NOTE — Telephone Encounter (Signed)
She called to make sure if we thought it was okay for her to see PCP about this condition. Advised her this was fine. Pt verbalized understanding.

## 2013-05-23 ENCOUNTER — Encounter (HOSPITAL_COMMUNITY): Payer: Self-pay | Admitting: Pharmacy Technician

## 2013-05-25 ENCOUNTER — Ambulatory Visit (HOSPITAL_COMMUNITY)
Admission: RE | Admit: 2013-05-25 | Discharge: 2013-05-25 | Disposition: A | Discharge status: Home / Self Care | Payer: 59 | Source: Ambulatory Visit | Attending: Oncology | Admitting: Oncology

## 2013-05-25 ENCOUNTER — Encounter (HOSPITAL_COMMUNITY): Payer: Self-pay

## 2013-05-25 DIAGNOSIS — C50919 Malignant neoplasm of unspecified site of unspecified female breast: Secondary | ICD-10-CM | POA: Insufficient documentation

## 2013-05-25 DIAGNOSIS — C50311 Malignant neoplasm of lower-inner quadrant of right female breast: Secondary | ICD-10-CM

## 2013-05-25 HISTORY — DX: Personal history of antineoplastic chemotherapy: Z92.21

## 2013-05-25 MED ORDER — FENTANYL CITRATE 0.05 MG/ML IJ SOLN
INTRAMUSCULAR | Status: AC
  Filled 2013-05-25: qty 2

## 2013-05-25 MED ORDER — CEFAZOLIN SODIUM-DEXTROSE 2-3 GM-% IV SOLR
2.0000 g | Freq: Once | INTRAVENOUS | Status: AC
  Administered 2013-05-25: 2 g via INTRAVENOUS
  Filled 2013-05-25: qty 50

## 2013-05-25 MED ORDER — LIDOCAINE-EPINEPHRINE (PF) 2 %-1:200000 IJ SOLN
INTRAMUSCULAR | Status: AC
  Filled 2013-05-25: qty 20

## 2013-05-25 MED ORDER — MIDAZOLAM HCL 2 MG/2ML IJ SOLN
INTRAMUSCULAR | Status: AC
  Filled 2013-05-25: qty 2

## 2013-05-25 MED ORDER — SODIUM CHLORIDE 0.9 % IV SOLN
INTRAVENOUS | Status: DC
  Administered 2013-05-25: 10:00:00 via INTRAVENOUS

## 2013-05-25 MED ORDER — LIDOCAINE HCL 1 % IJ SOLN
INTRAMUSCULAR | Status: AC
  Filled 2013-05-25: qty 20

## 2013-05-25 NOTE — Procedures (Signed)
Successful LT IJ PORT REMOVAL  NO COMP STABLE

## 2013-05-31 ENCOUNTER — Encounter: Payer: Self-pay | Admitting: Oncology

## 2013-06-06 ENCOUNTER — Telehealth: Payer: Self-pay | Admitting: *Deleted

## 2013-06-06 NOTE — Telephone Encounter (Signed)
See other note from today

## 2013-06-06 NOTE — Telephone Encounter (Signed)
This RN spoke with patient regarding Wellbutrin and Tamoxifen. Per Dr. Darnelle Catalan, it's OK to take Wellbutrin and Tamoxifen together, there are no studies that show a contraindication. Dr. Darnelle Catalan recommends that the patient start taking Caltrate, one tablet by mouth twice a day to help the bones stay stronger. Patient stated, "my PCP gave me my bone density report numbers."  Patient verbalized understanding of this conversation and will call the office if she has any further questions.

## 2013-06-14 ENCOUNTER — Telehealth: Payer: Self-pay | Admitting: Genetic Counselor

## 2013-06-14 NOTE — Telephone Encounter (Signed)
Left message for [name] to [facility] and schedule blood draw for wife.

## 2013-06-14 NOTE — Telephone Encounter (Signed)
Discussed setting up a blood draw for his wife.  We can use [facility], even though they do not meet criteria for testing, as [facility] will not charge more than $100 for their test.  The [facility] will set up a blood draw next week and let me know when the appointment is so that I can have a test kit in the lab.

## 2013-06-15 ENCOUNTER — Telehealth: Payer: Self-pay | Admitting: Genetic Counselor

## 2013-06-15 NOTE — Telephone Encounter (Signed)
Discussed that a test kit will be in the lab.  They need to register and get blood drawn.  Results will take 2-3 weeks.

## 2013-06-15 NOTE — Telephone Encounter (Signed)
PT HUSBAND CALLED TO SCHEDULED LAB DRAW FOR GENETIC PT SCHEDULED FOR 01/07 @ 9.  KAREN NOTIFY OF APPOINTMENT

## 2013-06-16 DIAGNOSIS — Z1371 Encounter for nonprocreative screening for genetic disease carrier status: Secondary | ICD-10-CM

## 2013-06-16 HISTORY — DX: Encounter for nonprocreative screening for genetic disease carrier status: Z13.71

## 2013-06-22 ENCOUNTER — Other Ambulatory Visit: Payer: 59

## 2013-06-22 DIAGNOSIS — C50311 Malignant neoplasm of lower-inner quadrant of right female breast: Secondary | ICD-10-CM

## 2013-06-24 ENCOUNTER — Encounter: Payer: Self-pay | Admitting: Oncology

## 2013-06-30 ENCOUNTER — Ambulatory Visit
Admission: RE | Admit: 2013-06-30 | Discharge: 2013-06-30 | Disposition: A | Discharge status: Home / Self Care | Payer: 59 | Source: Ambulatory Visit | Attending: Radiation Oncology | Admitting: Radiation Oncology

## 2013-06-30 VITALS — BP 126/64 | HR 86 | Temp 98.4°F | Wt 120.0 lb

## 2013-06-30 DIAGNOSIS — C50311 Malignant neoplasm of lower-inner quadrant of right female breast: Secondary | ICD-10-CM

## 2013-06-30 NOTE — Progress Notes (Signed)
Department of Radiation Oncology  Phone:  [phone] Fax:        [phone]   Name: [name] MRN: [id number]  DOB: 31-Mar-1961  Date: 06/30/2013  Follow Up Visit Note  Diagnosis: T2 N0 right breast cancer  Summary and Interval since last radiation: 6 weeks from radiation to the right breast to a total dose of 61 gray completed 05/10/2013  Interval History: [name] presents today for routine followup.  She and her husband enjoyed their trip to [place]. They're leaving for their cruise here in a week or so. She feels like she has become more irritable and emotional. She attributes this to the tamoxifen. She also states she feels like she is to be "happy" all the time. She does have a positive attitude to help out her immune system. She continues to work. She is seeing a Social worker and has increased her Wellbutrin dose. Her skin is healed up well and she is pleased with her cosmetic result. She has signed up for our finding her new normal survivorship class.  Allergies:  Allergies  Allergen Reactions  . Morphine And Related Itching    Medications:  Current Outpatient Prescriptions  Medication Sig Dispense Refill  . amphetamine-dextroamphetamine (ADDERALL XR) 15 MG 24 hr capsule Take 15 mg by mouth every morning.       . clindamycin-benzoyl peroxide (BENZACLIN) gel Apply 1 application topically at bedtime as needed (for face).       . clonazePAM (KLONOPIN) 0.5 MG tablet Take 0.5 mg by mouth 3 (three) times daily as needed for anxiety.       Marland Kitchen ibuprofen (ADVIL,MOTRIN) 200 MG tablet Take 200 mg by mouth every 6 (six) hours as needed for mild pain or moderate pain.       Marland Kitchen oxybutynin (DITROPAN-XL) 10 MG 24 hr tablet Take 10 mg by mouth daily.       . polyethylene glycol (MIRALAX / GLYCOLAX) packet Take 17 g by mouth daily as needed for mild constipation or moderate constipation.       . ranitidine (ZANTAC) 150 MG tablet Take 150 mg by mouth at bedtime.      . tamoxifen  (NOLVADEX) 20 MG tablet Take 20 mg by mouth daily.      Marland Kitchen zolpidem (AMBIEN) 10 MG tablet Take 10 mg by mouth at bedtime.       Marland Kitchen buPROPion (WELLBUTRIN XL) 150 MG 24 hr tablet Take 150 mg by mouth every morning. Takes 2 150 mg to equal 300 mg.      . loratadine (CLARITIN) 10 MG tablet Take one tablet by mouth the morning of neulasta (day 2 chemo) and the next 3 days  30 tablet  2   No current facility-administered medications for this encounter.    Physical Exam:  Filed Vitals:   06/30/13 1431  BP: 126/64  Pulse: 86  Temp: 98.4 F (36.9 C)  Weight: 120 lb (54.432 kg)  SpO2: 100%   she has a well-healed skin over the right breast. She has an excellent cosmetic result.  IMPRESSION: [name] is a 53 y.o. female status post radiation to the right breast with resolving acute effects of treatment  PLAN:  Her skin looks great. We discussed some protection the treated area. She will call me if any questions or concerns. She has an appointment Dr. Randell Patient not soon. We discussed the need for yearly mammograms. We discussed the process of grief and sometimes the delayed processing of a cancer diagnosis. I encouraged her  to processed what she has been through and I think our survivorship class will be very helpful for her.    Thea Silversmith, MD

## 2013-06-30 NOTE — Progress Notes (Signed)
Patient for routine follow up completion of radiation to right breast on 05/10/13.Skin is mildly tanned.started tamoifen on June 16, 2013.Having problems with increased depression/mood swings since start of tamoxifen.Wellbutrin has been increased from 150 mg to 300 mg daily.Pain in right shoulder found to be breaking off of calcifications.

## 2013-07-08 ENCOUNTER — Telehealth: Payer: Self-pay | Admitting: Genetic Counselor

## 2013-07-08 NOTE — Telephone Encounter (Signed)
Left good news message on machine. 

## 2013-07-11 ENCOUNTER — Other Ambulatory Visit: Payer: Self-pay | Admitting: Oncology

## 2013-07-11 ENCOUNTER — Telehealth: Payer: Self-pay | Admitting: Genetic Counselor

## 2013-07-11 ENCOUNTER — Encounter: Payer: Self-pay | Admitting: Genetic Counselor

## 2013-07-11 NOTE — Progress Notes (Unsigned)
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

## 2013-07-11 NOTE — Telephone Encounter (Signed)
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

## 2013-07-12 ENCOUNTER — Telehealth: Payer: Self-pay | Admitting: Oncology

## 2013-07-12 NOTE — Telephone Encounter (Signed)
Faxed pt medical records to [facility] - Dr. Harden Mo

## 2013-07-28 ENCOUNTER — Encounter: Payer: Self-pay | Admitting: Nurse Practitioner

## 2013-07-28 ENCOUNTER — Ambulatory Visit (INDEPENDENT_AMBULATORY_CARE_PROVIDER_SITE_OTHER): Payer: 59 | Admitting: Nurse Practitioner

## 2013-07-28 VITALS — BP 100/66 | HR 88 | Ht 63.0 in | Wt 122.0 lb

## 2013-07-28 DIAGNOSIS — Z0000 Encounter for general adult medical examination without abnormal findings: Secondary | ICD-10-CM

## 2013-07-28 DIAGNOSIS — Z01419 Encounter for gynecological examination (general) (routine) without abnormal findings: Secondary | ICD-10-CM

## 2013-07-28 NOTE — Patient Instructions (Signed)

## 2013-07-28 NOTE — Progress Notes (Signed)
Patient ID: [name], female   DOB: 04-19-1961, 53 y.o.   MRN: [id number] 53 y.o. [place] from [place], [place]. Married Caucasian Fe here for NGYN annual exam.  Diagnosed in 10/2012 for breast cancer.  Had surgery at [facility] with partial mastectomy.  Completed chemo and radiation 05/10/13 and chemo 02/14/13.  doing well and will be scheduling mammogram in May.BR CA I and II was negative.  No LMP recorded. Patient has had an ablation.          Sexually active: yes  The current method of family planning is tubal ligation and ablation.    Exercising: no  The patient does not participate in regular exercise at present. Smoker:  no  Health Maintenance: Pap:  06/22/12, WNL-  history of atypical in the remote past. MMG:  10/29/12, MRI, breast cancer Colonoscopy:  05/2013, normal, repeat in 5 years BMD:   05/09/12, osteopenia right hip TDaP:  04/24/2011 Labs: 05/2013 at [facility]   reports that she has never smoked. She has never used smokeless tobacco. She reports that she drinks alcohol. She reports that she does not use illicit drugs.  Past Medical History  Diagnosis Date  . Asthma     as a child, early adult...none now  . Recurrent upper respiratory infection (URI)     started 1/28.....getting better  . GERD (gastroesophageal reflux disease)     occas  at  night....twice a  month--otc meds  . Depression   . Anxiety   . Colonic mass 07/28/2011    ascending colon and found a polyp 2.5 cm adenoma - hemicoloectomy  . Breast cancer 10/28/12    right breast  . S/P chemotherapy, time since greater than 12 weeks   . BRCA negative 06/2013    BRCA I & II negative    Past Surgical History  Procedure Laterality Date  . Hemicolectomy  07/28/11  . Breast biopsy Right 10/28/12    Invasive mammary ca, ER/PR=+, Her2 Neu-  . Appendectomy    . Tubal ligation  2006  . Endometrial ablation  2006    HTA    Current Outpatient Prescriptions  Medication Sig Dispense Refill  .  amphetamine-dextroamphetamine (ADDERALL XR) 15 MG 24 hr capsule Take 15 mg by mouth every morning.       . clonazePAM (KLONOPIN) 0.5 MG tablet Take 0.5 mg by mouth 3 (three) times daily as needed for anxiety.       Marland Kitchen ibuprofen (ADVIL,MOTRIN) 200 MG tablet Take 200 mg by mouth every 6 (six) hours as needed for mild pain or moderate pain.       Marland Kitchen lamoTRIgine (LAMICTAL) 25 MG tablet Take 25 mg by mouth daily.      Marland Kitchen oxybutynin (DITROPAN-XL) 10 MG 24 hr tablet Take 10 mg by mouth daily.       . polyethylene glycol (MIRALAX / GLYCOLAX) packet Take 17 g by mouth daily as needed for mild constipation or moderate constipation.       . ranitidine (ZANTAC) 150 MG tablet Take 150 mg by mouth at bedtime.      . tamoxifen (NOLVADEX) 20 MG tablet Take 20 mg by mouth daily.      Marland Kitchen zolpidem (AMBIEN) 10 MG tablet Take 10 mg by mouth at bedtime.       . clindamycin-benzoyl peroxide (BENZACLIN) gel Apply 1 application topically at bedtime as needed (for face).       . loratadine (CLARITIN) 10 MG tablet Take one tablet by  mouth the morning of neulasta (day 2 chemo) and the next 3 days  30 tablet  2   No current facility-administered medications for this visit.    Family History  Problem Relation Age of Onset  . Heart disease Father   . Cancer Paternal Aunt     unknown form of cancer  . Lung cancer Maternal Grandmother   . Testicular cancer Cousin     paternal cousin; died in 96s  . Pancreatic cancer Cousin 9  . Colon cancer Other     paternal grandmother's sister  . Cancer Other     MGM's sister    ROS:  Pertinent items are noted in HPI.  Otherwise, a comprehensive ROS was negative.  Exam:   BP 100/66  Pulse 88  Ht $R'5\' 3"'HE$  (1.6 m)  Wt 122 lb (55.339 kg)  BMI 21.62 kg/m2 Height: $RemoveBefor'5\' 3"'AwDnCEmOIsTb$  (160 cm)  Ht Readings from Last 3 Encounters:  07/28/13 $RemoveB'5\' 3"'dUuNwzyO$  (1.6 m)  05/17/13 $RemoveB'5\' 3"'bleeJQIm$  (1.6 m)  03/15/13 $RemoveB'5\' 3"'OrtKBGmV$  (1.6 m)    General appearance: alert, cooperative and appears stated age Head: Normocephalic, without  obvious abnormality, atraumatic Neck: no adenopathy, supple, symmetrical, trachea midline and thyroid normal to inspection and palpation Lungs: clear to auscultation bilaterally Breasts: normal appearance, no masses or tenderness, on the left, surgical and chemo changes on the right. Heart: regular rate and rhythm Abdomen: soft, non-tender; no masses,  no organomegaly Extremities: extremities normal, atraumatic, no cyanosis or edema Skin: Skin color, texture, turgor normal. No rashes or lesions Lymph nodes: Cervical, supraclavicular, and axillary nodes normal. No abnormal inguinal nodes palpated Neurologic: Grossly normal   Pelvic: External genitalia:  no lesions              Urethra:  normal appearing urethra with no masses, tenderness or lesions              Bartholin's and Skene's: normal                 Vagina: very atrophic  appearing vagina with pale color and discharge, no lesions              Cervix: anteverted              Pap taken: yes Bimanual Exam:  Uterus:  normal size, contour, position, consistency, mobility, non-tender              Adnexa: no mass, fullness, tenderness               Rectovaginal: Confirms               Anus:  normal sphincter tone, no lesions  A:  Well Woman with normal exam  S/P HTA with BTL 2006  Remote history of abnormal pap slight atypia only and repeats were normal  S/P Hemicolectomy for adenomatous polyp 2013  S/P Right Breast Cancer with partial mastectomy and chemo and radiation 11/2012  P:   Pap smear as per guidelines Done today  Mammogram due 10/2013  Counseled on breast self exam, mammography screening, adequate intake of calcium and vitamin D, diet and exercise, Kegel's exercises return annually or prn  An After Visit Summary was printed and given to the patient.  Daughter is a patient of mine - Manufacturing engineer

## 2013-08-01 LAB — IPS PAP TEST WITH HPV

## 2013-08-02 NOTE — Progress Notes (Signed)
Encounter reviewed by Dr. Brook Silva.  

## 2013-08-11 ENCOUNTER — Ambulatory Visit (HOSPITAL_BASED_OUTPATIENT_CLINIC_OR_DEPARTMENT_OTHER): Payer: 59 | Admitting: Oncology

## 2013-08-11 VITALS — BP 103/57 | HR 82 | Temp 97.2°F | Resp 18 | Ht 63.0 in | Wt 123.5 lb

## 2013-08-11 DIAGNOSIS — C50311 Malignant neoplasm of lower-inner quadrant of right female breast: Secondary | ICD-10-CM

## 2013-08-11 DIAGNOSIS — K219 Gastro-esophageal reflux disease without esophagitis: Secondary | ICD-10-CM

## 2013-08-11 DIAGNOSIS — N951 Menopausal and female climacteric states: Secondary | ICD-10-CM

## 2013-08-11 DIAGNOSIS — K5900 Constipation, unspecified: Secondary | ICD-10-CM

## 2013-08-11 DIAGNOSIS — C50319 Malignant neoplasm of lower-inner quadrant of unspecified female breast: Secondary | ICD-10-CM

## 2013-08-11 DIAGNOSIS — D649 Anemia, unspecified: Secondary | ICD-10-CM

## 2013-08-11 DIAGNOSIS — F4310 Post-traumatic stress disorder, unspecified: Secondary | ICD-10-CM

## 2013-08-11 MED ORDER — GABAPENTIN 300 MG PO CAPS: 300.0000 mg | ORAL_CAPSULE | Freq: Every day | ORAL | Status: DC

## 2013-08-11 NOTE — Progress Notes (Signed)
ID: [name] OB: 1961/04/28  MR#: [id number]  UYQ#:[id number]  PCP: Elizabeth Naas, MD GYN:   [name]]; [name] OTHER MD: [name], [name], [name], [name]  HISTORY OF PRESENT ILLNESS: [name] had a routine colonoscopy under [name] approximately a year and a half ago, showing a right colon lesion which could not be removed intraoperatively. CT scans of the abdomen and pelvis 07/07/2011 were negative except for the question of a soft tissue lesion in the right colon, and particularly there was no adenopathy or evidence of metastatic disease. She underwent partial colectomy under Dr. Madilyn Gonzalez 07/28/2011, and the pathology from that procedure ([id number]) showed a 3.2 cm tubular adenoma without high-grade dysplasia or malignancy. Margins were negative. 0 of 16 lymph nodes were involved.  And the patient has been working at weight loss and managed to lose approximately 25 pounds over the past year. She feels this is what allowed her to palpate a mass in her right breast earlier this month. (She had had negative mammographic screening 02/26/2012). On 10/27/2012 the patient underwent bilateral diagnostic mammography. The breasts are "extremely dense". Mammography did not show any new or worrisome finding. Right breast ultrasound however located an irregular hypoechoic mass in the area of the patient's palpable abnormality. It measured approximately 7 mm. Biopsy of this mass 10/28/2012 showed an invasive ductal carcinoma, grade 2, estrogen and progesterone receptor positive, with no HER-2 amplification, and an MIB-1 of 5%.  Bilateral breast MRI 11/01/2012 found a 1.3 cm irregular enhancing mass in the lower inner quadrant of the right breast, with no other masses of concern in either breast and no abnormal appearing lymph nodes.  The patient's subsequent history is as detailed below.   INTERVAL  HISTORY: [name] returns today accompanied by her husband [name] for followup of her right breast cancer . Since her last visit here she started her tamoxifen. She is having a variety of symptoms or complaints, some of which may be related to the tamoxifen, but the majority of which are simply going to be due to review general situation, namely to the upper-outer onset of menopause and the post traumatic stress problems experienced by so many of my patients when they reach this stage of their treatment. They did go on a cruise in January, which however prove to be less enjoyable than expected. She also had her Pap smear, which showed some inflammation (likely residual changes from chemotherapy) but no HPV. She also had a colonoscopy, which was benign.  REVIEW OF SYSTEMS: [name] is not sleeping well. Her mood changes are extreme, and she can be very angry and then tearful, all beyond reason, she tells me. She has a great deal of difficulty concentrating. At other times she will become obsessive about a detail, for example when they were supposed to be leaving for a trip she decided to clean the bathroom which took her 7 hours to accomplish. Luckily there were still able to get to make the departuret her time. She is having vaginal dryness problems and absolutely no libido. She anticipates dyspareunia. She is having in addition to all this shoulder pain which is being evaluated by Dr. Onnie Gonzalez. There appears to be some element of bursitis or calcific tendinitis there. She is anxious, depressed, and frustrated. A detailed review of systems was otherwise noncontributory  PAST MEDICAL HISTORY: Past Medical History  Diagnosis Date  . Asthma     as a child, early adult...none  now  . Recurrent upper respiratory infection (URI)     started 1/28.....getting better  . GERD (gastroesophageal reflux disease)     occas  at  night....twice a  month--otc meds  . Depression   . Anxiety   . Colonic mass 07/28/2011     ascending colon and found a polyp 2.5 cm adenoma - hemicoloectomy  . Breast cancer 10/28/12    right breast  . S/P chemotherapy, time since greater than 12 weeks   . BRCA negative 06/2013    BRCA I & II negative    PAST SURGICAL HISTORY: Past Surgical History  Procedure Laterality Date  . Hemicolectomy  07/28/11  . Breast biopsy Right 10/28/12    Invasive mammary ca, ER/PR=+, Her2 Neu-  . Appendectomy    . Tubal ligation  2006  . Endometrial ablation  2006    HTA    FAMILY HISTORY Family History  Problem Relation Age of Onset  . Heart disease Father   . Cancer Paternal Aunt     unknown form of cancer  . Lung cancer Maternal Grandmother   . Testicular cancer Cousin     paternal cousin; died in 76s  . Pancreatic cancer Cousin 22  . Colon cancer Other     paternal grandmother's sister  . Cancer Other     MGM's sister   the patient's father died from congestive far to failure at the age of 100. The patient's mother is still living, in her early 85s. The patient has one brother, 4 sisters. There is no history of breast or ovarian cancer in the family to the patient's knowledge.  GYNECOLOGIC HISTORY:  Menarche age 66, first live birth age 25. The patient's less. It was September 2013 and the one before that was March 2013. She is not taking hormone replacement. She did take oral contraceptives for several years intermittently in the past. There were no complications.   SOCIAL HISTORY:  [name] is originally from [place]. 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 She is not a counselor herself, but her husband, [name] (goes by "[name]") works as a Social worker for the [employer] as well as having his own clinic in [place]. He is originally from [place]. The patient's children are [name], who lives in [place] and has a Oceanographer in counseling; and [name], who is a Ship broker at [employer]. He lives with the  patient, and shares custody of his daughter, who is 16 years old and spends every other weekend at the patient's home.    ADVANCED DIRECTIVES: In place   HEALTH MAINTENANCE: History  Substance Use Topics  . Smoking status: Never Smoker   . Smokeless tobacco: Never Used  . Alcohol Use: Yes     Comment: wine occasionally     Colonoscopy: 06/02/2011 / [facility]  PAP:  Bone density:  Lipid panel:  Allergies  Allergen Reactions  . Morphine And Related Itching      Filed Vitals:   08/11/13 1532  BP: 103/57  Pulse: 82  Temp: 97.2 F (36.2 C)  Resp: 18     Body mass index is 21.88 kg/(m^2).    ECOG FS: 1 Filed Weights   08/11/13 1532  Weight: 123 lb 8 oz (56.019 kg)   OBJECTIVE: Middle-aged white woman who appears stated age Sclerae unicteric, pupils equal and reactive Oropharynx clear and moist, teeth in good repair No cervical or supraclavicular adenopathy Lungs clear to auscultation, good excursion bilaterally Heart regular  rate and rhythm Abdomen soft, nontender, positive bowel sounds.  MSK no focal spinal tenderness, no peripheral edema Neuro: nonfocal, well oriented, stressed affect Breasts: The right breast is status post lumpectomy and radiation. There is no evidence of local recurrence. The right axilla is benign. The left breast is unremarkable  LAB RESULTS:   Lab Results  Component Value Date   WBC 7.4 05/17/2013   NEUTROABS 5.3 05/17/2013   HGB 12.2 05/17/2013   HCT 39.2 05/17/2013   MCV 91.4 05/17/2013   PLT 291 05/17/2013      Chemistry      Component Value Date/Time   NA 144 05/17/2013 1555   NA 139 07/30/2011 0615   K 4.6 05/17/2013 1555   K 4.1 07/30/2011 0615   CL 102 11/03/2012 0821   CL 104 07/30/2011 0615   CO2 25 05/17/2013 1555   CO2 25 07/30/2011 0615   BUN 12.8 05/17/2013 1555   BUN 5* 07/30/2011 0615   CREATININE 1.0 05/17/2013 1555   CREATININE 0.68 07/30/2011 0615      Component Value Date/Time   CALCIUM 9.7 05/17/2013 1555   CALCIUM 8.8  07/30/2011 0615   ALKPHOS 67 05/17/2013 1555   ALKPHOS 56 07/22/2011 0845   AST 15 05/17/2013 1555   AST 14 07/22/2011 0845   ALT 16 05/17/2013 1555   ALT 14 07/22/2011 0845   BILITOT 0.20 05/17/2013 1555   BILITOT 0.3 07/22/2011 0845       STUDIES:  No results found.   ASSESSMENT: 53 y.o. 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    (1) s/p Right lumpectomy and sentinel lymph node sampling 11/17/2012 for a pT2 pN0, stage IIA invasive ductal carcinoma with prominent lobular features, grade 2, with initially positive  margins cleared intraoperatively; estrogen receptor positive with an Allred score of 7, progesterone receptor positive with an Allred score of 8, HercepTest 0, proliferation index by ACIS III 32% ([id number])   (2) Oncotype DX score of 33 predicting a risk of distant recurrence within 10 years of 23% if her only systemic treatment is tamoxifen for 5 years; the estrogen receptor was interpreted as negative on the Oncotype report  (3) adjuvant cyclophosphamide and docetaxel started 12/30/2012, first cycle given at [facility], with the remaining cycles given  at [facility] and completed 4th cycle 03/03/2013  (4)  adjuvant radiation therapy completed 05/10/2013  (5) started tamoxifen January 2015  PLAN:  [name] is doing well overall as far as her breast cancer is concerned, but there are multiple problems which are difficult it is intact.  First of all she is going through menopause. As we discussed, normal menopause includes hot flashes sleep alterations weight gain and mood changes, as well as bone loss and vaginal dryness, the latter of course racing all source of intimacy issues. Secondly she is on tamoxifen, which can worsen the hot flashes  problem but does not significantly increase any of the other issues just mentioned. In fact it actually helps the bowels.  Further while on tamoxifen she would be able to use vaginal estrogen creams to take care of the vaginal atrophy symptoms. There is loss of libido which is certainly related to tamoxifen and can also be related to her antidepressants. Antidepressants also can cause weight gain.  Finally there is posttraumatic stress, which I find is very common in my patients at this point. Namely, when the active treatment and, and the "easier" course of antiestrogen speak against, the patient is finally able to think back on what is happening and realizes that they're simply not going to be the same person they were before this entire process started.  I think [name]'s symptoms are what may be expected at this point. In my experience it takes about 3 years for patients to fully accept who they are now becoming. Of course everyone expect the patient to be "back to the way they were" because "you completed your treatments". That is unrealistic and it is not going to happen.  I hope our discussion today was helpful to [name] in terms of intellectually understanding where she is what we are going to do to try to make things better is to start gabapentin at bedtime which I think will help with his sleep problems. If the lamotrigine has to be changed for any reason, I would suggest going to venlafaxine, which has been demonstrated to be useful in terms of ameliorating hot flashes as well of course as being and mood stabilizer.  At this point to [name] is not interested in vaginal estrogens, but she knows this as well as are "pelvic health" program through physical therapy is available to her.  However the most important suggestion I made to her is to simply make herself go to the gym for 45 minutes 5 days a week for the next 6 weeks. She is aware that it takes about that long to "create a habit". I  think this would be the single healthiest intervention she could make at this point. Because we are in difficult position I have made her a return appointment with me in 2 months. If things have improved at that time I will start seeing her on a three-month basis thereafter.  She has very dense breasts, and I would obtain an MRI every year for the next 2 or 3 years, until (hopefully) her breasts become more fatty replaced through menopause and anti-estrogens. She is scheduled for mammography at [facility]. If there is any problem obtaining the MRI there they will let me know and we will try to get all that set up here in [place].  Chauncey Cruel, MD   08/11/2013 3:38 PM

## 2013-08-12 ENCOUNTER — Telehealth: Payer: Self-pay | Admitting: Oncology

## 2013-08-12 NOTE — Telephone Encounter (Signed)
s.w. pt and advised on April appt....pt ok and aware of appt.

## 2013-08-23 ENCOUNTER — Encounter: Payer: Self-pay | Admitting: Oncology

## 2013-08-23 ENCOUNTER — Encounter: Payer: Self-pay | Admitting: Radiation Oncology

## 2013-08-23 ENCOUNTER — Other Ambulatory Visit: Payer: Self-pay | Admitting: Oncology

## 2013-08-23 DIAGNOSIS — C50919 Malignant neoplasm of unspecified site of unspecified female breast: Secondary | ICD-10-CM

## 2013-08-24 ENCOUNTER — Ambulatory Visit (HOSPITAL_COMMUNITY)
Admission: RE | Admit: 2013-08-24 | Discharge: 2013-08-24 | Disposition: A | Discharge status: Home / Self Care | Payer: 59 | Source: Ambulatory Visit | Attending: Oncology | Admitting: Oncology

## 2013-08-24 ENCOUNTER — Other Ambulatory Visit: Payer: Self-pay | Admitting: Oncology

## 2013-08-24 ENCOUNTER — Telehealth: Payer: Self-pay | Admitting: *Deleted

## 2013-08-24 DIAGNOSIS — C50919 Malignant neoplasm of unspecified site of unspecified female breast: Secondary | ICD-10-CM | POA: Insufficient documentation

## 2013-08-24 DIAGNOSIS — M25559 Pain in unspecified hip: Secondary | ICD-10-CM | POA: Insufficient documentation

## 2013-08-24 NOTE — Telephone Encounter (Signed)
Called and notified patient that Dr Jana Hakim has reviewed hip xray, there are no signs of cancer or even arthritis, she may have strained or pulled a muscle. She should continue follow up with her Orthopedic MD. Patient verbalized much relief and understanding.

## 2013-08-25 ENCOUNTER — Encounter: Payer: Self-pay | Admitting: Oncology

## 2013-08-31 ENCOUNTER — Encounter: Payer: Self-pay | Admitting: Oncology

## 2013-09-01 ENCOUNTER — Telehealth: Payer: Self-pay | Admitting: *Deleted

## 2013-09-01 ENCOUNTER — Other Ambulatory Visit: Payer: Self-pay | Admitting: *Deleted

## 2013-09-01 NOTE — Telephone Encounter (Signed)
Update on meds

## 2013-09-15 ENCOUNTER — Ambulatory Visit: Payer: 59 | Admitting: Oncology

## 2013-09-20 ENCOUNTER — Encounter: Payer: Self-pay | Admitting: Oncology

## 2013-10-10 ENCOUNTER — Ambulatory Visit (HOSPITAL_BASED_OUTPATIENT_CLINIC_OR_DEPARTMENT_OTHER): Payer: 59 | Admitting: Oncology

## 2013-10-10 ENCOUNTER — Other Ambulatory Visit (HOSPITAL_BASED_OUTPATIENT_CLINIC_OR_DEPARTMENT_OTHER): Payer: 59

## 2013-10-10 VITALS — BP 109/72 | HR 85 | Temp 98.9°F | Resp 18 | Ht 63.0 in | Wt 121.0 lb

## 2013-10-10 DIAGNOSIS — C50319 Malignant neoplasm of lower-inner quadrant of unspecified female breast: Secondary | ICD-10-CM

## 2013-10-10 DIAGNOSIS — F329 Major depressive disorder, single episode, unspecified: Secondary | ICD-10-CM

## 2013-10-10 DIAGNOSIS — Z170 Estrogen receptor positive status [ER+]: Secondary | ICD-10-CM

## 2013-10-10 DIAGNOSIS — C50311 Malignant neoplasm of lower-inner quadrant of right female breast: Secondary | ICD-10-CM

## 2013-10-10 DIAGNOSIS — D649 Anemia, unspecified: Secondary | ICD-10-CM

## 2013-10-10 DIAGNOSIS — K219 Gastro-esophageal reflux disease without esophagitis: Secondary | ICD-10-CM

## 2013-10-10 DIAGNOSIS — K5900 Constipation, unspecified: Secondary | ICD-10-CM

## 2013-10-10 DIAGNOSIS — C50919 Malignant neoplasm of unspecified site of unspecified female breast: Secondary | ICD-10-CM

## 2013-10-10 DIAGNOSIS — F32A Depression, unspecified: Secondary | ICD-10-CM | POA: Insufficient documentation

## 2013-10-10 DIAGNOSIS — F3289 Other specified depressive episodes: Secondary | ICD-10-CM

## 2013-10-10 LAB — CBC WITH DIFFERENTIAL/PLATELET
BASO%: 1.2 % (ref 0.0–2.0)
Basophils Absolute: 0.1 10*3/uL (ref 0.0–0.1)
EOS%: 3.7 % (ref 0.0–7.0)
Eosinophils Absolute: 0.2 10*3/uL (ref 0.0–0.5)
HEMATOCRIT: 35.9 % (ref 34.8–46.6)
HEMOGLOBIN: 11.9 g/dL (ref 11.6–15.9)
LYMPH#: 1.4 10*3/uL (ref 0.9–3.3)
LYMPH%: 24.8 % (ref 14.0–49.7)
MCH: 29 pg (ref 25.1–34.0)
MCHC: 33.3 g/dL (ref 31.5–36.0)
MCV: 87.2 fL (ref 79.5–101.0)
MONO#: 0.4 10*3/uL (ref 0.1–0.9)
MONO%: 8 % (ref 0.0–14.0)
NEUT%: 62.3 % (ref 38.4–76.8)
NEUTROS ABS: 3.4 10*3/uL (ref 1.5–6.5)
Platelets: 305 10*3/uL (ref 145–400)
RBC: 4.11 10*6/uL (ref 3.70–5.45)
RDW: 13.5 % (ref 11.2–14.5)
WBC: 5.5 10*3/uL (ref 3.9–10.3)

## 2013-10-10 LAB — COMPREHENSIVE METABOLIC PANEL (CC13)
ALT: 10 U/L (ref 0–55)
ANION GAP: 9 meq/L (ref 3–11)
AST: 16 U/L (ref 5–34)
Albumin: 4 g/dL (ref 3.5–5.0)
Alkaline Phosphatase: 50 U/L (ref 40–150)
BUN: 13.9 mg/dL (ref 7.0–26.0)
CO2: 29 meq/L (ref 22–29)
CREATININE: 0.9 mg/dL (ref 0.6–1.1)
Calcium: 10.1 mg/dL (ref 8.4–10.4)
Chloride: 106 mEq/L (ref 98–109)
Glucose: 89 mg/dl (ref 70–140)
Potassium: 4.3 mEq/L (ref 3.5–5.1)
Sodium: 144 mEq/L (ref 136–145)
TOTAL PROTEIN: 7 g/dL (ref 6.4–8.3)
Total Bilirubin: 0.39 mg/dL (ref 0.20–1.20)

## 2013-10-10 NOTE — Progress Notes (Signed)
ID: [name] OB: Jul 19, 1960  MR#: [id number]  QQV#:[id number]  PCP: Elizabeth Naas, MD GYN:   [name]]; [name] OTHER MD: [name], [name], [name], [name]  HISTORY OF PRESENT ILLNESS: [name] had a routine colonoscopy under Dr. Paulita Gonzalez approximately a year and a half ago, showing a right colon lesion which could not be removed intraoperatively. CT scans of the abdomen and pelvis 07/07/2011 were negative except for the question of a soft tissue lesion in the right colon, and particularly there was no adenopathy or evidence of metastatic disease. She underwent partial colectomy under Dr. Madilyn Gonzalez 07/28/2011, and the pathology from that procedure ([id number]) showed a 3.2 cm tubular adenoma without high-grade dysplasia or malignancy. Margins were negative. 0 of 16 lymph nodes were involved.  And the patient has been working at weight loss and managed to lose approximately 25 pounds over the past year. She feels this is what allowed her to palpate a mass in her right breast earlier this month. (She had had negative mammographic screening 02/26/2012). On 10/27/2012 the patient underwent bilateral diagnostic mammography. The breasts are "extremely dense". Mammography did not show any new or worrisome finding. Right breast ultrasound however located an irregular hypoechoic mass in the area of the patient's palpable abnormality. It measured approximately 7 mm. Biopsy of this mass 10/28/2012 showed an invasive ductal carcinoma, grade 2, estrogen and progesterone receptor positive, with no HER-2 amplification, and an MIB-1 of 5%.  Bilateral breast MRI 11/01/2012 found a 1.3 cm irregular enhancing mass in the lower inner quadrant of the right breast, with no other masses of concern in either breast and no abnormal appearing lymph nodes.  The patient's subsequent history is as detailed below.   INTERVAL  HISTORY: [name] returns today accompanied by her husband [name] for followup of her right breast cancer . Since her last visit here she had right shoulder surgery. She has the arm in a sling. She is still rehabbing. In addition, she has become severely depressed. She is having significant complex at home with her children. She is "thinking of suicide" by stopping her tamoxifen and in fact she refuses to take tamoxifen at this point.  REVIEW OF SYSTEMS: [name] is having the expected amount of pain from a right shoulder surgery. She takes some narcotics for this at night. This does not constipate her because she takes MiraLAX. Because the arm is in a sling she is not able to do her housework and this has created quite a few problems with her 2 children, her daughter 48 and some 106, both of whom live in a house in according to her do not have the house chores. They do not even take care of their own mass, much less [name]. household chores like taking out the garbage. This is a significant cause of stress and negative feelings for this patient who culturally would prefer her children to stay in her home rather than mobile. There've been no unusual headaches, visual changes, nausea, vomiting, or dizziness. A detailed review of systems today was otherwise stable  PAST MEDICAL HISTORY: Past Medical History  Diagnosis Date  . Asthma     as a child, early adult...none now  . Recurrent upper respiratory infection (URI)     started 1/28.....getting better  . GERD (gastroesophageal reflux disease)     occas  at  night....twice a  month--otc meds  . Depression   . Anxiety   . Colonic  mass 07/28/2011    ascending colon and found a polyp 2.5 cm adenoma - hemicoloectomy  . Breast cancer 10/28/12    right breast  . S/P chemotherapy, time since greater than 12 weeks   . BRCA negative 06/2013    BRCA I & II negative    PAST SURGICAL HISTORY: Past Surgical History  Procedure Laterality Date  . Hemicolectomy   07/28/11  . Breast biopsy Right 10/28/12    Invasive mammary ca, ER/PR=+, Her2 Neu-  . Appendectomy    . Tubal ligation  2006  . Endometrial ablation  2006    HTA    FAMILY HISTORY Family History  Problem Relation Age of Onset  . Heart disease Father   . Cancer Paternal Aunt     unknown form of cancer  . Lung cancer Maternal Grandmother   . Testicular cancer Cousin     paternal cousin; died in 47s  . Pancreatic cancer Cousin 61  . Colon cancer Other     paternal grandmother's sister  . Cancer Other     MGM's sister   the patient's father died from congestive far to failure at the age of 46. The patient's mother is still living, in her early 9s. The patient has one brother, 4 sisters. There is no history of breast or ovarian cancer in the family to the patient's knowledge.  GYNECOLOGIC HISTORY:  Menarche age 37, first live birth age 30. The patient's less. It was September 2013 and the one before that was March 2013. She is not taking hormone replacement. She did take oral contraceptives for several years intermittently in the past. There were no complications.   SOCIAL HISTORY:  [name] is originally from [place]. 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 She is not a counselor herself, but her husband, [name] (goes by "[name]") works as a Social worker for the [employer] as well as having his own clinic in [place]. He is originally from [place]. The patient's children are [name], who lives in [place] and has a Oceanographer in counseling; and [name], who is a Ship broker at [employer]. He lives with the patient, and shares custody of his daughter, who is 60 years old and spends every other weekend at the patient's home.    ADVANCED DIRECTIVES: In place   HEALTH MAINTENANCE: History  Substance Use Topics  . Smoking status: Never Smoker   . Smokeless tobacco: Never Used  . Alcohol Use: Yes     Comment: wine  occasionally     Colonoscopy: 06/02/2011 / [facility]  PAP:  Bone density:  Lipid panel:  Allergies  Allergen Reactions  . Morphine And Related Itching      Filed Vitals:   10/10/13 1613  BP: 109/72  Pulse: 85  Temp: 98.9 F (37.2 C)  Resp: 18     Body mass index is 21.44 kg/(m^2).    ECOG FS: 1 Filed Weights   10/10/13 1613  Weight: 121 lb (54.885 kg)   OBJECTIVE: Middle-aged white woman who appears depressed Exam was otherwise deferred  LAB RESULTS:   Lab Results  Component Value Date   WBC 5.5 10/10/2013   NEUTROABS 3.4 10/10/2013   HGB 11.9 10/10/2013   HCT 35.9 10/10/2013   MCV 87.2 10/10/2013   PLT 305 10/10/2013      Chemistry      Component Value Date/Time   NA 144 10/10/2013 1558   NA 139 07/30/2011 0615   K 4.3 10/10/2013  1558   K 4.1 07/30/2011 0615   CL 102 11/03/2012 0821   CL 104 07/30/2011 0615   CO2 29 10/10/2013 1558   CO2 25 07/30/2011 0615   BUN 13.9 10/10/2013 1558   BUN 5* 07/30/2011 0615   CREATININE 0.9 10/10/2013 1558   CREATININE 0.68 07/30/2011 0615      Component Value Date/Time   CALCIUM 10.1 10/10/2013 1558   CALCIUM 8.8 07/30/2011 0615   ALKPHOS 50 10/10/2013 1558   ALKPHOS 56 07/22/2011 0845   AST 16 10/10/2013 1558   AST 14 07/22/2011 0845   ALT 10 10/10/2013 1558   ALT 14 07/22/2011 0845   BILITOT 0.39 10/10/2013 1558   BILITOT 0.3 07/22/2011 0845       STUDIES: No results found.  ASSESSMENT: 53 y.o. [name] woman status post right breast lower inner quadrant biopsy 10/28/2012 for a clinical T1c N0, stage IA invasive ductal carcinoma, grade 2, estrogen and progesterone receptor positive, HER-2 not amplified, with an MIB-1 of 20%  (0) Pansie's genetics show a VUS, namely TP53 c.868C>T.    (1) s/p Right lumpectomy and sentinel lymph node sampling 11/17/2012 for a pT2 pN0, stage IIA invasive ductal carcinoma with prominent lobular features, grade 2, with initially positive  margins cleared intraoperatively; estrogen receptor positive with  an Allred score of 7, progesterone receptor positive with an Allred score of 8, HercepTest 0, proliferation index by ACIS III 32% ([id number])   (2) Oncotype DX score of 33 predicting a risk of distant recurrence within 10 years of 23% if her only systemic treatment is tamoxifen for 5 years; the estrogen receptor was interpreted as negative on the Oncotype report  (3) adjuvant cyclophosphamide and docetaxel started 12/30/2012, first cycle given at [facility], with the remaining cycles given  at [facility] and completed 4th cycle 03/03/2013  (4)  adjuvant radiation therapy completed 05/10/2013  (5) started tamoxifen January 2015  PLAN:  Naijah is clearly depressed. We went over her situation for more than 45 minutes today, with [name] in the room as well. She is "thinking of suicide", by which she means not taking tamoxifen. She does not have any other plan in mind. She thinks tamoxifen is what is causing her depression.  She was very stable on Wellbutrin previously. Her insurance company apparently sent a note stating that Wellbutrin should not be taken with tamoxifen and that's why her antidepressants were changed. She is clearly not benefiting from the current one.  To date of an interaction between tamoxifen and Wellbutrin is misleading. There is indeed an interaction in terms of metabolism of tamoxifen. However the clinical effect of tamoxifen is not affected, and a careful review of the women in the ATAC trial, those on tamoxifen with took Wellbutrin during the trial did just as well as those who did not take any antidepressants as far as breast cancer recurrence is concerned.  Furthermore the STAR trial showed the same (low) percentage of depressed patients as those on placebo.  Accordingly I think we can conclude that tamoxifen does not cause depression, and that there is no clinically significant interaction between Wellbutrin and tamoxifen. It is also true that stopping tamoxifen for one or 2  months will not make a significant difference to her long-term prognosis.  She is meeting tomorrow with her psychiatrist, Dr. Jamse Gonzalez. They will decide whether to go back to Wellbutrin or try something else like venlafaxine. I am comfortable with either of those options. The real important thing right now is to  get Hadar out of her depressed state.  She is going to see me again in a month. She will have a repeat mammogram and breast MRI prior to that visit. At that time we will see how she is doing from a moot point of view and decide whether she wants to go back on tamoxifen at that time or try a different antiestrogen.  She knows that we'll be glad to see him a short notice as needed. She really has an appointment with Dr. Jamse Gonzalez tomorrow and her depression can best be addressed at that time.  Chauncey Cruel, MD   10/10/2013 5:47 PM

## 2013-10-11 NOTE — Addendum Note (Signed)
Addended by: Laureen Abrahams on: 10/11/2013 11:25 AM   Modules accepted: Orders, Medications

## 2013-10-12 ENCOUNTER — Telehealth: Payer: Self-pay | Admitting: Oncology

## 2013-10-12 NOTE — Telephone Encounter (Signed)
, °

## 2013-10-14 ENCOUNTER — Encounter: Payer: Self-pay | Admitting: Oncology

## 2013-10-18 ENCOUNTER — Other Ambulatory Visit: Payer: Self-pay | Admitting: *Deleted

## 2013-10-18 DIAGNOSIS — C50311 Malignant neoplasm of lower-inner quadrant of right female breast: Secondary | ICD-10-CM

## 2013-10-18 DIAGNOSIS — R922 Inconclusive mammogram: Secondary | ICD-10-CM

## 2013-10-21 ENCOUNTER — Other Ambulatory Visit: Payer: Self-pay | Admitting: *Deleted

## 2013-10-21 DIAGNOSIS — C50311 Malignant neoplasm of lower-inner quadrant of right female breast: Secondary | ICD-10-CM

## 2013-10-27 ENCOUNTER — Other Ambulatory Visit: Payer: Self-pay | Admitting: *Deleted

## 2013-11-09 ENCOUNTER — Ambulatory Visit
Admission: RE | Admit: 2013-11-09 | Discharge: 2013-11-09 | Disposition: A | Discharge status: Home / Self Care | Payer: 59 | Source: Ambulatory Visit | Attending: Oncology | Admitting: Oncology

## 2013-11-09 DIAGNOSIS — C50311 Malignant neoplasm of lower-inner quadrant of right female breast: Secondary | ICD-10-CM

## 2013-11-09 MED ORDER — GADOBENATE DIMEGLUMINE 529 MG/ML IV SOLN
11.0000 mL | Freq: Once | INTRAVENOUS | Status: AC | PRN
  Administered 2013-11-09: 11 mL via INTRAVENOUS

## 2013-11-11 ENCOUNTER — Other Ambulatory Visit (HOSPITAL_BASED_OUTPATIENT_CLINIC_OR_DEPARTMENT_OTHER): Payer: 59

## 2013-11-11 ENCOUNTER — Ambulatory Visit (HOSPITAL_BASED_OUTPATIENT_CLINIC_OR_DEPARTMENT_OTHER): Payer: 59 | Admitting: Oncology

## 2013-11-11 ENCOUNTER — Telehealth: Payer: Self-pay | Admitting: Oncology

## 2013-11-11 VITALS — BP 106/70 | HR 87 | Temp 98.1°F | Resp 18 | Ht 63.0 in | Wt 118.6 lb

## 2013-11-11 DIAGNOSIS — F3289 Other specified depressive episodes: Secondary | ICD-10-CM

## 2013-11-11 DIAGNOSIS — F329 Major depressive disorder, single episode, unspecified: Secondary | ICD-10-CM

## 2013-11-11 DIAGNOSIS — Z170 Estrogen receptor positive status [ER+]: Secondary | ICD-10-CM

## 2013-11-11 DIAGNOSIS — F32A Depression, unspecified: Secondary | ICD-10-CM

## 2013-11-11 DIAGNOSIS — C50319 Malignant neoplasm of lower-inner quadrant of unspecified female breast: Secondary | ICD-10-CM

## 2013-11-11 DIAGNOSIS — C50311 Malignant neoplasm of lower-inner quadrant of right female breast: Secondary | ICD-10-CM

## 2013-11-11 LAB — COMPREHENSIVE METABOLIC PANEL (CC13)
ALBUMIN: 4 g/dL (ref 3.5–5.0)
ALK PHOS: 58 U/L (ref 40–150)
ALT: 15 U/L (ref 0–55)
AST: 16 U/L (ref 5–34)
Anion Gap: 12 mEq/L — ABNORMAL HIGH (ref 3–11)
BUN: 17.5 mg/dL (ref 7.0–26.0)
CALCIUM: 9.6 mg/dL (ref 8.4–10.4)
CO2: 22 mEq/L (ref 22–29)
Chloride: 108 mEq/L (ref 98–109)
Creatinine: 0.8 mg/dL (ref 0.6–1.1)
Glucose: 101 mg/dl (ref 70–140)
POTASSIUM: 4.8 meq/L (ref 3.5–5.1)
SODIUM: 142 meq/L (ref 136–145)
TOTAL PROTEIN: 6.6 g/dL (ref 6.4–8.3)
Total Bilirubin: 0.39 mg/dL (ref 0.20–1.20)

## 2013-11-11 LAB — CBC WITH DIFFERENTIAL/PLATELET
BASO%: 1 % (ref 0.0–2.0)
BASOS ABS: 0.1 10*3/uL (ref 0.0–0.1)
EOS ABS: 0.5 10*3/uL (ref 0.0–0.5)
EOS%: 9.1 % — ABNORMAL HIGH (ref 0.0–7.0)
HCT: 35.5 % (ref 34.8–46.6)
HGB: 11.7 g/dL (ref 11.6–15.9)
LYMPH#: 1.4 10*3/uL (ref 0.9–3.3)
LYMPH%: 25 % (ref 14.0–49.7)
MCH: 28.9 pg (ref 25.1–34.0)
MCHC: 33 g/dL (ref 31.5–36.0)
MCV: 87.6 fL (ref 79.5–101.0)
MONO#: 0.4 10*3/uL (ref 0.1–0.9)
MONO%: 6.8 % (ref 0.0–14.0)
NEUT%: 58.1 % (ref 38.4–76.8)
NEUTROS ABS: 3.3 10*3/uL (ref 1.5–6.5)
Platelets: 260 10*3/uL (ref 145–400)
RBC: 4.05 10*6/uL (ref 3.70–5.45)
RDW: 13.4 % (ref 11.2–14.5)
WBC: 5.7 10*3/uL (ref 3.9–10.3)

## 2013-11-11 MED ORDER — ANASTROZOLE 1 MG PO TABS: 1.0000 mg | ORAL_TABLET | Freq: Every day | ORAL | Status: DC

## 2013-11-11 NOTE — Progress Notes (Signed)
ID: [name] OB: 1960/11/02  MR#: [id number]  ALP#:[id number]  PCP: Elizabeth Naas, MD GYN:   [name]]; [name] OTHER MD: [name], [name], [name], [name]  HISTORY OF PRESENT ILLNESS: From the original intake node:  [name] had a routine colonoscopy under Dr. Paulita Gonzalez approximately a year and a half ago, showing a right colon lesion which could not be removed intraoperatively. CT scans of the abdomen and pelvis 07/07/2011 were negative except for the question of a soft tissue lesion in the right colon, and particularly there was no adenopathy or evidence of metastatic disease. She underwent partial colectomy under Dr. Madilyn Gonzalez Gonzalez, and the pathology from that procedure ([id number]) showed a 3.2 cm tubular adenoma without high-grade dysplasia or malignancy. Margins were negative. 0 of 16 lymph nodes were involved.  And the patient has been working at weight loss and managed to lose approximately 25 pounds over the past year. She feels this is what allowed her to palpate a mass in her right breast earlier this month. (She had had negative mammographic screening 02/26/2012). On 10/27/2012 the patient underwent bilateral diagnostic mammography. The breasts are "extremely dense". Mammography did not show any new or worrisome finding. Right breast ultrasound however located an irregular hypoechoic mass in the area of the patient's palpable abnormality. It measured approximately 7 mm. Biopsy of this mass 10/28/2012 showed an invasive ductal carcinoma, grade 2, estrogen and progesterone receptor positive, with no HER-2 amplification, and an MIB-1 of 5%.  Bilateral breast MRI 11/01/2012 found a 1.3 cm irregular enhancing mass in the lower inner quadrant of the right breast, with no other masses of concern in either breast and no abnormal appearing lymph nodes.  The patient's subsequent history is as  detailed below.   INTERVAL HISTORY: [name] returns today accompanied by her husband [name] for followup of her right breast cancer. Since her last visit here she has been receiving antidepressants and undergoing counseling. Her Effexor dose is done and 50 mg daily and she takes Klonopin perhaps twice a day at most. She is doing "much better". She is no longer "suicidal". She is willing to consider antiestrogen therapy again, although not tamoxifen specifically. She is here today to discuss further consideration of antiestrogen therapy.  REVIEW OF SYSTEMS: [name] is sleeping better. She says Naprosyn helps in that regard. She is going through physical therapy he still for her shoulder rehabilitation. That can be painful. Her weight is still too low, without and 24 pounds being her goal. She does have some night sweats and hot flashes although these are diminished. She is tired. It affects her daily activities. She continues to have pain particularly in the shoulder. She still has some accommodation problems. Her appetite is poor. She takes a milkshake every once in a while. She has some bleeding from hemorrhoids. Her overactive bladder is not any better despite the medication she is taking. Her joints and back pains are not more persistent or intense than prior. She admits to anxiety and depression but is clearly better. She has problems concentrating. It is very hard to complete a task. She obsesses about minor details and loses the third of which is supposed to be doing. A detailed review of systems today is otherwise noncontributory  PAST MEDICAL HISTORY: Past Medical History  Diagnosis Date  . Asthma     as a child, early adult...none now  . Recurrent upper respiratory infection (URI)  started 1/28.....getting better  . GERD (gastroesophageal reflux disease)     occas  at  night....twice a  month--otc meds  . Depression   . Anxiety   . Colonic mass 07/28/2011    ascending colon and found  a polyp 2.5 cm adenoma - hemicoloectomy  . Breast cancer 10/28/12    right breast  . S/P chemotherapy, time since greater than 12 weeks   . BRCA negative 06/2013    BRCA I & II negative    PAST SURGICAL HISTORY: Past Surgical History  Procedure Laterality Date  . Hemicolectomy  07/28/11  . Breast biopsy Right 10/28/12    Invasive mammary ca, ER/PR=+, Her2 Neu-  . Appendectomy    . Tubal ligation  2006  . Endometrial ablation  2006    HTA    FAMILY HISTORY Family History  Problem Relation Age of Onset  . Heart disease Father   . Cancer Paternal Aunt     unknown form of cancer  . Lung cancer Maternal Grandmother   . Testicular cancer Cousin     paternal cousin; died in 27s  . Pancreatic cancer Cousin 59  . Colon cancer Other     paternal grandmother's sister  . Cancer Other     MGM's sister   the patient's father died from congestive far to failure at the age of 28. The patient's mother is still living, in her early 71s. The patient has one brother, 4 sisters. There is no history of breast or ovarian cancer in the family to the patient's knowledge.  GYNECOLOGIC HISTORY:  Menarche age 53, first live birth age 78. The patient's less. It was September 2013 and the one before that was March 2013. She is not taking hormone replacement. She did take oral contraceptives for several years intermittently in the past. There were no complications.   SOCIAL HISTORY:  [name] is originally from [place]. 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 He is originally from [place]. The patient's children are [name], who lives in [place] and has a Oceanographer in counseling; and [name], who is a Ship broker at [employer]. He lives with the patient, and shares custody  of his daughter, who is 59 years old and spends every other weekend at the patient's home.    ADVANCED DIRECTIVES: In place   HEALTH MAINTENANCE: History  Substance Use Topics  . Smoking status: Never Smoker   . Smokeless tobacco: Never Used  . Alcohol Use: Yes     Comment: wine occasionally     Colonoscopy: 06/02/2011 / [facility]  PAP:  Bone density:  Lipid panel:  Allergies  Allergen Reactions  . Morphine And Related Itching      Filed Vitals:   11/11/13 1014  BP: 106/70  Pulse: 87  Temp: 98.1 F (36.7 C)  Resp: 18     Body mass index is 21.01 kg/(m^2).    ECOG FS: 1 Filed Weights   11/11/13 1014  Weight: 118 lb 9.6 oz (53.797 kg)   OBJECTIVE: Middle-aged white woman in no acute distress  Sclerae unicteric, pupils equal and reactive Oropharynx clear and moist-- teeth in good repair No cervical or supraclavicular adenopathy Lungs no rales or rhonchi Heart regular rate and rhythm Abd soft, nontender, positive bowel sounds MSK no focal spinal  tenderness, no upper extremity lymphedema Neuro: nonfocal, well oriented, appropriate affect Breasts: The right breast is status post lumpectomy and radiation. There is some information of the normal silhouette, but no evidence of local recurrence and no skin or nipple changes of concern. The right axilla is benign. The left breast is unremarkable   LAB RESULTS:   Lab Results  Component Value Date   WBC 5.7 11/11/2013   NEUTROABS 3.3 11/11/2013   HGB 11.7 11/11/2013   HCT 35.5 11/11/2013   MCV 87.6 11/11/2013   PLT 260 11/11/2013      Chemistry      Component Value Date/Time   NA 144 10/10/2013 1558   NA 139 07/30/2011 0615   K 4.3 10/10/2013 1558   K 4.1 07/30/2011 0615   CL 102 11/03/2012 0821   CL 104 07/30/2011 0615   CO2 29 10/10/2013 1558   CO2 25 07/30/2011 0615   BUN 13.9 10/10/2013 1558   BUN 5* 07/30/2011 0615   CREATININE 0.9 10/10/2013 1558   CREATININE 0.68 07/30/2011 0615      Component Value Date/Time    CALCIUM 10.1 10/10/2013 1558   CALCIUM 8.8 07/30/2011 0615   ALKPHOS 50 10/10/2013 1558   ALKPHOS 56 07/22/2011 0845   AST 16 10/10/2013 1558   AST 14 07/22/2011 0845   ALT 10 10/10/2013 1558   ALT 14 07/22/2011 0845   BILITOT 0.39 10/10/2013 1558   BILITOT 0.3 07/22/2011 0845       STUDIES: Mr Breast Bilateral W Wo Contrast  11/11/2013   CLINICAL DATA:  Status post lumpectomy in 2014 for invasive breast carcinoma lower inner quadrant right breast, lifetime risk of over 20% ; extremely dense breast tissue mammographically  LABS:  n/a  EXAM: BILATERAL BREAST MRI WITH AND WITHOUT CONTRAST  TECHNIQUE: Multiplanar, multisequence MR images of both breasts were obtained prior to and following the intravenous administration of 46ml of MultiHance.  THREE-DIMENSIONAL MR IMAGE RENDERING ON INDEPENDENT WORKSTATION:  Three-dimensional MR images were rendered by post-processing of the original MR data on an independent workstation. The three-dimensional MR images were interpreted, and findings are reported in the following complete MRI report for this study. Three dimensional images were evaluated at the independent DynaCad workstation  COMPARISON:  11/08/2013 and 02/26/2012 mammograms, 10/1912 MRI  FINDINGS: Breast composition: d.  Extreme fibroglandular tissue.  Background parenchymal enhancement: Minimal  Right breast: There are a few foci of susceptibility artifact postoperatively in the right axilla. There is postsurgical change in the lower inner quadrant of the right breast posteriorly in the area of prior mass. There is no a 20 x 13 x 23 mm postoperative seroma with thinned min enhancement in this area. There are no other areas of focal abnormality.  Left breast: No mass or abnormal enhancement.  Lymph nodes: No abnormal appearing lymph nodes.  Ancillary findings:  None.  IMPRESSION: Postoperative change in the right axilla and right breast. No suspicious findings.  RECOMMENDATION: Annual screening mammography and MRI  based on risk, personal history, and breast density.  BI-RADS CATEGORY  2: Benign.   Electronically Signed   By: Skipper Cliche M.D.   On: 11/11/2013 11:55    ASSESSMENT: 53 y.o. [place] woman status post right breast lower inner quadrant biopsy 10/28/2012 for a clinical T1c N0, stage IA invasive ductal carcinoma, grade 2, estrogen and progesterone receptor positive, HER-2 not amplified, with an MIB-1 of 20%  (0) Althia's genetics show a VUS, namely TP53 c.868C>T.    (1) s/p  Right lumpectomy and sentinel lymph node sampling 11/17/2012 for a pT2 pN0, stage IIA invasive ductal carcinoma with prominent lobular features, grade 2, with initially positive  margins cleared intraoperatively; estrogen receptor positive with an Allred score of 7, progesterone receptor positive with an Allred score of 8, HercepTest 0, proliferation index by ACIS III 32% ([id number])   (2) Oncotype DX score of 33 predicting a risk of distant recurrence within 10 years of 23% if her only systemic treatment is tamoxifen for 5 years; the estrogen receptor was interpreted as negative on the Oncotype report  (3) adjuvant cyclophosphamide and docetaxel started 12/30/2012, first cycle given at [facility], with the remaining cycles given  at [facility] and completed 4th cycle 03/03/2013  (4)  adjuvant radiation therapy completed 05/10/2013  (5) started tamoxifen January 2015, stopped March 2015 due to depression  (6) anastrozole started 11/12/2013  PLAN:  Jourdan is doing much better from a psychological point of view. She does still have some mental fogginess probably due to her earlier chemotherapy. This in addition to some of her medications and the recent shoulder surgery are making it difficult to get through the working day. It is not clear whether she will be able to continue to work.  In terms of antiestrogen as she is willing to try an aromatase inhibitor. She has not had a period since the start of her chemotherapy. The  periods have not resumed even though she stopped tamoxifen more than 2 months ago. She may well be permanently menopausal at this point, but I am going to check an [facility] and LH as well as estradiol level prior to her visit here late July.  I quoted her a risk of recurrence in the 10% range if she took anastrozole for 5 years. This is not based on her Oncotype but on the adjuvant! Online data base. Using the same database, her risk of dying from this cancer within the next 10 years would be less than 5%, although we did not discuss the mortality data.  She has a good understanding of the possible toxicities, side effects and complications of anastrozole. She agrees with the overall plan. She knows the goal of treatment in her case is cure. She will call with any problems that may develop before her next visit here.  Chauncey Cruel, MD   11/11/2013 10:30 AM

## 2013-11-11 NOTE — Telephone Encounter (Signed)
per GM to sch appt-gave pt copy of sch

## 2013-11-23 ENCOUNTER — Encounter: Payer: Self-pay | Admitting: Oncology

## 2013-11-24 NOTE — Telephone Encounter (Signed)
Message printed and taken to prescriber for review.

## 2013-11-25 ENCOUNTER — Other Ambulatory Visit: Payer: Self-pay | Admitting: *Deleted

## 2013-11-25 MED ORDER — NAPROXEN 500 MG PO TABS: 500.0000 mg | ORAL_TABLET | Freq: Two times a day (BID) | ORAL | Status: DC

## 2013-12-30 ENCOUNTER — Ambulatory Visit (HOSPITAL_BASED_OUTPATIENT_CLINIC_OR_DEPARTMENT_OTHER): Payer: 59

## 2013-12-30 ENCOUNTER — Other Ambulatory Visit: Payer: 59

## 2013-12-30 DIAGNOSIS — F32A Depression, unspecified: Secondary | ICD-10-CM

## 2013-12-30 DIAGNOSIS — C50319 Malignant neoplasm of lower-inner quadrant of unspecified female breast: Secondary | ICD-10-CM

## 2013-12-30 DIAGNOSIS — F329 Major depressive disorder, single episode, unspecified: Secondary | ICD-10-CM

## 2013-12-30 DIAGNOSIS — C50311 Malignant neoplasm of lower-inner quadrant of right female breast: Secondary | ICD-10-CM

## 2013-12-30 LAB — COMPREHENSIVE METABOLIC PANEL (CC13)
ALT: 16 U/L (ref 0–55)
ANION GAP: 8 meq/L (ref 3–11)
AST: 16 U/L (ref 5–34)
Albumin: 4.2 g/dL (ref 3.5–5.0)
Alkaline Phosphatase: 63 U/L (ref 40–150)
BILIRUBIN TOTAL: 0.26 mg/dL (ref 0.20–1.20)
BUN: 19 mg/dL (ref 7.0–26.0)
CHLORIDE: 106 meq/L (ref 98–109)
CO2: 27 mEq/L (ref 22–29)
Calcium: 9.9 mg/dL (ref 8.4–10.4)
Creatinine: 0.8 mg/dL (ref 0.6–1.1)
GLUCOSE: 93 mg/dL (ref 70–140)
Potassium: 4.2 mEq/L (ref 3.5–5.1)
SODIUM: 141 meq/L (ref 136–145)
TOTAL PROTEIN: 7.3 g/dL (ref 6.4–8.3)

## 2013-12-30 LAB — CBC WITH DIFFERENTIAL/PLATELET
BASO%: 0.9 % (ref 0.0–2.0)
Basophils Absolute: 0 10*3/uL (ref 0.0–0.1)
EOS%: 9.9 % — ABNORMAL HIGH (ref 0.0–7.0)
Eosinophils Absolute: 0.5 10*3/uL (ref 0.0–0.5)
HCT: 37.5 % (ref 34.8–46.6)
HGB: 12.1 g/dL (ref 11.6–15.9)
LYMPH%: 28.9 % (ref 14.0–49.7)
MCH: 27.9 pg (ref 25.1–34.0)
MCHC: 32.3 g/dL (ref 31.5–36.0)
MCV: 86.6 fL (ref 79.5–101.0)
MONO#: 0.4 10*3/uL (ref 0.1–0.9)
MONO%: 8 % (ref 0.0–14.0)
NEUT#: 2.4 10*3/uL (ref 1.5–6.5)
NEUT%: 52.3 % (ref 38.4–76.8)
Platelets: 259 10*3/uL (ref 145–400)
RBC: 4.33 10*6/uL (ref 3.70–5.45)
RDW: 13.7 % (ref 11.2–14.5)
WBC: 4.6 10*3/uL (ref 3.9–10.3)
lymph#: 1.3 10*3/uL (ref 0.9–3.3)

## 2013-12-30 LAB — FOLLICLE STIMULATING HORMONE: FSH: 125.7 m[IU]/mL — AB

## 2013-12-30 LAB — LUTEINIZING HORMONE: LH: 61.2 m[IU]/mL

## 2014-01-05 LAB — ESTRADIOL, ULTRA SENS: Estradiol, Ultra Sensitive: <2 pg/mL

## 2014-01-12 ENCOUNTER — Other Ambulatory Visit: Payer: Self-pay | Admitting: *Deleted

## 2014-01-12 DIAGNOSIS — C50311 Malignant neoplasm of lower-inner quadrant of right female breast: Secondary | ICD-10-CM

## 2014-01-13 ENCOUNTER — Ambulatory Visit (HOSPITAL_BASED_OUTPATIENT_CLINIC_OR_DEPARTMENT_OTHER): Payer: 59 | Admitting: Oncology

## 2014-01-13 ENCOUNTER — Telehealth: Payer: Self-pay | Admitting: Oncology

## 2014-01-13 ENCOUNTER — Other Ambulatory Visit (HOSPITAL_BASED_OUTPATIENT_CLINIC_OR_DEPARTMENT_OTHER): Payer: 59

## 2014-01-13 VITALS — BP 95/40 | HR 73 | Temp 98.2°F | Resp 18 | Ht 63.0 in | Wt 115.6 lb

## 2014-01-13 DIAGNOSIS — F32A Depression, unspecified: Secondary | ICD-10-CM

## 2014-01-13 DIAGNOSIS — C50311 Malignant neoplasm of lower-inner quadrant of right female breast: Secondary | ICD-10-CM

## 2014-01-13 DIAGNOSIS — C50319 Malignant neoplasm of lower-inner quadrant of unspecified female breast: Secondary | ICD-10-CM

## 2014-01-13 DIAGNOSIS — F329 Major depressive disorder, single episode, unspecified: Secondary | ICD-10-CM

## 2014-01-13 DIAGNOSIS — Z170 Estrogen receptor positive status [ER+]: Secondary | ICD-10-CM

## 2014-01-13 DIAGNOSIS — F3289 Other specified depressive episodes: Secondary | ICD-10-CM

## 2014-01-13 LAB — CBC WITH DIFFERENTIAL/PLATELET
BASO%: 0.3 % (ref 0.0–2.0)
Basophils Absolute: 0 10*3/uL (ref 0.0–0.1)
EOS%: 8.4 % — AB (ref 0.0–7.0)
Eosinophils Absolute: 0.5 10*3/uL (ref 0.0–0.5)
HCT: 36.3 % (ref 34.8–46.6)
HGB: 11.8 g/dL (ref 11.6–15.9)
LYMPH%: 27.9 % (ref 14.0–49.7)
MCH: 28.3 pg (ref 25.1–34.0)
MCHC: 32.6 g/dL (ref 31.5–36.0)
MCV: 86.8 fL (ref 79.5–101.0)
MONO#: 0.5 10*3/uL (ref 0.1–0.9)
MONO%: 8 % (ref 0.0–14.0)
NEUT#: 3.2 10*3/uL (ref 1.5–6.5)
NEUT%: 55.4 % (ref 38.4–76.8)
Platelets: 288 10*3/uL (ref 145–400)
RBC: 4.18 10*6/uL (ref 3.70–5.45)
RDW: 14.1 % (ref 11.2–14.5)
WBC: 5.8 10*3/uL (ref 3.9–10.3)
lymph#: 1.6 10*3/uL (ref 0.9–3.3)

## 2014-01-13 LAB — COMPREHENSIVE METABOLIC PANEL (CC13)
ALK PHOS: 61 U/L (ref 40–150)
ALT: 14 U/L (ref 0–55)
AST: 15 U/L (ref 5–34)
Albumin: 4 g/dL (ref 3.5–5.0)
Anion Gap: 10 mEq/L (ref 3–11)
BILIRUBIN TOTAL: 0.34 mg/dL (ref 0.20–1.20)
BUN: 16.7 mg/dL (ref 7.0–26.0)
CO2: 27 mEq/L (ref 22–29)
Calcium: 9.3 mg/dL (ref 8.4–10.4)
Chloride: 105 mEq/L (ref 98–109)
Creatinine: 0.8 mg/dL (ref 0.6–1.1)
Glucose: 87 mg/dl (ref 70–140)
Potassium: 3.8 mEq/L (ref 3.5–5.1)
SODIUM: 142 meq/L (ref 136–145)
TOTAL PROTEIN: 6.8 g/dL (ref 6.4–8.3)

## 2014-01-13 NOTE — Telephone Encounter (Signed)
, °

## 2014-01-13 NOTE — Progress Notes (Signed)
ID: [name] OB: 1961-03-28  MR#: [id number]  EHU#:[id number]  PCP: Elizabeth Naas, MD GYN:   SURenelda Loma Ingram]; Isla Pence OTHER MD: Christene Slates, Thea Silversmith, Arta Silence, Elspeth Cho Vergil  HISTORY OF PRESENT ILLNESS: From the original intake node:  [name] had a routine colonoscopy under Dr. Paulita Fujita approximately a year and a half ago, showing a right colon lesion which could not be removed intraoperatively. CT scans of the abdomen and pelvis 07/07/2011 were negative except for the question of a soft tissue lesion in the right colon, and particularly there was no adenopathy or evidence of metastatic disease. She underwent partial colectomy under Dr. Madilyn Hook 07/28/2011, and the pathology from that procedure ([id number]) showed a 3.2 cm tubular adenoma without high-grade dysplasia or malignancy. Margins were negative. 0 of 16 lymph nodes were involved.  And the patient has been working at weight loss and managed to lose approximately 25 pounds over the past year. She feels this is what allowed her to palpate a mass in her right breast earlier this month. (She had had negative mammographic screening 02/26/2012). On 10/27/2012 the patient underwent bilateral diagnostic mammography. The breasts are "extremely dense". Mammography did not show any new or worrisome finding. Right breast ultrasound however located an irregular hypoechoic mass in the area of the patient's palpable abnormality. It measured approximately 7 mm. Biopsy of this mass 10/28/2012 showed an invasive ductal carcinoma, grade 2, estrogen and progesterone receptor positive, with no HER-2 amplification, and an MIB-1 of 5%.  Bilateral breast MRI 11/01/2012 found a 1.3 cm irregular enhancing mass in the lower inner quadrant of the right breast, with no other masses of concern in either breast and no abnormal appearing lymph nodes.  The patient's subsequent history is as  detailed below.   INTERVAL HISTORY: [name] returns today accompanied by her husband [name] for followup of her right breast cancer. The interval history is somewhat chaotic. She continues to work full-time, but finds it work very stressful. She wonders if she should go on disability for 3-6 months. She describes herself as irritable, talking to fast, interrupting people, and going on a "buying mania". Because of these hypomanic symptoms she took herself off the Adderall and cut back on her venlafaxine. She is working with Dr. Jamse Belfast regarding these issues. She is tolerating the anastrozole well.  REVIEW OF SYSTEMS: [name] describes herself is severely fatigued. She has pain in her joints and some 7 particularly the right shoulder. This is dropping and intermittent. She has had some mouth sores, but these resolved. Her appetite is poor. She is losing weight. She bruises easily, is very forgetful, and admits to depression. She is working 12-16 hour days he says. Hot flashes are not severe. She is not exercising at all at present. A detailed review of systems was otherwise stable  PAST MEDICAL HISTORY: Past Medical History  Diagnosis Date  . Asthma     as a child, early adult...none now  . Recurrent upper respiratory infection (URI)     started 1/28.....getting better  . GERD (gastroesophageal reflux disease)     occas  at  night....twice a  month--otc meds  . Depression   . Anxiety   . Colonic mass 07/28/2011    ascending colon and found a polyp 2.5 cm adenoma - hemicoloectomy  . Breast cancer 10/28/12    right breast  . S/P chemotherapy, time since greater than 12 weeks   . BRCA negative 06/2013  BRCA I & II negative    PAST SURGICAL HISTORY: Past Surgical History  Procedure Laterality Date  . Hemicolectomy  07/28/11  . Breast biopsy Right 10/28/12    Invasive mammary ca, ER/PR=+, Her2 Neu-  . Appendectomy    . Tubal ligation  2006  . Endometrial ablation  2006    HTA    FAMILY  HISTORY Family History  Problem Relation Age of Onset  . Heart disease Father   . Cancer Paternal Aunt     unknown form of cancer  . Lung cancer Maternal Grandmother   . Testicular cancer Cousin     paternal cousin; died in 39s  . Pancreatic cancer Cousin 22  . Colon cancer Other     paternal grandmother's sister  . Cancer Other     MGM's sister   the patient's father died from congestive far to failure at the age of 41. The patient's mother is still living, in her early 30s. The patient has one brother, 4 sisters. There is no history of breast or ovarian cancer in the family to the patient's knowledge.  GYNECOLOGIC HISTORY:  Menarche age 61, first live birth age 17. The patient's less. It was September 2013 and the one before that was March 2013. She is not taking hormone replacement. She did take oral contraceptives for several years intermittently in the past. There were no complications.   SOCIAL HISTORY:  [name] is originally from [place]. 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 She is not a counselor herself, but her husband, [name] (goes by "[name]") works as a Social worker for the [employer] as well as having his own clinic in [place]. He is originally from [place]. The patient's children are [name], who lives in [place] and has a Oceanographer in counseling; and [name], who is a Ship broker at [employer]. He lives with the patient, and shares custody of his daughter, who is 56 years old and spends every other weekend at the patient's home.    ADVANCED DIRECTIVES: In place   HEALTH MAINTENANCE: History  Substance Use Topics  . Smoking status: Never Smoker   . Smokeless tobacco: Never Used  . Alcohol Use: Yes     Comment: wine occasionally     Colonoscopy: 06/02/2011 / [facility]  PAP:  Bone density:  Lipid panel:  Allergies  Allergen Reactions  . Morphine And Related Itching      Filed  Vitals:   01/13/14 0858  BP: 95/40  Pulse: 73  Temp: 98.2 F (36.8 C)  Resp: 18     Body mass index is 20.48 kg/(m^2).    ECOG FS: 1 Filed Weights   01/13/14 0858  Weight: 115 lb 9.6 oz (52.436 kg)   OBJECTIVE: Middle-aged white woman who appears stated age  Sclerae unicteric, EOMs intact Oropharynx clear and moist No cervical or supraclavicular adenopathy Lungs no rales or rhonchi Heart regular rate and rhythm Abd soft, nontender, positive bowel sounds MSK no focal spinal tenderness, no upper extremity lymphedema Neuro: nonfocal, well oriented, mild pressure of speech but very focused Breasts: The right breast is status post lumpectomy and radiation. There is no evidence of recurrence. The right axilla is benign. The left breast is unremarkable   LAB RESULTS:   Lab Results  Component Value Date   WBC 5.8 01/13/2014   NEUTROABS 3.2 01/13/2014   HGB 11.8 01/13/2014   HCT 36.3 01/13/2014   MCV 86.8 01/13/2014   PLT 288 01/13/2014  Chemistry      Component Value Date/Time   NA 141 12/30/2013 0934   NA 139 07/30/2011 0615   K 4.2 12/30/2013 0934   K 4.1 07/30/2011 0615   CL 102 11/03/2012 0821   CL 104 07/30/2011 0615   CO2 27 12/30/2013 0934   CO2 25 07/30/2011 0615   BUN 19.0 12/30/2013 0934   BUN 5* 07/30/2011 0615   CREATININE 0.8 12/30/2013 0934   CREATININE 0.68 07/30/2011 0615      Component Value Date/Time   CALCIUM 9.9 12/30/2013 0934   CALCIUM 8.8 07/30/2011 0615   ALKPHOS 63 12/30/2013 0934   ALKPHOS 56 07/22/2011 0845   AST 16 12/30/2013 0934   AST 14 07/22/2011 0845   ALT 16 12/30/2013 0934   ALT 14 07/22/2011 0845   BILITOT 0.26 12/30/2013 0934   BILITOT 0.3 07/22/2011 0845       STUDIES: No results found.  ASSESSMENT: 53 y.o. [place] woman status post right breast lower inner quadrant biopsy 10/28/2012 for a clinical T1c N0, stage IA invasive ductal carcinoma, grade 2, estrogen and progesterone receptor positive, HER-2 not amplified, with an MIB-1 of  20%  (0) Mayia's genetics show a VUS, namely TP53 c.868C>T.    (1) s/p Right lumpectomy and sentinel lymph node sampling 11/17/2012 for a pT2 pN0, stage IIA invasive ductal carcinoma with prominent lobular features, grade 2, with initially positive  margins cleared intraoperatively; estrogen receptor positive with an Allred score of 7, progesterone receptor positive with an Allred score of 8, HercepTest 0, proliferation index by ACIS III 32% ([id number])   (2) Oncotype DX score of 33 predicting a risk of distant recurrence within 10 years of 23% if her only systemic treatment is tamoxifen for 5 years; the estrogen receptor was interpreted as negative on the Oncotype report  (3) adjuvant cyclophosphamide and docetaxel started 12/30/2012, first cycle given at [facility], with the remaining cycles given  at [facility] and completed 4th cycle 03/03/2013  (4)  adjuvant radiation therapy completed 05/10/2013  (5) started tamoxifen January 2015, stopped March 2015 due to depression  (6) anastrozole started 11/12/2013  PLAN:  We spent approximately 50 minutes going over [place] situation. She seems to have good insight into what is going on and she is willing to work with Dr. Jamse Belfast defined the right mixture of drugs and other interventions that will get her into a more even stated. I think perhaps going on disability for 6 months and taking time to "work on herself" might be helpful for her.   I encouraged her to continue her anastrozole. I suggested she exercise 45 minutes 5 times a week. I suggested she wake up every morning at the same time so she brings her sleeping pattern into some form of entrainment. I reassured her that her prognosis as far as breast cancer is concerned is good.  She is interested in bilateral salpingo-oophorectomy. I think this is very reasonable and we have requested an appointment with Dr. Denman George, our gynecologist oncologist.  Otherwise Lendora will return to see me in about 6  weeks. She knows to call for any problems that may develop before her next visit here. Chauncey Cruel, MD   01/13/2014 9:04 AM

## 2014-01-16 NOTE — Addendum Note (Signed)
Addended by: Amelia Jo I on: 01/16/2014 01:53 PM   Modules accepted: Orders, Medications

## 2014-02-09 ENCOUNTER — Ambulatory Visit: Payer: 59 | Admitting: Gynecologic Oncology

## 2014-02-24 ENCOUNTER — Ambulatory Visit (HOSPITAL_BASED_OUTPATIENT_CLINIC_OR_DEPARTMENT_OTHER): Payer: 59 | Admitting: Oncology

## 2014-02-24 ENCOUNTER — Telehealth: Payer: Self-pay | Admitting: Oncology

## 2014-02-24 VITALS — BP 117/70 | HR 94 | Temp 98.6°F | Resp 18 | Ht 63.0 in | Wt 122.7 lb

## 2014-02-24 DIAGNOSIS — C50319 Malignant neoplasm of lower-inner quadrant of unspecified female breast: Secondary | ICD-10-CM

## 2014-02-24 DIAGNOSIS — Z170 Estrogen receptor positive status [ER+]: Secondary | ICD-10-CM

## 2014-02-24 DIAGNOSIS — C50311 Malignant neoplasm of lower-inner quadrant of right female breast: Secondary | ICD-10-CM

## 2014-02-24 NOTE — Telephone Encounter (Signed)
, °

## 2014-02-24 NOTE — Addendum Note (Signed)
Addended by: Laureen Abrahams on: 02/24/2014 06:19 PM   Modules accepted: Orders

## 2014-02-24 NOTE — Progress Notes (Signed)
ID: [name] OB: Oct 30, 1960  MR#: [id number]  LTR#:[id number]  PCP: Elizabeth Naas, MD GYN:   [name]]; [name] OTHER MD: [name], [name], [name], [name]  HISTORY OF PRESENT ILLNESS: From the original intake node:  [name] had a routine colonoscopy under Dr. Paulita Gonzalez approximately a year and a half ago, showing a right colon lesion which could not be removed intraoperatively. CT scans of the abdomen and pelvis 07/07/2011 were negative except for the question of a soft tissue lesion in the right colon, and particularly there was no adenopathy or evidence of metastatic disease. She underwent partial colectomy under Dr. Madilyn Gonzalez 07/28/2011, and the pathology from that procedure ([id number]) showed a 3.2 cm tubular adenoma without high-grade dysplasia or malignancy. Margins were negative. 0 of 16 lymph nodes were involved.  And the patient has been working at weight loss and managed to lose approximately 25 pounds over the past year. She feels this is what allowed her to palpate a mass in her right breast earlier this month. (She had had negative mammographic screening 02/26/2012). On 10/27/2012 the patient underwent bilateral diagnostic mammography. The breasts are "extremely dense". Mammography did not show any new or worrisome finding. Right breast ultrasound however located an irregular hypoechoic mass in the area of the patient's palpable abnormality. It measured approximately 7 mm. Biopsy of this mass 10/28/2012 showed an invasive ductal carcinoma, grade 2, estrogen and progesterone receptor positive, with no HER-2 amplification, and an MIB-1 of 5%.  Bilateral breast MRI 11/01/2012 found a 1.3 cm irregular enhancing mass in the lower inner quadrant of the right breast, with no other masses of concern in either breast and no abnormal appearing lymph nodes.  The patient's subsequent history is as  detailed below.   INTERVAL HISTORY: [name] returns today accompanied for followup of her right breast cancer. Her husband [name] didn't come with her--she did not tell him she had an appointment today. She did not feel he needed to be here: She says she is working very hard and anyway "things are better". She was supposed to have had an appointment with Dr. Denman George to discuss bilateral salpingo-oophorectomy but she has decided she does not want to consider that at this point so that appointment was canceled.  REVIEW OF SYSTEMS: [name] has pretty much taken herself off all her medications except for the Effexor, which she takes at 37.5 mg, the Klonopin which she takes 2 or 3 times per week to help her sleep, and the anastrozole. She is having significant aches and pains from the anastrozole, really pretty much and all her joints, so I do not think she is going to be able to tolerate that long-term. We reviewed postmenopausal symptoms. The Klonopin does help with sleep. If she takes any more of the venlafaxine then to 37.5 mg dose she gets manic and os on a shopping spray. She is having occasional night sweats. Daytime hot flashes occur "occasionally." Vaginal dryness is an issue. She has come to the "intimacy and pelvic health" and of course she also had teaching during "finding your new normal". She is aware that she has some osteopenia, and she is doing yoga and some walking, although she is not exercising as vigorously as. She also has insomnia. We advised that she get up every morning at the same time and she wakes up at 7:30 in the morning but sometimes hangs around and do that until 8:30. At any rate  that is an improvement. She has occasional problems with hemorrhoids. Her urine is now much better control since she voids every 2 hours the laboratory. Otherwise a detailed review of systems today was stable. PAST MEDICAL HISTORY: Past Medical History  Diagnosis Date  . Asthma     as a child, early  adult...none now  . Recurrent upper respiratory infection (URI)     started 1/28.....getting better  . GERD (gastroesophageal reflux disease)     occas  at  night....twice a  month--otc meds  . Depression   . Anxiety   . Colonic mass 07/28/2011    ascending colon and found a polyp 2.5 cm adenoma - hemicoloectomy  . Breast cancer 10/28/12    right breast  . S/P chemotherapy, time since greater than 12 weeks   . BRCA negative 06/2013    BRCA I & II negative    PAST SURGICAL HISTORY: Past Surgical History  Procedure Laterality Date  . Hemicolectomy  07/28/11  . Breast biopsy Right 10/28/12    Invasive mammary ca, ER/PR=+, Her2 Neu-  . Appendectomy    . Tubal ligation  2006  . Endometrial ablation  2006    HTA    FAMILY HISTORY Family History  Problem Relation Age of Onset  . Heart disease Father   . Cancer Paternal Aunt     unknown form of cancer  . Lung cancer Maternal Grandmother   . Testicular cancer Cousin     paternal cousin; died in 62s  . Pancreatic cancer Cousin 13  . Colon cancer Other     paternal grandmother's sister  . Cancer Other     MGM's sister   the patient's father died from congestive far to failure at the age of 65. The patient's mother is still living, in her early 65s. The patient has one brother, 4 sisters. There is no history of breast or ovarian cancer in the family to the patient's knowledge.  GYNECOLOGIC HISTORY:  Menarche age 23, first live birth age 21. The patient's less. It was September 2013 and the one before that was March 2013. She is not taking hormone replacement. She did take oral contraceptives for several years intermittently in the past. There were no complications.   SOCIAL HISTORY:  [name] is originally from [place]. 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 He is originally from [place]. The patient's children are [name], who lives in [place] and has a Oceanographer in counseling; and [name], who is working in Engineer, technical sales. Both of them live with the patient.  Gonzalez shares custody of his daughter, who is 28 years old and spends every other weekend at the patient's home.    ADVANCED DIRECTIVES: In place   HEALTH MAINTENANCE: History  Substance Use Topics  . Smoking status: Never Smoker   . Smokeless tobacco: Never Used  . Alcohol Use: Yes     Comment: wine occasionally     Colonoscopy: 06/02/2011 / [facility]  PAP:  Bone density:  Lipid panel:  Allergies  Allergen Reactions  . Morphine And Related Itching      Filed Vitals:   02/24/14 1326  BP: 117/70  Pulse: 94  Temp: 98.6 F (37 C)  Resp: 18     Body mass index is 21.74 kg/(m^2).  ECOG FS: 1 Filed Weights   02/24/14 1326  Weight: 122 lb 11.2 oz (55.656 kg)   OBJECTIVE: Middle-aged white woman in no acute distress  Sclerae unicteric, round and equal Oropharynx clear, teeth in good repair No cervical or supraclavicular adenopathy Lungs no rales or rhonchi Heart regular rate and rhythm Abd soft, nontender, positive bowel sounds MSK no focal spinal tenderness, no upper extremity lymphedema Neuro: nonfocal, well oriented, normal and appropriate affect Breasts: The right breast is status post lumpectomy and radiation. There is no evidence of recurrence. The right axilla is benign. The left breast is unremarkable   LAB RESULTS:   Lab Results  Component Value Date   WBC 5.8 01/13/2014   NEUTROABS 3.2 01/13/2014   HGB 11.8 01/13/2014   HCT 36.3 01/13/2014   MCV 86.8 01/13/2014   PLT 288 01/13/2014      Chemistry      Component Value Date/Time   NA 142 01/13/2014 0845   NA 139 07/30/2011 0615   K 3.8 01/13/2014 0845   K 4.1 07/30/2011 0615   CL 102 11/03/2012 0821   CL 104 07/30/2011 0615   CO2 27  01/13/2014 0845   CO2 25 07/30/2011 0615   BUN 16.7 01/13/2014 0845   BUN 5* 07/30/2011 0615   CREATININE 0.8 01/13/2014 0845   CREATININE 0.68 07/30/2011 0615      Component Value Date/Time   CALCIUM 9.3 01/13/2014 0845   CALCIUM 8.8 07/30/2011 0615   ALKPHOS 61 01/13/2014 0845   ALKPHOS 56 07/22/2011 0845   AST 15 01/13/2014 0845   AST 14 07/22/2011 0845   ALT 14 01/13/2014 0845   ALT 14 07/22/2011 0845   BILITOT 0.34 01/13/2014 0845   BILITOT 0.3 07/22/2011 0845       STUDIES: No results found.  ASSESSMENT: 53 y.o. [place] woman status post right breast lower inner quadrant biopsy 10/28/2012 for a clinical T1c N0, stage IA invasive ductal carcinoma, grade 2, estrogen and progesterone receptor positive, HER-2 not amplified, with an MIB-1 of 20%  (0) Sebastian's genetics show a VUS, namely TP53 c.868C>T.    (1) s/p Right lumpectomy and sentinel lymph node sampling 11/17/2012 for a pT2 pN0, stage IIA invasive ductal carcinoma with prominent lobular features, grade 2, with initially positive  margins cleared intraoperatively; estrogen receptor positive with an Allred score of 7, progesterone receptor positive with an Allred score of 8, HercepTest 0, proliferation index by ACIS III 32% ([id number])   (2) Oncotype DX score of 33 predicting a risk of distant recurrence within 10 years of 23% if her only systemic treatment is tamoxifen for 5 years; the estrogen receptor was interpreted as negative on the Oncotype report  (3) adjuvant cyclophosphamide and docetaxel started 12/30/2012, first cycle given at [facility], with the remaining cycles given  at [facility] and completed 4th cycle 03/03/2013  (4)  adjuvant radiation therapy completed 05/10/2013  (5) started tamoxifen January 2015, stopped March 2015 due to depression  (6) anastrozole started 11/12/2013, discontinued 02/24/2014 with significant arthralgias/myalgias  PLAN:  We spent approximately one hour today going over her situation. Things generally  are much better. She is still in counseling. There are some issues regarding her children but they also seem to me to be doing better as well. Kennedee is working and is being productive. I think psychologically she is making her way through the difficult transition of facing menopause abruptly and having to deal with the diagnosis of cancer.  We are stopping the anastrozole.  I think it may be worthwhile to give tamoxifen a second look when she returns to see me in 2 months. I will show her data that tamoxifen does not cause depression. If she refuses to give tamoxifen another try we will consider letrozole.  She has a good understanding of the overall plan. She agrees with this. She knows the goal of treatment in her cases cure. She will call with any problems that may develop before her next visit here.  Chauncey Cruel, MD   02/24/2014 1:42 PM

## 2014-03-30 ENCOUNTER — Encounter: Payer: Self-pay | Admitting: *Deleted

## 2014-03-31 ENCOUNTER — Other Ambulatory Visit: Payer: Self-pay

## 2014-04-17 ENCOUNTER — Encounter: Payer: Self-pay | Admitting: Nurse Practitioner

## 2014-04-20 ENCOUNTER — Other Ambulatory Visit: Payer: Self-pay | Admitting: *Deleted

## 2014-04-20 DIAGNOSIS — C50311 Malignant neoplasm of lower-inner quadrant of right female breast: Secondary | ICD-10-CM

## 2014-04-21 ENCOUNTER — Other Ambulatory Visit (HOSPITAL_BASED_OUTPATIENT_CLINIC_OR_DEPARTMENT_OTHER): Payer: 59

## 2014-04-21 ENCOUNTER — Ambulatory Visit (HOSPITAL_BASED_OUTPATIENT_CLINIC_OR_DEPARTMENT_OTHER): Payer: 59 | Admitting: Oncology

## 2014-04-21 ENCOUNTER — Telehealth: Payer: Self-pay | Admitting: Oncology

## 2014-04-21 VITALS — BP 128/54 | HR 79 | Temp 98.5°F | Resp 18 | Ht 63.0 in | Wt 126.4 lb

## 2014-04-21 DIAGNOSIS — C50311 Malignant neoplasm of lower-inner quadrant of right female breast: Secondary | ICD-10-CM

## 2014-04-21 DIAGNOSIS — M791 Myalgia: Secondary | ICD-10-CM

## 2014-04-21 DIAGNOSIS — M2550 Pain in unspecified joint: Secondary | ICD-10-CM

## 2014-04-21 DIAGNOSIS — Z170 Estrogen receptor positive status [ER+]: Secondary | ICD-10-CM

## 2014-04-21 LAB — CBC WITH DIFFERENTIAL/PLATELET
BASO%: 0.4 % (ref 0.0–2.0)
Basophils Absolute: 0 10*3/uL (ref 0.0–0.1)
EOS%: 5.1 % (ref 0.0–7.0)
Eosinophils Absolute: 0.3 10*3/uL (ref 0.0–0.5)
HCT: 38.4 % (ref 34.8–46.6)
HGB: 12.5 g/dL (ref 11.6–15.9)
LYMPH#: 1.7 10*3/uL (ref 0.9–3.3)
LYMPH%: 31.6 % (ref 14.0–49.7)
MCH: 28.3 pg (ref 25.1–34.0)
MCHC: 32.6 g/dL (ref 31.5–36.0)
MCV: 86.9 fL (ref 79.5–101.0)
MONO#: 0.5 10*3/uL (ref 0.1–0.9)
MONO%: 8.8 % (ref 0.0–14.0)
NEUT#: 3 10*3/uL (ref 1.5–6.5)
NEUT%: 54.1 % (ref 38.4–76.8)
Platelets: 293 10*3/uL (ref 145–400)
RBC: 4.42 10*6/uL (ref 3.70–5.45)
RDW: 13.3 % (ref 11.2–14.5)
WBC: 5.5 10*3/uL (ref 3.9–10.3)

## 2014-04-21 LAB — COMPREHENSIVE METABOLIC PANEL (CC13)
ALT: 33 U/L (ref 0–55)
AST: 23 U/L (ref 5–34)
Albumin: 4.2 g/dL (ref 3.5–5.0)
Alkaline Phosphatase: 85 U/L (ref 40–150)
Anion Gap: 8 mEq/L (ref 3–11)
BUN: 18.1 mg/dL (ref 7.0–26.0)
CALCIUM: 10 mg/dL (ref 8.4–10.4)
CHLORIDE: 105 meq/L (ref 98–109)
CO2: 29 mEq/L (ref 22–29)
CREATININE: 0.8 mg/dL (ref 0.6–1.1)
Glucose: 84 mg/dl (ref 70–140)
POTASSIUM: 4.4 meq/L (ref 3.5–5.1)
Sodium: 143 mEq/L (ref 136–145)
Total Bilirubin: 0.38 mg/dL (ref 0.20–1.20)
Total Protein: 7.4 g/dL (ref 6.4–8.3)

## 2014-04-21 NOTE — Progress Notes (Signed)
ID: [name] OB: 11/13/1960  MR#: [id number]  KGY#:[id number]  PCP: Reginia Naas, MD GYN:   SURenelda Loma Ingram]; Isla Pence OTHER MD: Christene Slates, Thea Silversmith, Arta Silence, Deitra Mayo, Scot Dock Alyson Locket  HISTORY OF PRESENT ILLNESS: From the original intake node:  [name] had a routine colonoscopy under Dr. Paulita Fujita approximately a year and a half ago, showing a right colon lesion which could not be removed intraoperatively. CT scans of the abdomen and pelvis 07/07/2011 were negative except for the question of a soft tissue lesion in the right colon, and particularly there was no adenopathy or evidence of metastatic disease. She underwent partial colectomy under Dr. Madilyn Hook 07/28/2011, and the pathology from that procedure ([id number]) showed a 3.2 cm tubular adenoma without high-grade dysplasia or malignancy. Margins were negative. 0 of 16 lymph nodes were involved.  And the patient has been working at weight loss and managed to lose approximately 25 pounds over the past year. She feels this is what allowed her to palpate a mass in her right breast earlier this month. (She had had negative mammographic screening 02/26/2012). On 10/27/2012 the patient underwent bilateral diagnostic mammography. The breasts are "extremely dense". Mammography did not show any new or worrisome finding. Right breast ultrasound however located an irregular hypoechoic mass in the area of the patient's palpable abnormality. It measured approximately 7 mm. Biopsy of this mass 10/28/2012 showed an invasive ductal carcinoma, grade 2, estrogen and progesterone receptor positive, with no HER-2 amplification, and an MIB-1 of 5%.  Bilateral breast MRI 11/01/2012 found a 1.3 cm irregular enhancing mass in the lower inner quadrant of the right breast, with no other masses of concern in either breast and no abnormal appearing lymph nodes.  The patient's  subsequent history is as detailed below.  INTERVAL HISTORY: [name] returns today for follow-up of her breast canceraccompanied by husband [name]. She has been off anastrozole now for approximately 2 months. She has not found that her arthralgias or myalgias have improved at all. She has pain in her chest and these are like quick stabs. These are very common and of course postoperatively and sometimes they can go on for years. They're of no clinical consequence otherwise. The top of her feet hurt and her hands hurt as well. This is not neuropathy: It is not the tips of the toes and ears that hurt but rather the bony ankles and wrists. Also her shoulders hurt. She is willing to consider that anastrozole may not be the cause, although of course it may be contributing to the problem. She is thinking that she may be ready to try it again.  REVIEW OF SYSTEMS: Janna Oak me that she is now seeing Dr. 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 He started her lithium and took her off Effexor. She said she felt normal (much better than recently). However she gained 8 pounds in a short period of time. When Dr. Clovis Pu try to increase the dose she was unable to sleep. Because of the sleep and weight gain issues she has now gone back to her original treatments with Wellbutrin, Adderall, and Klonopin. However she continues to work closely with Dr. Clovis Pu to try to get these issues taken care of. She is still considering bilateral salpingo-oophorectomy and requested another appointment with Dr. Denman George( who however is on maternity leave at present). [name] continues to work full time. She tells me her children are  doing "better" aside from the issues just noted, she tells me she is depressed and is having hot flashes. Vaginal dryness is not a major issue at present. A detailed review of systems today was otherwise noncontributory  PAST MEDICAL HISTORY: Past Medical History   Diagnosis Date  . Asthma     as a child, early adult...none now  . Recurrent upper respiratory infection (URI)     started 1/28.....getting better  . GERD (gastroesophageal reflux disease)     occas  at  night....twice a  month--otc meds  . Depression   . Anxiety   . Colonic mass 07/28/2011    ascending colon and found a polyp 2.5 cm adenoma - hemicoloectomy  . Breast cancer 10/28/12    right breast  . S/P chemotherapy, time since greater than 12 weeks   . BRCA negative 06/2013    BRCA I & II negative    PAST SURGICAL HISTORY: Past Surgical History  Procedure Laterality Date  . Hemicolectomy  07/28/11  . Breast biopsy Right 10/28/12    Invasive mammary ca, ER/PR=+, Her2 Neu-  . Appendectomy    . Tubal ligation  2006  . Endometrial ablation  2006    HTA    FAMILY HISTORY Family History  Problem Relation Age of Onset  . Heart disease Father   . Cancer Paternal Aunt     unknown form of cancer  . Lung cancer Maternal Grandmother   . Testicular cancer Cousin     paternal cousin; died in 61s  . Pancreatic cancer Cousin 22  . Colon cancer Other     paternal grandmother's sister  . Cancer Other     MGM's sister   the patient's father died from congestive far to failure at the age of 14. The patient's mother is still living, in her early 61s. The patient has one brother, 4 sisters. There is no history of breast or ovarian cancer in the family to the patient's knowledge.  GYNECOLOGIC HISTORY:  Menarche age 74, first live birth age 42. The patient's less. It was September 2013 and the one before that was March 2013. She is not taking hormone replacement. She did take oral contraceptives for several years intermittently in the past. There were no complications.   SOCIAL HISTORY:  [name] is originally from [place]. 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 She is not a counselor herself, but her husband, Addelynn Batte (goes  by "[name]") works as a Social worker for the [employer] as well as having his own clinic in [place]. He is originally from [place]. The patient's children are [name], who lives in [place] and has a Oceanographer in counseling; and Haverhill Cellar, who is working in Engineer, technical sales. Both of them live with the patient. [place] Cellar shares custody of his daughter, who is 57 years old and spends every other weekend at the patient's home.    ADVANCED DIRECTIVES: In place   HEALTH MAINTENANCE: History  Substance Use Topics  . Smoking status: Never Smoker   . Smokeless tobacco: Never Used  . Alcohol Use: Yes     Comment: wine occasionally     Colonoscopy: 06/02/2011 / [facility]  PAP:  Bone density:  Lipid panel:  Allergies  Allergen Reactions  . Morphine And Related Itching       Filed Vitals:   04/21/14 1047  BP: 128/54  Pulse: 79  Temp: 98.5 F (36.9 C)  Resp: 18     Body mass index  is 22.4 kg/(m^2).    ECOG FS: 1 Filed Weights   04/21/14 1047  Weight: 126 lb 6.4 oz (57.335 kg)   OBJECTIVE: Middle-aged white woman Who appears stated age  Sclerae unicteric,EOMs intact Oropharynx clearand moist No cervical or supraclavicular adenopathy Lungs no rales or rhonchi Heart regular rate and rhythm Abd soft, nontender, positive bowel sounds MSK no focal spinal tenderness, no upper extremity lymphedema Neuro: nonfocal, well oriented, appropriate affect Breasts: The right breast is status post lumpectomy and radiation. There is no evidence of local recurrence. The right axilla is benign. The left breast is unremarkable   LAB RESULTS:   Lab Results  Component Value Date   WBC 5.5 04/21/2014   NEUTROABS 3.0 04/21/2014   HGB 12.5 04/21/2014   HCT 38.4 04/21/2014   MCV 86.9 04/21/2014   PLT 293 04/21/2014      Chemistry      Component Value Date/Time   NA 143 04/21/2014 1038   NA 139 07/30/2011 0615   K 4.4 04/21/2014 1038   K 4.1 07/30/2011 0615   CL 102 11/03/2012 0821    CL 104 07/30/2011 0615   CO2 29 04/21/2014 1038   CO2 25 07/30/2011 0615   BUN 18.1 04/21/2014 1038   BUN 5* 07/30/2011 0615   CREATININE 0.8 04/21/2014 1038   CREATININE 0.68 07/30/2011 0615      Component Value Date/Time   CALCIUM 10.0 04/21/2014 1038   CALCIUM 8.8 07/30/2011 0615   ALKPHOS 85 04/21/2014 1038   ALKPHOS 56 07/22/2011 0845   AST 23 04/21/2014 1038   AST 14 07/22/2011 0845   ALT 33 04/21/2014 1038   ALT 14 07/22/2011 0845   BILITOT 0.38 04/21/2014 1038   BILITOT 0.3 07/22/2011 0845       STUDIES: No results found.  ASSESSMENT: 53 y.o. [place] woman status post right breast lower inner quadrant biopsy 10/28/2012 for a clinical T1c N0, stage IA invasive ductal carcinoma, grade 2, estrogen and progesterone receptor positive, HER-2 not amplified, with an MIB-1 of 20%  (0) Adalei's genetics show a VUS, namely TP53 c.[id number]>T.    (1) s/p Right lumpectomy and sentinel lymph node sampling 11/17/2012 for a pT2 pN0, stage IIA invasive ductal carcinoma with prominent lobular features, grade 2, with initially positive  margins cleared intraoperatively; estrogen receptor positive with an Allred score of 7, progesterone receptor positive with an Allred score of 8, HercepTest 0, proliferation index by ACIS III 32% ([id number])   (2) Oncotype DX score of 33 predicting a risk of distant recurrence within 10 years of 23% if her only systemic treatment is tamoxifen for 5 years; the estrogen receptor was interpreted as negative on the Oncotype report  (3) adjuvant cyclophosphamide and docetaxel started 12/30/2012, first cycle given at [facility], with the remaining cycles given  at [facility] and completed 4th cycle 03/03/2013  (4)  adjuvant radiation therapy completed 05/10/2013  (5) started tamoxifen January 2015, stopped March 2015 due to depression  (6) anastrozole started 11/12/2013, discontinued 02/24/2014 with significant arthralgias/myalgias, resumed November 2015 with no  improvement in the arthralgias myalgias  PLAN:  We spent approximately 55 minutes today discussing the fact that anastrozole has long been out of her system and yet she is still having the same pains as before. Some of these pains, the very rapid stabbing or stinging pains in the breast, R postsurgical and are of no clinical consequence. There are common and we do not usually treat them except to offer the patient reassurance.  The other pains may benefit from physical therapy and reticular early for more exercise. I have suggested a range of motion exercise and some cardio after appropriate warm up. I gave her a copy of the [employer] pamphlet and urged her to go through that program. I think she would feel considerably better if she started on a regular program of exercise.  We then discussed whether she was ready to go back to anastrozole or whether she preferred to try tamoxifen. I gave her information that tamoxifen does not cause or worsen depression. Nevertheless she has a very strong association between depression and tamoxifen because of her own experience and absolutely refuses to go back to that medication.  They have identified on MRI service in [place] that we will do bilateral breast MRIs for $800. They wanted me to place an order and I gave them a written prescription. They will fax the results to me.  To some up: we are resuming anastrozole now. We reviewed the side effects, toxicities and complications of that medication. She will let me know if she has any unusual problems with the anastrozole. Otherwise I will see her again in 3 months.  She will call with any problems that may develop before her next visit here.  Chauncey Cruel, MD   04/21/2014 11:16 AM

## 2014-04-21 NOTE — Telephone Encounter (Signed)
, °

## 2014-04-24 NOTE — Addendum Note (Signed)
Addended by: Jaci Carrel A on: 04/24/2014 11:33 AM   Modules accepted: Orders, Medications

## 2014-05-15 ENCOUNTER — Encounter: Payer: Self-pay | Admitting: Oncology

## 2014-05-16 NOTE — Telephone Encounter (Addendum)
Printed for provider review and clarification of exemestane verses tamoxifen.

## 2014-05-17 ENCOUNTER — Encounter: Payer: Self-pay | Admitting: *Deleted

## 2014-05-17 ENCOUNTER — Other Ambulatory Visit: Payer: Self-pay | Admitting: *Deleted

## 2014-05-17 ENCOUNTER — Telehealth: Payer: Self-pay | Admitting: *Deleted

## 2014-05-17 MED ORDER — EXEMESTANE 25 MG PO TABS: 25.0000 mg | ORAL_TABLET | Freq: Every day | ORAL | Status: DC

## 2014-05-17 NOTE — Telephone Encounter (Signed)
Per my chart inquiry pt requested exemestane prescription instead of arimidex.  Of note pt has declined use of tamoxifen due to severe depression she incurred while on this drug previously.  Per MD pt may use exemestane but wanted pt to be aware of possible cost concern over the arimidex.  This RN placed a my chart message to pt explaining the above.  Prescription for exemestane called to pharmacy.

## 2014-05-17 NOTE — Telephone Encounter (Signed)
[facility] has called and messaged patient.  Exemestane approved by provider and called to pharmacy as requested by patient.

## 2014-06-01 ENCOUNTER — Encounter: Payer: Self-pay | Admitting: Oncology

## 2014-06-01 NOTE — Telephone Encounter (Signed)
Printed message for provider review.

## 2014-06-27 ENCOUNTER — Encounter: Payer: Self-pay | Admitting: Oncology

## 2014-07-03 ENCOUNTER — Other Ambulatory Visit: Payer: Self-pay | Admitting: Oncology

## 2014-07-03 DIAGNOSIS — C50911 Malignant neoplasm of unspecified site of right female breast: Secondary | ICD-10-CM

## 2014-07-03 NOTE — Progress Notes (Unsigned)
I called [name] with the results of her breast MRI performed at [place], [place]. Within the lower inner quadrant of the right breast posteriorly in the area of postsurgical change there is a small seroma measuring 1.4 cm. In the lower quadrant of the right breast at approximately 4:00 there are multiple foci of enhancement in a ductal distribution. This is approximately 2.2 cm anterior and slightly superior to the area of prior surgery. This is 3.2 cm posterior to the nipple, 1.4 cm from the medial skin margin, and 0.5 cm anterior to the chest wall. This is new compared with the previous exam. There was no abnormal enhancement in the left breast, minimal skin retraction in the area of prior surgery in the right breast, and no evidence of axillary lymphadenopathy.  I have set her up for diagnostic mammography with biopsy of the right breast at [facility]. She are ready has an appointment with me late February.

## 2014-07-04 ENCOUNTER — Telehealth: Payer: Self-pay | Admitting: Oncology

## 2014-07-04 ENCOUNTER — Encounter: Payer: Self-pay | Admitting: Oncology

## 2014-07-04 ENCOUNTER — Telehealth: Payer: Self-pay | Admitting: *Deleted

## 2014-07-04 NOTE — Telephone Encounter (Signed)
lvm for pt and advised on 1.20 appt at [facility] @ 2pm

## 2014-07-04 NOTE — Telephone Encounter (Signed)
This RN spoke with pt's husband- Joneen Boers- regarding his concerns with " they didn't schedule a biopsy- only a mammogram "  This RN informed Joneen Boers per protocol - need to verify abnormal area seen on outside MRI of breast- mammogram needs to be obtained.  If abnormal area noted then protocol is to schedule the biopsy at that time.  Joneen Boers verbalized understanding.  Pt is currently scheduled for mammogram 07/05/2014 at [place].

## 2014-07-05 ENCOUNTER — Other Ambulatory Visit: Payer: Self-pay | Admitting: *Deleted

## 2014-07-10 ENCOUNTER — Encounter: Payer: Self-pay | Admitting: Oncology

## 2014-07-25 ENCOUNTER — Encounter: Payer: Self-pay | Admitting: Oncology

## 2014-07-26 NOTE — Telephone Encounter (Signed)
Faxed my chart message from 07/25/14 to dr magrinat's nurse

## 2014-07-31 ENCOUNTER — Other Ambulatory Visit: Payer: Self-pay | Admitting: Oncology

## 2014-08-01 ENCOUNTER — Encounter: Payer: Self-pay | Admitting: Oncology

## 2014-08-03 ENCOUNTER — Ambulatory Visit: Payer: 59 | Admitting: Nurse Practitioner

## 2014-08-14 ENCOUNTER — Other Ambulatory Visit (HOSPITAL_BASED_OUTPATIENT_CLINIC_OR_DEPARTMENT_OTHER): Payer: 59

## 2014-08-14 ENCOUNTER — Telehealth: Payer: Self-pay | Admitting: Oncology

## 2014-08-14 ENCOUNTER — Ambulatory Visit (HOSPITAL_BASED_OUTPATIENT_CLINIC_OR_DEPARTMENT_OTHER): Payer: 59 | Admitting: Oncology

## 2014-08-14 ENCOUNTER — Other Ambulatory Visit: Payer: Self-pay | Admitting: Emergency Medicine

## 2014-08-14 VITALS — BP 101/53 | HR 81 | Temp 98.7°F | Resp 18 | Ht 63.0 in | Wt 124.4 lb

## 2014-08-14 DIAGNOSIS — C50311 Malignant neoplasm of lower-inner quadrant of right female breast: Secondary | ICD-10-CM

## 2014-08-14 DIAGNOSIS — Z170 Estrogen receptor positive status [ER+]: Secondary | ICD-10-CM

## 2014-08-14 LAB — CBC WITH DIFFERENTIAL/PLATELET
BASO%: 0.5 % (ref 0.0–2.0)
BASOS ABS: 0.1 10*3/uL (ref 0.0–0.1)
EOS ABS: 0.3 10*3/uL (ref 0.0–0.5)
EOS%: 3.3 % (ref 0.0–7.0)
HEMATOCRIT: 36.1 % (ref 34.8–46.6)
HEMOGLOBIN: 11.9 g/dL (ref 11.6–15.9)
LYMPH%: 24.1 % (ref 14.0–49.7)
MCH: 28.5 pg (ref 25.1–34.0)
MCHC: 33 g/dL (ref 31.5–36.0)
MCV: 86.4 fL (ref 79.5–101.0)
MONO#: 0.6 10*3/uL (ref 0.1–0.9)
MONO%: 5.9 % (ref 0.0–14.0)
NEUT#: 6.2 10*3/uL (ref 1.5–6.5)
NEUT%: 66.2 % (ref 38.4–76.8)
Platelets: 266 10*3/uL (ref 145–400)
RBC: 4.18 10*6/uL (ref 3.70–5.45)
RDW: 14.6 % — ABNORMAL HIGH (ref 11.2–14.5)
WBC: 9.3 10*3/uL (ref 3.9–10.3)
lymph#: 2.3 10*3/uL (ref 0.9–3.3)

## 2014-08-14 LAB — COMPREHENSIVE METABOLIC PANEL (CC13)
ALBUMIN: 4.2 g/dL (ref 3.5–5.0)
ALT: 15 U/L (ref 0–55)
ANION GAP: 11 meq/L (ref 3–11)
AST: 19 U/L (ref 5–34)
Alkaline Phosphatase: 66 U/L (ref 40–150)
BILIRUBIN TOTAL: 0.31 mg/dL (ref 0.20–1.20)
BUN: 25.9 mg/dL (ref 7.0–26.0)
CO2: 25 mEq/L (ref 22–29)
Calcium: 9.7 mg/dL (ref 8.4–10.4)
Chloride: 105 mEq/L (ref 98–109)
Creatinine: 0.8 mg/dL (ref 0.6–1.1)
EGFR: 88 mL/min/{1.73_m2} — ABNORMAL LOW (ref >90)
Glucose: 79 mg/dl (ref 70–140)
Potassium: 4 mEq/L (ref 3.5–5.1)
Sodium: 141 mEq/L (ref 136–145)
TOTAL PROTEIN: 6.9 g/dL (ref 6.4–8.3)

## 2014-08-14 NOTE — Telephone Encounter (Signed)
per pof to sch pt appt-gave pt copy of sch °

## 2014-08-14 NOTE — Progress Notes (Signed)
ID: [name] OB: 1961-05-24  MR#: [id number]  EUX#:[id number]  PCP: Reginia Naas, MD GYN:   SURenelda Loma Ingram]; Isla Pence OTHER MD: Christene Slates, Thea Silversmith, Arta Silence, Deitra Mayo, Justice Britain, Jadene Pierini Cottle, Ladoris Gene Scheri  HISTORY OF PRESENT ILLNESS: From the original intake node:  [name] had a routine colonoscopy under Dr. Paulita Fujita approximately a year and a half ago, showing a right colon lesion which could not be removed intraoperatively. CT scans of the abdomen and pelvis 07/07/2011 were negative except for the question of a soft tissue lesion in the right colon, and particularly there was no adenopathy or evidence of metastatic disease. She underwent partial colectomy under Dr. Madilyn Hook 07/28/2011, and the pathology from that procedure ([id number]) showed a 3.2 cm tubular adenoma without high-grade dysplasia or malignancy. Margins were negative. 0 of 16 lymph nodes were involved.  And the patient has been working at weight loss and managed to lose approximately 25 pounds over the past year. She feels this is what allowed her to palpate a mass in her right breast earlier this month. (She had had negative mammographic screening 02/26/2012). On 10/27/2012 the patient underwent bilateral diagnostic mammography. The breasts are "extremely dense". Mammography did not show any new or worrisome finding. Right breast ultrasound however located an irregular hypoechoic mass in the area of the patient's palpable abnormality. It measured approximately 7 mm. Biopsy of this mass 10/28/2012 showed an invasive ductal carcinoma, grade 2, estrogen and progesterone receptor positive, with no HER-2 amplification, and an MIB-1 of 5%.  Bilateral breast MRI 11/01/2012 found a 1.3 cm irregular enhancing mass in the lower inner quadrant of the right breast, with no other masses of concern in either breast and no abnormal appearing lymph nodes.  The  patient's subsequent history is as detailed below.  INTERVAL HISTORY: [name] returns today for follow-up of her breast cancer. After her last visit here she gave anastrozole a try, but again could not tolerated. She then started exemestane. She is having no significant side effects on that medication. She started late November 2015. She obtains it approximately $35 a month, which is doable.  Quite aside from this, she obtained breast MRI with a 1.5 Tesla machine in [place], which was less expensive ($800). This showed some new areas of calcifications which were felt to be worrisome. She discuss that with our mammographers and they felt uncomfortable biopsying them. She then saw Dr.Scheri  at [facility] and after much discussion his team set her up for an MRI guided biopsy of the area in question. I cannot get the full report out of "care everywhere", that it was read as benign according to [facility].  REVIEW OF SYSTEMS: [name] is doing well with her work in her marriage. Her children are both getting married this year. [name] is marrying April 8 (her husband's name is Tripp), and [name] is getting married June 12 (2 Melchor Amour). [name] is very happy that she can phase these weddings without worrying about her breast cancer. Aside from these issues, she has minimal urinary urgency. She has some joint pains here in there which are not more intense or persistent than before. Her depression is under control on an increased dose of Wellbutrin. A detailed review of systems today was otherwise stable.  PAST MEDICAL HISTORY: Past Medical History  Diagnosis Date  . Asthma     as a child, early adult...none now  . Recurrent upper respiratory infection (URI)  started 1/28.....getting better  . GERD (gastroesophageal reflux disease)     occas  at  night....twice a  month--otc meds  . Depression   . Anxiety   . Colonic mass 07/28/2011    ascending colon and found a polyp 2.5 cm adenoma -  hemicoloectomy  . Breast cancer 10/28/12    right breast  . S/P chemotherapy, time since greater than 12 weeks   . BRCA negative 06/2013    BRCA I & II negative    PAST SURGICAL HISTORY: Past Surgical History  Procedure Laterality Date  . Hemicolectomy  07/28/11  . Breast biopsy Right 10/28/12    Invasive mammary ca, ER/PR=+, Her2 Neu-  . Appendectomy    . Tubal ligation  2006  . Endometrial ablation  2006    HTA    FAMILY HISTORY Family History  Problem Relation Age of Onset  . Heart disease Father   . Cancer Paternal Aunt     unknown form of cancer  . Lung cancer Maternal Grandmother   . Testicular cancer Cousin     paternal cousin; died in 41s  . Pancreatic cancer Cousin 75  . Colon cancer Other     paternal grandmother's sister  . Cancer Other     MGM's sister   the patient's father died from congestive far to failure at the age of 68. The patient's mother is still living, in her early 33s. The patient has one brother, 4 sisters. There is no history of breast or ovarian cancer in the family to the patient's knowledge.  GYNECOLOGIC HISTORY:  Menarche age 83, first live birth age 42. The patient's less. It was September 2013 and the one before that was March 2013. She is not taking hormone replacement. She did take oral contraceptives for several years intermittently in the past. There were no complications.   SOCIAL HISTORY:  [name] is originally from [place]. 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 She is not a counselor herself, but her husband, Felma Pfefferle (goes by "[name]") works as a Social worker for the [employer] as well as having his own clinic in [place]. He is originally from [place]. The patient's children are [name], who lives in [place] and has a Oceanographer in counseling; and [name], who is working in Engineer, technical sales. Both of them live with the patient. [name] shares custody of his  daughter, who is 31 years old and spends every other weekend at the patient's home.    ADVANCED DIRECTIVES: In place   HEALTH MAINTENANCE: History  Substance Use Topics  . Smoking status: Never Smoker   . Smokeless tobacco: Never Used  . Alcohol Use: Yes     Comment: wine occasionally     Colonoscopy: 06/02/2011 / [facility]  PAP:  Bone density:  Lipid panel:  Allergies  Allergen Reactions  . Morphine And Related Itching       Filed Vitals:   08/14/14 0918  BP: 101/53  Pulse: 81  Temp: 98.7 F (37.1 C)  Resp: 18     Body mass index is 22.04 kg/(m^2).    ECOG FS: 1 Filed Weights   08/14/14 0918  Weight: 124 lb 6.4 oz (56.427 kg)   OBJECTIVE: Middle-aged white woman in no acute distress  Sclerae unicteric, pupils equal and reactive Oropharynx clear and moist-- no thrush No cervical or supraclavicular adenopathy Lungs no rales or rhonchi Heart regular rate and rhythm Abd soft, nontender, positive bowel sounds MSK no focal spinal  tenderness, no upper extremity lymphedema Neuro: nonfocal, well oriented, appropriate affect Breasts: The right breast is status post lumpectomy and recent biopsy. There is no dehiscence, erythema, or swelling. There is still some soreness. There is no evidence of disease recurrence. The right axilla is benign. The left breast is unremarkable.    LAB RESULTS:   Lab Results  Component Value Date   WBC 9.3 08/14/2014   NEUTROABS 6.2 08/14/2014   HGB 11.9 08/14/2014   HCT 36.1 08/14/2014   MCV 86.4 08/14/2014   PLT 266 08/14/2014      Chemistry      Component Value Date/Time   NA 143 04/21/2014 1038   NA 139 07/30/2011 0615   K 4.4 04/21/2014 1038   K 4.1 07/30/2011 0615   CL 102 11/03/2012 0821   CL 104 07/30/2011 0615   CO2 29 04/21/2014 1038   CO2 25 07/30/2011 0615   BUN 18.1 04/21/2014 1038   BUN 5* 07/30/2011 0615   CREATININE 0.8 04/21/2014 1038   CREATININE 0.68 07/30/2011 0615      Component Value Date/Time    CALCIUM 10.0 04/21/2014 1038   CALCIUM 8.8 07/30/2011 0615   ALKPHOS 85 04/21/2014 1038   ALKPHOS 56 07/22/2011 0845   AST 23 04/21/2014 1038   AST 14 07/22/2011 0845   ALT 33 04/21/2014 1038   ALT 14 07/22/2011 0845   BILITOT 0.38 04/21/2014 1038   BILITOT 0.3 07/22/2011 0845       STUDIES: Outside results reviewed  ASSESSMENT: 54 y.o. [place] woman status post right breast lower inner quadrant biopsy 10/28/2012 for a clinical T1c N0, stage IA invasive ductal carcinoma, grade 2, estrogen and progesterone receptor positive, HER-2 not amplified, with an MIB-1 of 20%  (0) Shanita's genetics show a VUS, namely TP53 c.868C>T.    (1) s/p Right lumpectomy and sentinel lymph node sampling 11/17/2012 for a pT2 pN0, stage IIA invasive ductal carcinoma with prominent lobular features, grade 2, with initially positive  margins cleared intraoperatively; estrogen receptor positive with an Allred score of 7, progesterone receptor positive with an Allred score of 8, HercepTest 0, proliferation index by ACIS III 32% (SL 12-75170)   (2) Oncotype DX score of 33 predicting a risk of distant recurrence within 10 years of 23% if her only systemic treatment is tamoxifen for 5 years; the estrogen receptor was interpreted as negative on the Oncotype report  (3) adjuvant cyclophosphamide and docetaxel started 12/30/2012, first cycle given at [facility], with the remaining cycles given  at [facility] and completed 4th cycle 03/03/2013  (4)  adjuvant radiation therapy completed 05/10/2013  (5) started tamoxifen January 2015, stopped March 2015 due to depression  (6) anastrozole started 11/12/2013, discontinued 02/24/2014 with significant arthralgias/myalgias, resumed November 2015 with no improvement in the arthralgias myalgias, again discontinued  (7) exemestane started November 2015    PLAN:  It is very gratifying that Isabela is almost 2 years out from her definitive surgery with no evidence of disease  recurrence. She is tolerating the exemestane well, and the plan will be to continue that for a total of 5 years.  She is scheduled for repeat breast MRI at [facility] in August. I'm going to see her again in September and if all goes well I will continue to see her every 6 months until she completes the 5 years of follow-up.  She knows to call for any problems that may develop before her next visit here.  Chauncey Cruel, MD   08/14/2014 9:20 AM

## 2014-08-22 ENCOUNTER — Encounter: Payer: Self-pay | Admitting: Oncology

## 2014-10-18 ENCOUNTER — Encounter: Payer: Self-pay | Admitting: Oncology

## 2014-11-16 ENCOUNTER — Encounter: Payer: Self-pay | Admitting: Oncology

## 2015-01-19 ENCOUNTER — Telehealth: Payer: Self-pay | Admitting: *Deleted

## 2015-01-19 NOTE — Telephone Encounter (Signed)
Patient's dentist called requesting information.  "[name] informed me she is under treatment for cancer.  Can she have dental cleaning with lidocaine and epinephrine or is there an interaction?"  Observed patient received taxotere/cytoxan last March 03, 2013 and currently on aromasin. Informed Dr. Grayce Sessions there is no interaction or contraindication for her to receive dental care at this time.

## 2015-03-02 ENCOUNTER — Encounter: Payer: Self-pay | Admitting: Oncology

## 2015-03-07 ENCOUNTER — Other Ambulatory Visit: Payer: Self-pay

## 2015-03-07 DIAGNOSIS — C50311 Malignant neoplasm of lower-inner quadrant of right female breast: Secondary | ICD-10-CM

## 2015-03-08 ENCOUNTER — Telehealth: Payer: Self-pay | Admitting: Oncology

## 2015-03-08 ENCOUNTER — Other Ambulatory Visit (HOSPITAL_BASED_OUTPATIENT_CLINIC_OR_DEPARTMENT_OTHER): Payer: 59

## 2015-03-08 ENCOUNTER — Ambulatory Visit (HOSPITAL_BASED_OUTPATIENT_CLINIC_OR_DEPARTMENT_OTHER): Payer: 59 | Admitting: Oncology

## 2015-03-08 VITALS — BP 121/67 | HR 75 | Temp 98.7°F | Resp 18 | Ht 63.0 in | Wt 129.9 lb

## 2015-03-08 DIAGNOSIS — C50511 Malignant neoplasm of lower-outer quadrant of right female breast: Secondary | ICD-10-CM | POA: Diagnosis not present

## 2015-03-08 DIAGNOSIS — C50311 Malignant neoplasm of lower-inner quadrant of right female breast: Secondary | ICD-10-CM | POA: Diagnosis not present

## 2015-03-08 LAB — COMPREHENSIVE METABOLIC PANEL (CC13)
ALT: 13 U/L (ref 0–55)
AST: 17 U/L (ref 5–34)
Albumin: 4.1 g/dL (ref 3.5–5.0)
Alkaline Phosphatase: 73 U/L (ref 40–150)
Anion Gap: 8 mEq/L (ref 3–11)
BUN: 17.3 mg/dL (ref 7.0–26.0)
CALCIUM: 9.5 mg/dL (ref 8.4–10.4)
CO2: 28 mEq/L (ref 22–29)
Chloride: 107 mEq/L (ref 98–109)
Creatinine: 0.9 mg/dL (ref 0.6–1.1)
EGFR: 78 mL/min/{1.73_m2} — AB (ref >90)
Glucose: 86 mg/dl (ref 70–140)
POTASSIUM: 4.4 meq/L (ref 3.5–5.1)
Sodium: 142 mEq/L (ref 136–145)
Total Bilirubin: <0.3 mg/dL (ref 0.20–1.20)
Total Protein: 7 g/dL (ref 6.4–8.3)

## 2015-03-08 LAB — CBC WITH DIFFERENTIAL/PLATELET
BASO%: 0.5 % (ref 0.0–2.0)
BASOS ABS: 0 10*3/uL (ref 0.0–0.1)
EOS%: 5 % (ref 0.0–7.0)
Eosinophils Absolute: 0.4 10*3/uL (ref 0.0–0.5)
HCT: 35.5 % (ref 34.8–46.6)
HGB: 11.7 g/dL (ref 11.6–15.9)
LYMPH%: 30.9 % (ref 14.0–49.7)
MCH: 28.7 pg (ref 25.1–34.0)
MCHC: 33 g/dL (ref 31.5–36.0)
MCV: 87 fL (ref 79.5–101.0)
MONO#: 0.5 10*3/uL (ref 0.1–0.9)
MONO%: 6.2 % (ref 0.0–14.0)
NEUT#: 4.3 10*3/uL (ref 1.5–6.5)
NEUT%: 57.4 % (ref 38.4–76.8)
Platelets: 309 10*3/uL (ref 145–400)
RBC: 4.08 10*6/uL (ref 3.70–5.45)
RDW: 14.1 % (ref 11.2–14.5)
WBC: 7.5 10*3/uL (ref 3.9–10.3)
lymph#: 2.3 10*3/uL (ref 0.9–3.3)

## 2015-03-08 MED ORDER — EXEMESTANE 25 MG PO TABS: 25.0000 mg | ORAL_TABLET | Freq: Every day | ORAL | Status: DC

## 2015-03-08 MED ORDER — OMEPRAZOLE 40 MG PO CPDR: 40.0000 mg | DELAYED_RELEASE_CAPSULE | Freq: Every day | ORAL | Status: DC

## 2015-03-08 NOTE — Telephone Encounter (Signed)
Appointments made and avs printed for patient °

## 2015-03-08 NOTE — Progress Notes (Signed)
ID: [name] OB: 1961-03-03  MR#: [id number]  UUE#:[id number]  PCP: Reginia Naas, MD GYN:   SURenelda Loma Ingram]; Isla Pence OTHER MD: Christene Slates, Thea Silversmith, Arta Silence, Deitra Mayo, Justice Britain, Jadene Pierini Cottle, Ladoris Gene Scheri  HISTORY OF PRESENT ILLNESS: From the original intake node:  [name] had a routine colonoscopy under Dr. Paulita Fujita approximately a year and a half ago, showing a right colon lesion which could not be removed intraoperatively. CT scans of the abdomen and pelvis 07/07/2011 were negative except for the question of a soft tissue lesion in the right colon, and particularly there was no adenopathy or evidence of metastatic disease. She underwent partial colectomy under Dr. Madilyn Hook 07/28/2011, and the pathology from that procedure ([id number]) showed a 3.2 cm tubular adenoma without high-grade dysplasia or malignancy. Margins were negative. 0 of 16 lymph nodes were involved.  And the patient has been working at weight loss and managed to lose approximately 25 pounds over the past year. She feels this is what allowed her to palpate a mass in her right breast earlier this month. (She had had negative mammographic screening 02/26/2012). On 10/27/2012 the patient underwent bilateral diagnostic mammography. The breasts are "extremely dense". Mammography did not show any new or worrisome finding. Right breast ultrasound however located an irregular hypoechoic mass in the area of the patient's palpable abnormality. It measured approximately 7 mm. Biopsy of this mass 10/28/2012 showed an invasive ductal carcinoma, grade 2, estrogen and progesterone receptor positive, with no HER-2 amplification, and an MIB-1 of 5%.  Bilateral breast MRI 11/01/2012 found a 1.3 cm irregular enhancing mass in the lower inner quadrant of the right breast, with no other masses of concern in either breast and no abnormal appearing lymph nodes.  The  patient's subsequent history is as detailed below.  INTERVAL HISTORY: [name] returns today for follow-up of her breast cancer accompanied by her husband [name]. She continues on exemestane. She is having more arthralgias and myalgias and particularly significant pain in the knees, to the point that she can barely walk. She saw Dr. supple regarding this and he did some films which according to [facility] showed no abnormality. He suggested swimming. Things have only gotten worse since that time. Aside from the arthralgias/myalgias she continues to have night sweats. She is not currently on gabapentin. She obtains the exemestane for approximately $30 a month.  REVIEW OF SYSTEMS: [name] continues to work full-time. She still having some cognitive dysfunction. She tried Vyvanse but it actually made her very anxious so she stopped that medication. She's gained a little bit of weight because she can't exercise with her knees hurting as much as they are. However is not just the knees. Her hands also hurt. She wonders if she has a rheumatic condition or if it is really the exemestane. It also could be osteoarthritis as she knows. Also she need significant dental work. She is going to be having that done in [place] because of cost issues. A detailed review of systems today was otherwise stable  PAST MEDICAL HISTORY: Past Medical History  Diagnosis Date  . Asthma     as a child, early adult...none now  . Recurrent upper respiratory infection (URI)     started 1/28.....getting better  . GERD (gastroesophageal reflux disease)     occas  at  night....twice a  month--otc meds  . Depression   . Anxiety   . Colonic mass 07/28/2011    ascending colon and  found a polyp 2.5 cm adenoma - hemicoloectomy  . Breast cancer 10/28/12    right breast  . S/P chemotherapy, time since greater than 12 weeks   . BRCA negative 06/2013    BRCA I & II negative    PAST SURGICAL HISTORY: Past Surgical History  Procedure  Laterality Date  . Hemicolectomy  07/28/11  . Breast biopsy Right 10/28/12    Invasive mammary ca, ER/PR=+, Her2 Neu-  . Appendectomy    . Tubal ligation  2006  . Endometrial ablation  2006    HTA    FAMILY HISTORY Family History  Problem Relation Age of Onset  . Heart disease Father   . Cancer Paternal Aunt     unknown form of cancer  . Lung cancer Maternal Grandmother   . Testicular cancer Cousin     paternal cousin; died in 27s  . Pancreatic cancer Cousin 2  . Colon cancer Other     paternal grandmother's sister  . Cancer Other     MGM's sister   the patient's father died from congestive far to failure at the age of 80. The patient's mother is still living, in her early 70s. The patient has one brother, 4 sisters. There is no history of breast or ovarian cancer in the family to the patient's knowledge.  GYNECOLOGIC HISTORY:  Menarche age 59, first live birth age 61. The patient's less. It was September 2013 and the one before that was March 2013. She is not taking hormone replacement. She did take oral contraceptives for several years intermittently in the past. There were no complications.   SOCIAL HISTORY:  [name] is originally from [place]. 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 He is originally from [place]. The patient's children are [name], who lives in [place] and has a Oceanographer in counseling; and Cousins Island Cellar, who is working in Engineer, technical sales. Both of them are now married and have moved out of the house. Gardiner Cellar shares custody of his daughter, who is 44 years old.    ADVANCED DIRECTIVES: In place   HEALTH MAINTENANCE: Social History  Substance Use Topics  . Smoking status: Never Smoker   . Smokeless tobacco: Never Used  . Alcohol  Use: Yes     Comment: wine occasionally     Colonoscopy: 06/02/2011 / [facility]  PAP:  Bone density:  Lipid panel:  Allergies  Allergen Reactions  . Morphine And Related Itching       Filed Vitals:   03/08/15 1450  BP: 121/67  Pulse: 75  Temp: 98.7 F (37.1 C)  Resp: 18     Body mass index is 23.02 kg/(m^2).    ECOG FS: 1 Filed Weights   03/08/15 1450  Weight: 129 lb 14.4 oz (58.922 kg)   OBJECTIVE: Middle-aged white woman who appears well  Sclerae unicteric, EOMs intact Oropharynx clear, 2 teeth missing in the right maxilla including a canine No cervical or supraclavicular adenopathy Lungs no rales or rhonchi Heart regular rate and rhythm Abd soft, nontender, positive bowel sounds MSK no focal spinal tenderness, no upper extremity lymphedema Neuro: nonfocal, well oriented, appropriate affect Breasts: The right breast is status post lumpectomy and radiation and is known to have a fluid collection. I do not palpate a mass. There are no skin or  nipple changes of concern. The right axilla is benign per the left breast is unremarkable.    LAB RESULTS:   Lab Results  Component Value Date   WBC 7.5 03/08/2015   NEUTROABS 4.3 03/08/2015   HGB 11.7 03/08/2015   HCT 35.5 03/08/2015   MCV 87.0 03/08/2015   PLT 309 03/08/2015      Chemistry      Component Value Date/Time   NA 141 08/14/2014 0847   NA 139 07/30/2011 0615   K 4.0 08/14/2014 0847   K 4.1 07/30/2011 0615   CL 102 11/03/2012 0821   CL 104 07/30/2011 0615   CO2 25 08/14/2014 0847   CO2 25 07/30/2011 0615   BUN 25.9 08/14/2014 0847   BUN 5* 07/30/2011 0615   CREATININE 0.8 08/14/2014 0847   CREATININE 0.68 07/30/2011 0615      Component Value Date/Time   CALCIUM 9.7 08/14/2014 0847   CALCIUM 8.8 07/30/2011 0615   ALKPHOS 66 08/14/2014 0847   ALKPHOS 56 07/22/2011 0845   AST 19 08/14/2014 0847   AST 14 07/22/2011 0845   ALT 15 08/14/2014 0847   ALT 14 07/22/2011 0845   BILITOT 0.31 08/14/2014  0847   BILITOT 0.3 07/22/2011 0845       STUDIES: Bilateral diagnostic mammography at [facility] 12/20/2014 with tomosynthesis, showed the breast composition to be category D. There were no findings of concern. The right breast ultrasound was obtained to review the fluid collection in the right breast at 4:00, currently measuring 1.2 cm. This is anechoic. It is decreased in size. There is no vascularity. This is consistent with a seroma. She also had an MRI at "[facility]" in [facility] [phone]) August 2016, showing no significant findings  ASSESSMENT: 54 y.o. [place] woman status post right breast lower inner quadrant biopsy 10/28/2012 for a clinical T1c N0, stage IA invasive ductal carcinoma, grade 2, estrogen and progesterone receptor positive, HER-2 not amplified, with an MIB-1 of 20%  (0) Alanta's genetics show a VUS, namely TP53 c.868C>T.    (1) s/p Right lumpectomy and sentinel lymph node sampling 11/17/2012 for a pT2 pN0, stage IIA invasive ductal carcinoma with prominent lobular features, grade 2, with initially positive  margins cleared intraoperatively; estrogen receptor positive with an Allred score of 7, progesterone receptor positive with an Allred score of 8, HercepTest 0, proliferation index by ACIS III 32% (SL [id number])   (2) Oncotype DX score of 33 predicting a risk of distant recurrence within 10 years of 23% if her only systemic treatment is tamoxifen for 5 years; the estrogen receptor was interpreted as negative on the Oncotype report  (3) adjuvant cyclophosphamide and docetaxel started 12/30/2012, first cycle given at [facility], with the remaining cycles given  at [facility] and completed 4th cycle 03/03/2013  (4)  adjuvant radiation therapy completed 05/10/2013  (5) started tamoxifen January 2015, stopped March 2015 due to depression  (6) anastrozole started 11/12/2013, discontinued 02/24/2014 with significant arthralgias/myalgias, resumed November 2015 with  no improvement in the arthralgias myalgias, again discontinued  (7) exemestane started November 2015  (a) held 03/14/2015 because of arthralgias and myalgias.    PLAN:  Laina is now a little over 2 years out from her definitive surgery with no evidence of disease recurrence. This is very favorable.  I really don't think the exemestane is can I be the reason for her knee problems. She was walking her dog who pulled her down and that I think is the explanation for  that. However she does have significant arthralgias and myalgias elsewhere. She is going to keep a diary of her symptoms for the next 48 hours and then stop the exemestane as of 03/14/2015. She will call me a month later and tell me whether her symptoms are better or not. If her symptoms are better and then we will stay off the exemestane and she will see me in November to discuss alternatives. If her symptoms are not better though we will refer her to orthopedics (probably [facility] for a second opinion since she already went to [facility] previously) and possibly also to rheumatology.  In the meantime I have added Prilosec to her medications since I think otherwise she will get significant gastritis from taking so much ibuprofen  She has a good understanding of this plan. She knows to call for any problems that may develop before her next visit here.  Chauncey Cruel, MD   03/08/2015 3:08 PM

## 2015-03-09 ENCOUNTER — Encounter: Payer: Self-pay | Admitting: Oncology

## 2015-03-14 ENCOUNTER — Other Ambulatory Visit: Payer: Self-pay | Admitting: Oncology

## 2015-04-17 IMAGING — US IR FLUORO GUIDE CV LINE*L*
1 series · 1 of 1 positions shown · non-contrast
Comparison: None

INDICATION: History of right-sided breast cancer, in the aid of
intravenous access for chemotherapy administration

IMPLANTED PORT A CATH PLACEMENT WITH ULTRASOUND AND FLUOROSCOPIC
GUIDANCE

[Series 1: sp fluoro guide cv line*left* · 1 of 1 slices shown]
[im 1/1]
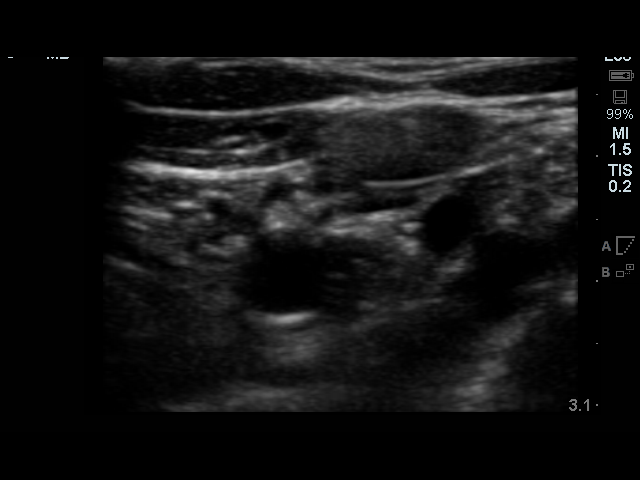

[1 of 1 positions shown; findings below may reference images not displayed]

Sedation: Versed 2 mg IV; Fentanyl 100 mcg IV; Ancef 2 gm IV; IV
antibiotic was given in an appropriate time interval prior to skin
puncture.

Total Moderate Sedation Time: 30 minutes.

Contrast: None

Fluoroscopy Time: 30-seconds

Complications: None immediate

Procedure:

The procedure, risks, benefits, and alternatives were explained to
the patient.  Questions regarding the procedure were encouraged and
answered.  The patient understands and consents to the procedure.

Given the presence of right-sided breast cancer, the decision was
made to place a left anterior chest wall internal jugular approach
port catheter.  As such, the left neck and chest were prepped with
chlorhexidine in a sterile fashion, and a sterile drape was applied
covering the operative field.  Maximum barrier sterile technique
with sterile gowns and gloves were used for the procedure.  A
timeout was performed prior to the initiation of the procedure.
Local anesthesia was provided with 1% lidocaine with epinephrine.

After creating a small venotomy incision, a micropuncture kit was
utilized to access the right internal jugular vein under direct,
real-time ultrasound guidance.  Ultrasound image documentation was
performed.  The microwire was kinked to measure appropriate
catheter length.

A subcutaneous port pocket was then created along the upper chest
wall utilizing a combination of sharp and blunt dissection.  The
pocket was irrigated with sterile saline.  A single lumen ISP power
injectable port was chosen for placement.  The 8 Fr catheter was
tunneled from the port pocket site to the venotomy incision.  The
port was placed in the pocket.  The external catheter was trimmed
to appropriate length.  At the venotomy, an 8 Fr peel-away sheath
was placed over a guidewire under fluoroscopic guidance.  The
catheter was then placed through the sheath and the sheath was
removed.  Final catheter positioning was confirmed and documented
with a fluoroscopic spot radiograph.  The port was accessed with Cil
Qingli needle, aspirated and flushed with heparinized saline.

The venotomy site was closed with an interrupted 4-0 Vicryl suture.
The port pocket incision was closed with interrupted 2-0 Vicryl
suture and the skin was opposed with a running subcuticular 4-0
Vicryl suture.  Dermabond and Eishan were applied to both
incisions.  Dressings were placed.  The patient tolerated the
procedure well without immediate post procedural complication.
FINDINGS: After catheter placement, the tip lies at the superior cavoatrial
junction.  The catheter aspirates and flushes normally and is ready
for immediate use.
IMPRESSION: Successful placement of a left internal jugular approach single
lumen power injectable Port-A-Cath.  The catheter is ready for
immediate use.

## 2015-04-18 ENCOUNTER — Encounter: Payer: Self-pay | Admitting: Oncology

## 2015-04-26 ENCOUNTER — Other Ambulatory Visit: Payer: Self-pay | Admitting: Oncology

## 2015-04-26 DIAGNOSIS — C50019 Malignant neoplasm of nipple and areola, unspecified female breast: Secondary | ICD-10-CM

## 2015-04-28 ENCOUNTER — Encounter: Payer: Self-pay | Admitting: Oncology

## 2015-05-14 ENCOUNTER — Other Ambulatory Visit: Payer: Self-pay

## 2015-05-14 DIAGNOSIS — C50311 Malignant neoplasm of lower-inner quadrant of right female breast: Secondary | ICD-10-CM

## 2015-05-15 ENCOUNTER — Other Ambulatory Visit (HOSPITAL_BASED_OUTPATIENT_CLINIC_OR_DEPARTMENT_OTHER): Payer: 59

## 2015-05-15 ENCOUNTER — Ambulatory Visit (HOSPITAL_BASED_OUTPATIENT_CLINIC_OR_DEPARTMENT_OTHER): Payer: 59 | Admitting: Oncology

## 2015-05-15 VITALS — BP 122/71 | HR 85 | Temp 98.7°F | Resp 18 | Ht 63.0 in | Wt 130.7 lb

## 2015-05-15 DIAGNOSIS — M1990 Unspecified osteoarthritis, unspecified site: Secondary | ICD-10-CM

## 2015-05-15 DIAGNOSIS — M810 Age-related osteoporosis without current pathological fracture: Secondary | ICD-10-CM

## 2015-05-15 DIAGNOSIS — C50311 Malignant neoplasm of lower-inner quadrant of right female breast: Secondary | ICD-10-CM

## 2015-05-15 LAB — CBC WITH DIFFERENTIAL/PLATELET
BASO%: 0.7 % (ref 0.0–2.0)
Basophils Absolute: 0 10*3/uL (ref 0.0–0.1)
EOS ABS: 0.3 10*3/uL (ref 0.0–0.5)
EOS%: 4.3 % (ref 0.0–7.0)
HEMATOCRIT: 39.6 % (ref 34.8–46.6)
HGB: 12.6 g/dL (ref 11.6–15.9)
LYMPH#: 2 10*3/uL (ref 0.9–3.3)
LYMPH%: 30.6 % (ref 14.0–49.7)
MCH: 27.9 pg (ref 25.1–34.0)
MCHC: 31.9 g/dL (ref 31.5–36.0)
MCV: 87.6 fL (ref 79.5–101.0)
MONO#: 0.5 10*3/uL (ref 0.1–0.9)
MONO%: 8 % (ref 0.0–14.0)
NEUT%: 56.4 % (ref 38.4–76.8)
NEUTROS ABS: 3.6 10*3/uL (ref 1.5–6.5)
PLATELETS: 325 10*3/uL (ref 145–400)
RBC: 4.52 10*6/uL (ref 3.70–5.45)
RDW: 13.6 % (ref 11.2–14.5)
WBC: 6.4 10*3/uL (ref 3.9–10.3)

## 2015-05-15 LAB — COMPREHENSIVE METABOLIC PANEL (CC13)
ALT: 36 U/L (ref 0–55)
ANION GAP: 8 meq/L (ref 3–11)
AST: 28 U/L (ref 5–34)
Albumin: 4.3 g/dL (ref 3.5–5.0)
Alkaline Phosphatase: 73 U/L (ref 40–150)
BUN: 16.7 mg/dL (ref 7.0–26.0)
CALCIUM: 10 mg/dL (ref 8.4–10.4)
CO2: 27 mEq/L (ref 22–29)
CREATININE: 1 mg/dL (ref 0.6–1.1)
Chloride: 104 mEq/L (ref 98–109)
EGFR: 65 mL/min/{1.73_m2} — AB (ref >90)
Glucose: 88 mg/dl (ref 70–140)
Potassium: 4.3 mEq/L (ref 3.5–5.1)
Sodium: 139 mEq/L (ref 136–145)
TOTAL PROTEIN: 7.6 g/dL (ref 6.4–8.3)
Total Bilirubin: 0.39 mg/dL (ref 0.20–1.20)

## 2015-05-15 MED ORDER — LETROZOLE 2.5 MG PO TABS: 2.5000 mg | ORAL_TABLET | Freq: Every day | ORAL | Status: DC

## 2015-05-15 NOTE — Progress Notes (Signed)
ID: [name] OB: [birth year]  MR#: [id number]  CBJ#:[id number]  PCP: Elizabeth Naas, MD GYN:   SURenelda Loma Gonzalez]; Isla Pence OTHER MD: [name], [name], [name], [name], [name], [name]  CHIEF COMPLAINT: Estrogen receptor positive breast cancer  CURRENT TREATMENT: Letrozole  HISTORY OF PRESENT ILLNESS: From the original intake node:  [name] had a routine colonoscopy under Dr. Paulita Gonzalez approximately a year and a half ago, showing a right colon lesion which could not be removed intraoperatively. CT scans of the abdomen and pelvis 07/07/2011 were negative except for the question of a soft tissue lesion in the right colon, and particularly there was no adenopathy or evidence of metastatic disease. She underwent partial colectomy under Dr. Madilyn Gonzalez 07/28/2011, and the pathology from that procedure ([name]) showed a 3.2 cm tubular adenoma without high-grade dysplasia or malignancy. Margins were negative. 0 of 16 lymph nodes were involved.  And the patient has been working at weight loss and managed to lose approximately 25 pounds over the past year. She feels this is what allowed her to palpate a mass in her right breast earlier this month. (She had had negative mammographic screening 02/26/2012). On 10/27/2012 the patient underwent bilateral diagnostic mammography. The breasts are "extremely dense". Mammography did not show any new or worrisome finding. Right breast ultrasound however located an irregular hypoechoic mass in the area of the patient's palpable abnormality. It measured approximately 7 mm. Biopsy of this mass 10/28/2012 showed an invasive ductal carcinoma, grade 2, estrogen and progesterone receptor positive, with no HER-2 amplification, and an MIB-1 of 5%.  Bilateral breast MRI 11/01/2012 found a 1.3 cm irregular enhancing mass in the lower inner quadrant of the  right breast, with no other masses of concern in either breast and no abnormal appearing lymph nodes.  The patient's subsequent history is as detailed below.  INTERVAL HISTORY: [name] returns today for follow-up of her estrogen receptor positive breast cancer accompanied by her husband [name]. She went off exemestane in September. There was absolutely no difference in the persistent pain she continues to experience chiefly in her knees and hands. She saw [name] and underwent left knee surgery with significant relief. She is considering having right knee surgery as well. This is pretty much convinced her that the exemestane she was taking was not the reason for her osteoarthritis symptoms. She is now ready to reconsider anti-estrogens  Since the last visit here she also saw an integrative physician, Dr. Wynona Gonzalez, who diagnosed her and her husband as having Lyme disease.  REVIEW OF SYSTEMS: [name] has gained some weight because she has been unable to exercise, but she is about to start her former rehabilitation and wants to use that as a spring board towards a fuller exercise program. She did very well with the knee surgery itself. She has moderately constipated. She denies fevers, rash, unusual headaches, visual changes, dizziness, or gait imbalance. Has been no cough, phlegm production, or pleurisy. A detailed review of systems today was otherwise stable.  PAST MEDICAL HISTORY: Past Medical History  Diagnosis Date  . Asthma     as a child, early adult...none now  . Recurrent upper respiratory infection (URI)     started 1/28.....getting better  . GERD (gastroesophageal reflux disease)     occas  at  night....twice a  month--otc meds  . Depression   . Anxiety   . Colonic mass 07/28/2011  ascending colon and found a polyp 2.5 cm adenoma - hemicoloectomy  . Breast cancer 10/28/12    right breast  . S/P chemotherapy, time since greater than 12 weeks   . BRCA negative 06/2013    BRCA I &  II negative    PAST SURGICAL HISTORY: Past Surgical History  Procedure Laterality Date  . Hemicolectomy  07/28/11  . Breast biopsy Right 10/28/12    Invasive mammary ca, ER/PR=+, Her2 Neu-  . Appendectomy    . Tubal ligation  2006  . Endometrial ablation  2006    HTA    FAMILY HISTORY Family History  Problem Relation Age of Onset  . Heart disease Father   . Cancer Paternal Aunt     unknown form of cancer  . Lung cancer Maternal Grandmother   . Testicular cancer Cousin     paternal cousin; died in 40s  . Pancreatic cancer Cousin 5  . Colon cancer Other     paternal grandmother's sister  . Cancer Other     MGM's sister   the patient's father died from congestive far to failure at the age of 25. The patient's mother is still living, in her early 68s. The patient has one brother, 4 sisters. There is no history of breast or ovarian cancer in the family to the patient's knowledge.  GYNECOLOGIC HISTORY:  Menarche age 58, first live birth age 43. The patient's less. It was September 2013 and the one before that was March 2013. She is not taking hormone replacement. She did take oral contraceptives for several years intermittently in the past. There were no complications.   SOCIAL HISTORY:  (Updated November 2016) [name] is originally from [place]. 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 She is not a counselor herself, but her husband, [name] (goes by "[name]") works as a Social worker for the [employer] as well as having his own clinic in [place]. He is originally from [place]. The patient's children are [name], who lives in [place] and has a Oceanographer in counseling; and [name], who is working in Engineer, technical sales. Both of them are now married and have moved out of the house. [name] shares custody of his daughter, who is 66 years old.    ADVANCED DIRECTIVES: In place   HEALTH MAINTENANCE: Social History   Substance Use Topics  . Smoking status: Never Smoker   . Smokeless tobacco: Never Used  . Alcohol Use: Yes     Comment: wine occasionally     Colonoscopy: 06/02/2011 / [facility]  PAP:  Bone density:  Lipid panel:  Allergies  Allergen Reactions  . Morphine And Related Itching       Filed Vitals:   05/15/15 1437  BP: 122/71  Pulse: 85  Temp: 98.7 F (37.1 C)  Resp: 18     Body mass index is 23.16 kg/(m^2).    ECOG FS: 1 Filed Weights   05/15/15 1437  Weight: 130 lb 11.2 oz (59.285 kg)   OBJECTIVE: Middle-aged white woman who looks healthy  Sclerae unicteric, pupils round and equal Oropharynx clear and moist-- no thrush or other lesions No cervical or supraclavicular adenopathy Lungs no rales or rhonchi Heart regular rate and rhythm Abd soft, nontender, positive bowel sounds MSK no focal spinal tenderness, no upper extremity lymphedema Neuro: nonfocal, well oriented, appropriate affect Breasts: The right breast is status post lumpectomy and radiation. There is no evidence of local recurrence. The right axilla is benign per the  left breast is unremarkable.   LAB RESULTS:   Lab Results  Component Value Date   WBC 6.4 05/15/2015   NEUTROABS 3.6 05/15/2015   HGB 12.6 05/15/2015   HCT 39.6 05/15/2015   MCV 87.6 05/15/2015   PLT 325 05/15/2015      Chemistry      Component Value Date/Time   NA 142 03/08/2015 1434   NA 139 07/30/2011 0615   K 4.4 03/08/2015 1434   K 4.1 07/30/2011 0615   CL 102 11/03/2012 0821   CL 104 07/30/2011 0615   CO2 28 03/08/2015 1434   CO2 25 07/30/2011 0615   BUN 17.3 03/08/2015 1434   BUN 5* 07/30/2011 0615   CREATININE 0.9 03/08/2015 1434   CREATININE 0.68 07/30/2011 0615      Component Value Date/Time   CALCIUM 9.5 03/08/2015 1434   CALCIUM 8.8 07/30/2011 0615   ALKPHOS 73 03/08/2015 1434   ALKPHOS 56 07/22/2011 0845   AST 17 03/08/2015 1434   AST 14 07/22/2011 0845   ALT 13 03/08/2015 1434   ALT 14 07/22/2011 0845    BILITOT <0.30 03/08/2015 1434   BILITOT 0.3 07/22/2011 0845       STUDIES: Bone density at [facility] obtained 04/23/2015 shows osteoporosis, with a worse score at -2.5 in the left femur  ASSESSMENT: 54 y.o. [place] woman status post right breast lower inner quadrant biopsy 10/28/2012 for a clinical T1c N0, stage IA invasive ductal carcinoma, grade 2, estrogen and progesterone receptor positive, HER-2 not amplified, with an MIB-1 of 20%  (0) [name]'s genetics show a VUS, namely TP53 [id number].    (1) s/p Right lumpectomy and sentinel lymph node sampling 11/17/2012 for a pT2 pN0, stage IIA invasive ductal carcinoma with prominent lobular features, grade 2, with initially positive  margins cleared intraoperatively; estrogen receptor positive with an Allred score of 7, progesterone receptor positive with an Allred score of 8, HercepTest 0, proliferation index by ACIS III 32% ([id number])   (2) Oncotype DX score of 33 predicting a risk of distant recurrence within 10 years of 23% if her only systemic treatment is tamoxifen for 5 years; the estrogen receptor was interpreted as negative on the Oncotype report  (3) adjuvant cyclophosphamide and docetaxel started 12/30/2012, first cycle given at [facility], with the remaining cycles given  at [facility] and completed 4th cycle 03/03/2013  (4)  adjuvant radiation therapy completed 05/10/2013  (5) started tamoxifen January 2015, stopped March 2015 due to depression  (6) anastrozole started 11/12/2013, discontinued 02/24/2014 with significant arthralgias/myalgias, resumed November 2015 with no improvement in the arthralgias myalgias, again discontinued  (7) exemestane started November 2015  (a) held 03/14/2015 because of arthralgias and myalgias.  (8) letrozole started 05/16/2015  (a) bone density 04/23/2015 at [place] shows osteoporosis with a T score of -2.0    PLAN:  Chrysten now feels most of the aches and pains that she was experiencing previously were  really due to arthritis. The pains did not get any better with stopping the exemestane and they did get considerably better with the surgery she underwent. She is planning to have surgery to the right knee sometime in January.  She wonders if she should see a rheumatologist for her arthritis. I think this may be helpful and I will be glad to place that referral.  Both she and [name] have been told that they have Lyme disease. They did live in [place] for some time. I think the diagnosis should be confirmed by an infectious  disease specialist since there are so many false positives related to this issue. I will facilitate that for them.  I am concerned about [name]. He did appear quite depressed today. He is aware of it and is taking appropriate measures.  Otherwise we are going to letrozole. It should be no worse than exemestane in terms of symptoms and it certainly will be less expensive. I am going to see Kalena in February just to make sure she is tolerating this well. She will be repeating mammography sometime in May and a breast MRI perhaps around a year from now.  At the next visit also we will discuss bisphosphonates versus Denosumab  She knows to call for any problems that may develop before then  Chauncey Cruel, MD   05/15/2015 2:46 PM

## 2015-05-16 ENCOUNTER — Telehealth: Payer: Self-pay | Admitting: Oncology

## 2015-05-16 NOTE — Telephone Encounter (Signed)
Called patient and she is aware to call dr deveshwar for her arthritis

## 2015-05-18 ENCOUNTER — Encounter: Payer: Self-pay | Admitting: Oncology

## 2015-05-21 ENCOUNTER — Telehealth: Payer: Self-pay

## 2015-05-21 NOTE — Telephone Encounter (Signed)
Per Dr Johnnye Sima patient was contacted with appointment.  Referral from Dr Ron Agee.   Laverle Patter , RN

## 2015-05-21 NOTE — Progress Notes (Signed)
Your first available is June 06, 2015. Did you want to work her in ?

## 2015-06-06 ENCOUNTER — Ambulatory Visit (INDEPENDENT_AMBULATORY_CARE_PROVIDER_SITE_OTHER): Payer: 59 | Admitting: Infectious Diseases

## 2015-06-06 ENCOUNTER — Encounter: Payer: Self-pay | Admitting: Infectious Diseases

## 2015-06-06 VITALS — BP 130/83 | HR 86 | Temp 98.2°F | Ht 63.0 in | Wt 131.0 lb

## 2015-06-06 DIAGNOSIS — M1990 Unspecified osteoarthritis, unspecified site: Secondary | ICD-10-CM | POA: Insufficient documentation

## 2015-06-06 LAB — RHEUMATOID FACTOR: Rhuematoid fact SerPl-aCnc: 12 IU/mL (ref <=14)

## 2015-06-06 LAB — C-REACTIVE PROTEIN: CRP: <0.5 mg/dL (ref <0.60)

## 2015-06-06 NOTE — Progress Notes (Signed)
   Subjective:    Patient ID: [name], female    DOB: 1961/04/06, 55 y.o.   MRN: [id number]  HPI 54 yo F with hx of R breast invasive ductal CA dx 10-2012. She had lumpectomy, CTX, XRT.  She has also been followed for a hemicoloectomy for benign polyp. She also had R shoulder, L knee surgery.  She comes to ID today to be evaluated for Lyme serologies. She was tested for Lyme due to arthritis over the last several years. Esp after starting her CTX. She wakes up in AM with pain, it wakes her up. Feels like the progression rate has increased. Feels like she is having "brain fog" having difficulty with word finding  Born in [place]. Moved to [place] when 54 yo. Moved to [place], lived there til 2003. Has been in [place] since 2003.  [place]   Has had tick bites in [place], 3 or 4.  No hx of rash.   She had a Lyme Wb with positive bands at 51, 48, 23 (02-2015) as part of her arthritis w/u. She was reffered to Dr Estanislado Pandy but was told that she could not be seen unless she has RA (?).      PMHx, Fhx, Sochx reviewed in EPIC, updated.   Review of Systems  Constitutional: Negative for appetite change and unexpected weight change.  HENT: Positive for tinnitus.   Gastrointestinal: Positive for constipation. Negative for diarrhea.  Genitourinary: Negative for difficulty urinating.  Musculoskeletal: Positive for arthralgias.  Neurological: Negative for headaches.  Hematological: Negative for adenopathy.       Objective:   Physical Exam  Constitutional: She appears well-developed and well-nourished.  HENT:  Mouth/Throat: No oropharyngeal exudate.  Eyes: EOM are normal. Pupils are equal, round, and reactive to light.  Neck: Neck supple.  Cardiovascular: Normal rate, regular rhythm and normal heart sounds.   Pulmonary/Chest: Effort normal and breath sounds normal.  Abdominal: Soft. Bowel sounds are normal. There is no tenderness. There is no rebound.  Musculoskeletal: She exhibits no edema or  tenderness.       Arms: Lymphadenopathy:    She has no cervical adenopathy.          Assessment & Plan:

## 2015-06-06 NOTE — Assessment & Plan Note (Signed)
The etiology of her arthritis is unclear.  We spoke about her Lyme Wb, that she has border line/positive test. We will repeat this by standard criteria (elisa and WB) and go back over her results when available.  If her tests are negative (and most likely if they are positive) will try to get her set up with Dr Charlestine Night in rheum.  We spoke that her test is most likely a false positive, that we could consider a 1 month tiral of doxy. And also, that it is unclear that this would offer any relief if she has had infection for more than 10 years.  Will see her back in 2 weeks.

## 2015-06-07 LAB — LYME AB/WESTERN BLOT REFLEX: B burgdorferi Ab IgG+IgM: 0.23 {ISR}

## 2015-06-07 LAB — ANCA SCREEN W REFLEX TITER: ANCA Screen: NEGATIVE

## 2015-06-07 LAB — SEDIMENTATION RATE: Sed Rate: 11 mm/hr (ref 0–30)

## 2015-06-07 LAB — ANA: Anti Nuclear Antibody(ANA): NEGATIVE

## 2015-06-27 ENCOUNTER — Encounter: Payer: Self-pay | Admitting: Infectious Diseases

## 2015-06-27 ENCOUNTER — Ambulatory Visit (INDEPENDENT_AMBULATORY_CARE_PROVIDER_SITE_OTHER): Payer: 59 | Admitting: Infectious Diseases

## 2015-06-27 DIAGNOSIS — M1990 Unspecified osteoarthritis, unspecified site: Secondary | ICD-10-CM | POA: Diagnosis not present

## 2015-06-27 NOTE — Assessment & Plan Note (Signed)
Her repeat Ab is negative.  Her arthritis labs are also unremarkable.  Will get her an eval with Rheum.  rtc prn

## 2015-06-27 NOTE — Progress Notes (Signed)
   Subjective:    Patient ID: [name], female    DOB: 09-Apr-1961, 55 y.o.   MRN: [id number]  HPI 55 yo F with hx of R breast invasive ductal CA dx 10-2012. She had lumpectomy, CTX, XRT.  She has also been followed for a hemicoloectomy for benign polyp. She also had R shoulder, L knee surgery.  She comes to ID today to be evaluated for Lyme serologies. She was tested for Lyme due to arthritis over the last several years. Esp after starting her CTX. She wakes up in AM with pain, it wakes her up. Feels like the progression rate has increased. Feels like she is having "brain fog" having difficulty with word finding  Born in [place]. Moved to [place] when 55 yo. Moved to [place], lived there til 2003. Has been in [place] since 2003[place]  [place]  Has had tick bites in [place], 3 or 4.  No hx of rash.   She had a Lyme Wb with positive bands at 51, 48, 23 (02-2015) as part of her arthritis w/u. She was seen in ID on 12-21 and had her Lyme Ab repeated (negative). A WB did not reflex due to this.  No change in her arthritis since last visit. Is getting PT eval.   Review of Systems See above    Objective:   Physical Exam  Constitutional: She appears well-developed and well-nourished.  Psychiatric: She has a normal mood and affect.      Assessment & Plan:

## 2015-07-12 ENCOUNTER — Telehealth: Payer: Self-pay | Admitting: *Deleted

## 2015-07-12 NOTE — Telephone Encounter (Signed)
Patient's husband inquiring about her rheumatology referral. Please contact patient's husband. Landis Gandy, RN

## 2015-07-12 NOTE — Telephone Encounter (Signed)
Per Dr. Elmon Else office, patient has been scheduled 2/2 2:45. Patient aware.

## 2015-08-01 ENCOUNTER — Other Ambulatory Visit: Payer: Self-pay

## 2015-08-01 DIAGNOSIS — C50311 Malignant neoplasm of lower-inner quadrant of right female breast: Secondary | ICD-10-CM

## 2015-08-02 ENCOUNTER — Other Ambulatory Visit (HOSPITAL_BASED_OUTPATIENT_CLINIC_OR_DEPARTMENT_OTHER): Payer: 59

## 2015-08-02 ENCOUNTER — Telehealth: Payer: Self-pay | Admitting: Oncology

## 2015-08-02 ENCOUNTER — Ambulatory Visit (HOSPITAL_BASED_OUTPATIENT_CLINIC_OR_DEPARTMENT_OTHER): Payer: 59 | Admitting: Oncology

## 2015-08-02 VITALS — BP 112/52 | HR 116 | Temp 97.8°F | Resp 18 | Ht 63.0 in | Wt 131.3 lb

## 2015-08-02 DIAGNOSIS — C50311 Malignant neoplasm of lower-inner quadrant of right female breast: Secondary | ICD-10-CM | POA: Diagnosis not present

## 2015-08-02 DIAGNOSIS — M810 Age-related osteoporosis without current pathological fracture: Secondary | ICD-10-CM

## 2015-08-02 DIAGNOSIS — F329 Major depressive disorder, single episode, unspecified: Secondary | ICD-10-CM

## 2015-08-02 DIAGNOSIS — M797 Fibromyalgia: Secondary | ICD-10-CM

## 2015-08-02 LAB — CBC WITH DIFFERENTIAL/PLATELET
BASO%: 0.7 % (ref 0.0–2.0)
BASOS ABS: 0 10*3/uL (ref 0.0–0.1)
EOS ABS: 0.2 10*3/uL (ref 0.0–0.5)
EOS%: 2.8 % (ref 0.0–7.0)
HEMATOCRIT: 39.4 % (ref 34.8–46.6)
HGB: 12.8 g/dL (ref 11.6–15.9)
LYMPH#: 1.8 10*3/uL (ref 0.9–3.3)
LYMPH%: 24.4 % (ref 14.0–49.7)
MCH: 28.3 pg (ref 25.1–34.0)
MCHC: 32.4 g/dL (ref 31.5–36.0)
MCV: 87.2 fL (ref 79.5–101.0)
MONO#: 0.4 10*3/uL (ref 0.1–0.9)
MONO%: 5.9 % (ref 0.0–14.0)
NEUT#: 4.7 10*3/uL (ref 1.5–6.5)
NEUT%: 66.2 % (ref 38.4–76.8)
PLATELETS: 295 10*3/uL (ref 145–400)
RBC: 4.52 10*6/uL (ref 3.70–5.45)
RDW: 14 % (ref 11.2–14.5)
WBC: 7.2 10*3/uL (ref 3.9–10.3)

## 2015-08-02 LAB — COMPREHENSIVE METABOLIC PANEL
ALT: 21 U/L (ref 0–55)
ANION GAP: 9 meq/L (ref 3–11)
AST: 19 U/L (ref 5–34)
Albumin: 4.2 g/dL (ref 3.5–5.0)
Alkaline Phosphatase: 71 U/L (ref 40–150)
BUN: 11.2 mg/dL (ref 7.0–26.0)
CALCIUM: 10 mg/dL (ref 8.4–10.4)
CHLORIDE: 105 meq/L (ref 98–109)
CO2: 28 meq/L (ref 22–29)
CREATININE: 1 mg/dL (ref 0.6–1.1)
EGFR: 67 mL/min/{1.73_m2} — AB (ref >90)
Glucose: 102 mg/dl (ref 70–140)
POTASSIUM: 3.8 meq/L (ref 3.5–5.1)
Sodium: 143 mEq/L (ref 136–145)
Total Bilirubin: <0.3 mg/dL (ref 0.20–1.20)
Total Protein: 7.6 g/dL (ref 6.4–8.3)

## 2015-08-02 NOTE — Progress Notes (Signed)
ID: [name] OB: [birth year]  MR#: [id number]  ASN#:[id number]  PCP: Reginia Naas, MD GYN:   SURenelda Loma Ingram]; Isla Pence OTHER MD: Christene Slates, Thea Silversmith, Arta Silence, Deitra Mayo, Fortunato Curling; Hurley Cisco; Bobby Rumpf  CHIEF COMPLAINT: Estrogen receptor positive breast cancer  CURRENT TREATMENT: Letrozole  HISTORY OF PRESENT ILLNESS: From the original intake node:  [name] had a routine colonoscopy under Dr. Paulita Fujita approximately a year and a half ago, showing a right colon lesion which could not be removed intraoperatively. CT scans of the abdomen and pelvis 07/07/2011 were negative except for the question of a soft tissue lesion in the right colon, and particularly there was no adenopathy or evidence of metastatic disease. She underwent partial colectomy under Dr. Madilyn Hook 07/28/2011, and the pathology from that procedure ([id number]) showed a 3.2 cm tubular adenoma without high-grade dysplasia or malignancy. Margins were negative. 0 of 16 lymph nodes were involved.  And the patient has been working at weight loss and managed to lose approximately 25 pounds over the past year. She feels this is what allowed her to palpate a mass in her right breast earlier this month. (She had had negative mammographic screening 02/26/2012). On 10/27/2012 the patient underwent bilateral diagnostic mammography. The breasts are "extremely dense". Mammography did not show any new or worrisome finding. Right breast ultrasound however located an irregular hypoechoic mass in the area of the patient's palpable abnormality. It measured approximately 7 mm. Biopsy of this mass 10/28/2012 showed an invasive ductal carcinoma, grade 2, estrogen and progesterone receptor positive, with no HER-2 amplification, and an MIB-1 of 5%.  Bilateral breast MRI 11/01/2012 found a 1.3 cm irregular enhancing mass in the lower inner  quadrant of the right breast, with no other masses of concern in either breast and no abnormal appearing lymph nodes.  The patient's subsequent history is as detailed below.  INTERVAL HISTORY: [name] returns today for follow-up of her early stage breast cancer. She continues on letrozole, which she started in November 2016. She is tolerating it well. She does have aches and pains here and there but she has learned that going off anti-estrogens really does not make a difference to that. She is willing to continue letrozole 2 completes the scheduled 5 years.  REVIEW OF SYSTEMS: [name] saw Dr. Johnnye Sima for review of her diagnosis of possible Lyme disease. His workup was negative and she was delighted to learn she did not have that problem he then referred her to Dr. Debbora Lacrosse for rheumatologic evaluation and he was able to reassure her that she does not have rheumatoid arthritis or a similar disease. He does think that she may have fibromyalgia and he is titrating Clonopin and Flexeril to see if they can find a dose she can tolerate and that alleviates her symptoms. She is not exercising regularly but she has hired and exercise coach in [place], [place] and she is trying to learn some flexibility in strengthening exercises without injuring herself. She is worried about weight gain. She has learned from her family. That she never sleeps more than 4 hours at a stretch. Again, she still hurts in her shoulders and knees hands fingers and sometimes hips. This is not more intense or persistent than before. She has depressed but overall feels better. She is very concerned about her husband. A detailed review of systems today was otherwise noncontributory,   PAST MEDICAL HISTORY: Past Medical History  Diagnosis  Date  . Asthma     as a child, early adult...none now  . Recurrent upper respiratory infection (URI)     started 1/28.....getting better  . GERD (gastroesophageal reflux disease)     occas   at  night....twice a  month--otc meds  . Depression   . Anxiety   . Colonic mass 07/28/2011    ascending colon and found a polyp 2.5 cm adenoma - hemicoloectomy  . Breast cancer ([place]) 10/28/12    right breast  . S/P chemotherapy, time since greater than 12 weeks   . BRCA negative 06/2013    BRCA I & II negative    PAST SURGICAL HISTORY: Past Surgical History  Procedure Laterality Date  . Hemicolectomy  07/28/11  . Breast biopsy Right 10/28/12    Invasive mammary ca, ER/PR=+, Her2 Neu-  . Appendectomy    . Tubal ligation  2006  . Endometrial ablation  2006    HTA    FAMILY HISTORY Family History  Problem Relation Age of Onset  . Heart disease Father   . Cancer Paternal Aunt     unknown form of cancer  . Lung cancer Maternal Grandmother   . Testicular cancer Cousin     paternal cousin; died in 49s  . Pancreatic cancer Cousin 62  . Colon cancer Other     paternal grandmother's sister  . Cancer Other     MGM's sister   the patient's father died from congestive far to failure at the age of 83. The patient's mother is still living, in her early 20s. The patient has one brother, 4 sisters. There is no history of breast or ovarian cancer in the family to the patient's knowledge.  GYNECOLOGIC HISTORY:  Menarche age 55, first live birth age 76. The patient's less. It was September 2013 and the one before that was March 2013. She is not taking hormone replacement. She did take oral contraceptives for several years intermittently in the past. There were no complications.   SOCIAL HISTORY:  (Updated November 2016) [name] is originally from [place]. 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 She is not a counselor herself, but her husband, [name] (goes by "[name]") works as a Social worker for the [employer] as well as having his own clinic in [place]. He is originally from [place]. The patient's children are [name], who lives in [place] and has a Oceanographer in counseling; and [name], who is working in Engineer, technical sales. Both of them are now married and have moved out of the house. [name] shares custody of his daughter, who is 79 years old.    ADVANCED DIRECTIVES: In place   HEALTH MAINTENANCE: Social History  Substance Use Topics  . Smoking status: Never Smoker   . Smokeless tobacco: Never Used  . Alcohol Use: Yes     Comment: wine occasionally     Colonoscopy: 06/02/2011 / [facility]  PAP:  Bone density:  Lipid panel:  Allergies  Allergen Reactions  . Morphine And Related Itching       Filed Vitals:   08/02/15 1557  BP: 112/52  Pulse: 116  Temp: 97.8 F (36.6 C)  Resp: 18     Body mass index is 23.26 kg/(m^2).    ECOG FS: 1 Filed Weights   08/02/15 1557  Weight: 131 lb 4.8 oz (59.557 kg)   OBJECTIVE: Middle-aged white woman in no acute distress   Sclerae unicteric, pupils round and equal Oropharynx clear and  moist-- no thrush or other lesions No cervical or supraclavicular adenopathy Lungs no rales or rhonchi Heart regular rate and rhythm Abd soft, nontender, positive bowel sounds MSK no focal spinal tenderness, no upper extremity lymphedema Neuro: nonfocal, well oriented, appropriate affect Breasts: the right breast is status post lumpectomy and radiation. The cosmetic result is good. There is no evidence of local recurrence. The right axilla is benign. The left breast is unremarkable.    LAB RESULTS:   Lab Results  Component Value Date   WBC 7.2 08/02/2015   NEUTROABS 4.7 08/02/2015   HGB 12.8 08/02/2015   HCT 39.4 08/02/2015   MCV 87.2 08/02/2015   PLT 295 08/02/2015      Chemistry      Component Value Date/Time   NA 139 05/15/2015 1419   NA 139 07/30/2011 0615   K 4.3 05/15/2015 1419   K 4.1 07/30/2011 0615   CL 102 11/03/2012 0821   CL 104 07/30/2011 0615   CO2 27 05/15/2015 1419   CO2 25 07/30/2011 0615   BUN 16.7 05/15/2015 1419   BUN 5* 07/30/2011  0615   CREATININE 1.0 05/15/2015 1419   CREATININE 0.68 07/30/2011 0615      Component Value Date/Time   CALCIUM 10.0 05/15/2015 1419   CALCIUM 8.8 07/30/2011 0615   ALKPHOS 73 05/15/2015 1419   ALKPHOS 56 07/22/2011 0845   AST 28 05/15/2015 1419   AST 14 07/22/2011 0845   ALT 36 05/15/2015 1419   ALT 14 07/22/2011 0845   BILITOT 0.39 05/15/2015 1419   BILITOT 0.3 07/22/2011 0845       STUDIES: No results found.   ASSESSMENT: 55 y.o. [place] woman status post right breast lower inner quadrant biopsy 10/28/2012 for a clinical T1c N0, stage IA invasive ductal carcinoma, grade 2, estrogen and progesterone receptor positive, HER-2 not amplified, with an MIB-1 of 20%  (0) [name]'s genetics show a VUS, namely TP53 c.868C>T.    (1) s/p Right lumpectomy and sentinel lymph node sampling 11/17/2012 for a pT2 pN0, stage IIA invasive ductal carcinoma with prominent lobular features, grade 2, with initially positive  margins cleared intraoperatively; estrogen receptor positive with an Allred score of 7, progesterone receptor positive with an Allred score of 8, HercepTest 0, proliferation index by ACIS III 32% ([id number])   (2) Oncotype DX score of 33 predicting a risk of distant recurrence within 10 years of 23% if her only systemic treatment is tamoxifen for 5 years; the estrogen receptor was interpreted as negative on the Oncotype report  (3) adjuvant cyclophosphamide and docetaxel started 12/30/2012, first cycle given at [facility], with the remaining cycles given  at [facility] and completed 4th cycle 03/03/2013  (4)  adjuvant radiation therapy completed 05/10/2013  (5) started tamoxifen January 2015, stopped March 2015 due to depression  (6) anastrozole started 11/12/2013, discontinued 02/24/2014 with significant arthralgias/myalgias, resumed November 2015 with no improvement in the arthralgias myalgias, again discontinued  (7) exemestane started November 2015  (a) held 03/14/2015 because  of arthralgias and myalgias.  (8) letrozole started 05/16/2015, to be continued throuogh December 2020  (a) bone density 04/23/2015 at [place] shows osteoporosis with a T score of -2.0  PLAN:  November  is doing much better overall and feels satisfied that she does not have a major rheumatologic condition or a severe chronic infectious disease, or other debilitating diagnosis. She is accepting of a diagnosis of fibromyalgia and hopes Dr. Chipper Herb slows interventions will make that easier on her. Certainly the  plan to proceed to exercise is on the right track.  We did not discuss this phosphatase today because things are still very unstable at home and Maricsa I don't think is ready to start yet another medication that may have significant side effects.  Instead we will discuss denosumab when she returns to see me in August. Before that visit she will have her mammography with mammography at [facility].  We did discuss her husband's situation today and I'm hopeful with Dr. Casimiro Needle help they will be able to make some progress. They do have a planned visit to [place] which may service of medication for both of them.  [place] knows to call for any problems that may develop before her next visit here.  Chauncey Cruel, MD   08/02/2015 4:08 PM

## 2015-08-02 NOTE — Telephone Encounter (Signed)
appt made and as printed. [facility] appt not yet made due to order not in yet. Will schedule and make pt aware of d/t

## 2015-08-03 ENCOUNTER — Telehealth: Payer: Self-pay | Admitting: Oncology

## 2015-08-03 NOTE — Telephone Encounter (Signed)
[facility] mammogram appt st for 01/31/16 @ 1015 am. Patient aware.

## 2015-08-07 ENCOUNTER — Other Ambulatory Visit: Payer: Self-pay | Admitting: Oncology

## 2016-01-20 ENCOUNTER — Encounter: Payer: Self-pay | Admitting: Oncology

## 2016-02-11 ENCOUNTER — Other Ambulatory Visit: Payer: Self-pay | Admitting: *Deleted

## 2016-02-11 DIAGNOSIS — C50311 Malignant neoplasm of lower-inner quadrant of right female breast: Secondary | ICD-10-CM

## 2016-02-12 ENCOUNTER — Other Ambulatory Visit (HOSPITAL_BASED_OUTPATIENT_CLINIC_OR_DEPARTMENT_OTHER): Payer: 59

## 2016-02-12 ENCOUNTER — Telehealth: Payer: Self-pay | Admitting: Oncology

## 2016-02-12 ENCOUNTER — Ambulatory Visit (HOSPITAL_BASED_OUTPATIENT_CLINIC_OR_DEPARTMENT_OTHER): Payer: 59 | Admitting: Oncology

## 2016-02-12 VITALS — BP 133/80 | HR 90 | Temp 98.6°F | Resp 18 | Ht 63.0 in | Wt 134.3 lb

## 2016-02-12 DIAGNOSIS — M810 Age-related osteoporosis without current pathological fracture: Secondary | ICD-10-CM | POA: Diagnosis not present

## 2016-02-12 DIAGNOSIS — C50311 Malignant neoplasm of lower-inner quadrant of right female breast: Secondary | ICD-10-CM

## 2016-02-12 DIAGNOSIS — M797 Fibromyalgia: Secondary | ICD-10-CM | POA: Diagnosis not present

## 2016-02-12 LAB — CBC WITH DIFFERENTIAL/PLATELET
BASO%: 0.9 % (ref 0.0–2.0)
BASOS ABS: 0.1 10*3/uL (ref 0.0–0.1)
EOS%: 4.3 % (ref 0.0–7.0)
Eosinophils Absolute: 0.3 10*3/uL (ref 0.0–0.5)
HEMATOCRIT: 38.4 % (ref 34.8–46.6)
HGB: 12.6 g/dL (ref 11.6–15.9)
LYMPH#: 2.1 10*3/uL (ref 0.9–3.3)
LYMPH%: 33.1 % (ref 14.0–49.7)
MCH: 28.4 pg (ref 25.1–34.0)
MCHC: 32.8 g/dL (ref 31.5–36.0)
MCV: 86.5 fL (ref 79.5–101.0)
MONO#: 0.5 10*3/uL (ref 0.1–0.9)
MONO%: 7.7 % (ref 0.0–14.0)
NEUT%: 54 % (ref 38.4–76.8)
NEUTROS ABS: 3.4 10*3/uL (ref 1.5–6.5)
PLATELETS: 291 10*3/uL (ref 145–400)
RBC: 4.44 10*6/uL (ref 3.70–5.45)
RDW: 13.6 % (ref 11.2–14.5)
WBC: 6.3 10*3/uL (ref 3.9–10.3)

## 2016-02-12 LAB — COMPREHENSIVE METABOLIC PANEL
ALT: 25 U/L (ref 0–55)
ANION GAP: 10 meq/L (ref 3–11)
AST: 23 U/L (ref 5–34)
Albumin: 4.3 g/dL (ref 3.5–5.0)
Alkaline Phosphatase: 69 U/L (ref 40–150)
BUN: 19.3 mg/dL (ref 7.0–26.0)
CALCIUM: 10.1 mg/dL (ref 8.4–10.4)
CO2: 27 mEq/L (ref 22–29)
CREATININE: 1 mg/dL (ref 0.6–1.1)
Chloride: 104 mEq/L (ref 98–109)
EGFR: 63 mL/min/{1.73_m2} — ABNORMAL LOW (ref >90)
Glucose: 82 mg/dl (ref 70–140)
Potassium: 4.4 mEq/L (ref 3.5–5.1)
Sodium: 141 mEq/L (ref 136–145)
TOTAL PROTEIN: 7.8 g/dL (ref 6.4–8.3)

## 2016-02-12 LAB — DRAW EXTRA CLOT TUBE

## 2016-02-12 MED ORDER — IBANDRONATE SODIUM 150 MG PO TABS
150.0000 mg | ORAL_TABLET | ORAL | 4 refills | Status: DC
Start: 2016-02-12 — End: 2016-02-19

## 2016-02-12 NOTE — Telephone Encounter (Signed)
appt made and avs printed. Pt requested earlier in the month and day due to work

## 2016-02-12 NOTE — Progress Notes (Signed)
ID: [name] OB: [birth year]  MR#: [id number]  AUQ#:[id number]  PCP: Reginia Naas, MD GYN:   SURenelda Loma Ingram]; Isla Pence OTHER MD: Christene Slates, Thea Silversmith, Arta Silence, Deitra Mayo, Fortunato Curling; Hurley Cisco; Bobby Rumpf  CHIEF COMPLAINT: Estrogen receptor positive breast cancer  CURRENT TREATMENT: Letrozole  HISTORY OF PRESENT ILLNESS: From the original intake node:  [name] had a routine colonoscopy under Dr. Paulita Fujita approximately a year and a half ago, showing a right colon lesion which could not be removed intraoperatively. CT scans of the abdomen and pelvis 07/07/2011 were negative except for the question of a soft tissue lesion in the right colon, and particularly there was no adenopathy or evidence of metastatic disease. She underwent partial colectomy under Dr. Madilyn Hook 07/28/2011, and the pathology from that procedure ([id number]) showed a 3.2 cm tubular adenoma without high-grade dysplasia or malignancy. Margins were negative. 0 of 16 lymph nodes were involved.  And the patient has been working at weight loss and managed to lose approximately 25 pounds over the past year. She feels this is what allowed her to palpate a mass in her right breast earlier this month. (She had had negative mammographic screening 02/26/2012). On 10/27/2012 the patient underwent bilateral diagnostic mammography. The breasts are "extremely dense". Mammography did not show any new or worrisome finding. Right breast ultrasound however located an irregular hypoechoic mass in the area of the patient's palpable abnormality. It measured approximately 7 mm. Biopsy of this mass 10/28/2012 showed an invasive ductal carcinoma, grade 2, estrogen and progesterone receptor positive, with no HER-2 amplification, and an MIB-1 of 5%.  Bilateral breast MRI 11/01/2012 found a 1.3 cm irregular enhancing mass in the lower inner  quadrant of the right breast, with no other masses of concern in either breast and no abnormal appearing lymph nodes.  The patient's subsequent history is as detailed below.  INTERVAL HISTORY: [name] returns today for follow-up of her estrogen receptor positive breast cancer. She tolerates letrozole well. She does have some hot flashes, but "I had those before as well". She has vaginal dryness problems and she did participate in our intimacy and pelvic health program. She is using a lubricant. She obtains the drug at a good price.  REVIEW OF SYSTEMS: [name] feels hoarse in the morning. She denies reflux problems she feels forgetful. She denies unusual headaches, visual changes, nausea, vomiting, or gait imbalance. She is not exercising. She was advised by orthopedics to do swimming, but she can barely swim across swimming pool. She could do a stationary bike and she is a member of a gym but she admits she is not going . She still has aches and pains, but they are improving. She has a little difficulty getting up from a chair at times. A detailed review of systems today was otherwise stable  PAST MEDICAL HISTORY: Past Medical History:  Diagnosis Date  . Anxiety   . Asthma    as a child, early adult...none now  . BRCA negative 06/2013   BRCA I & II negative  . Breast cancer ([place]) 10/28/12   right breast  . Colonic mass 07/28/2011   ascending colon and found a polyp 2.5 cm adenoma - hemicoloectomy  . Depression   . GERD (gastroesophageal reflux disease)    occas  at  night....twice a  month--otc meds  . Recurrent upper respiratory infection (URI)    started 1/28.....getting better  . S/P chemotherapy, time  since greater than 12 weeks     PAST SURGICAL HISTORY: Past Surgical History:  Procedure Laterality Date  . APPENDECTOMY    . BREAST BIOPSY Right 10/28/12   Invasive mammary ca, ER/PR=+, Her2 Neu-  . ENDOMETRIAL ABLATION  2006   HTA  . HEMICOLECTOMY  07/28/11  . TUBAL LIGATION   2006    FAMILY HISTORY Family History  Problem Relation Age of Onset  . Heart disease Father   . Cancer Paternal Aunt     unknown form of cancer  . Lung cancer Maternal Grandmother   . Testicular cancer Cousin     paternal cousin; died in 43s  . Pancreatic cancer Cousin 62  . Colon cancer Other     paternal grandmother's sister  . Cancer Other     MGM's sister   the patient's father died from congestive far to failure at the age of 44. The patient's mother is still living, in her early 28s. The patient has one brother, 4 sisters. There is no history of breast or ovarian cancer in the family to the patient's knowledge.  GYNECOLOGIC HISTORY:  Menarche age 39, first live birth age 77. The patient's less. It was September 2013 and the one before that was March 2013. She is not taking hormone replacement. She did take oral contraceptives for several years intermittently in the past. There were no complications.   SOCIAL HISTORY:  (Updated November 2016) [name] is originally from [place]. 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 He is originally from [place]. The patient's children are [name], who lives in [place] and has a Oceanographer in counseling; and [name], who is working in Engineer, technical sales. Both of them are now married and have moved out of the house. [name] shares custody of his daughter, who is 46 years old.    ADVANCED DIRECTIVES: In place   HEALTH MAINTENANCE: Social History  Substance Use Topics  . Smoking status: Never Smoker  . Smokeless tobacco: Never Used  . Alcohol use Yes     Comment: wine occasionally     Colonoscopy: 06/02/2011 / [facility]  PAP:  Bone density:  Lipid panel:  Allergies  Allergen Reactions  . Morphine And  Related Itching       Vitals:   02/12/16 1419  BP: 133/80  Pulse: 90  Resp: 18  Temp: 98.6 F (37 C)     Body mass index is 23.79 kg/m.    ECOG FS: 1 Filed Weights   02/12/16 1419  Weight: 134 lb 4.8 oz (60.9 kg)   OBJECTIVE: Middle-aged white woman Who appears stated age  Sclerae unicteric, EOMs intact Oropharynx clear and moist No cervical or supraclavicular adenopathy Lungs no rales or rhonchi Heart regular rate and rhythm Abd soft, nontender, positive bowel sounds MSK no focal spinal tenderness, no upper extremity lymphedema Neuro: nonfocal, well oriented, appropriate affect Breasts: The right breast is status post lumpectomy and radiation. There is no evidence of local recurrence. The cosmetic result is good. The right axilla is benign. Left breast is unremarkable.    LAB RESULTS:   Lab Results  Component Value Date   WBC 6.3 02/12/2016   NEUTROABS 3.4 02/12/2016   HGB 12.6 02/12/2016   HCT 38.4 02/12/2016   MCV 86.5 02/12/2016   PLT 291  02/12/2016      Chemistry      Component Value Date/Time   NA 141 02/12/2016 1409   K 4.4 02/12/2016 1409   CL 102 11/03/2012 0821   CO2 27 02/12/2016 1409   BUN 19.3 02/12/2016 1409   CREATININE 1.0 02/12/2016 1409      Component Value Date/Time   CALCIUM 10.1 02/12/2016 1409   ALKPHOS 69 02/12/2016 1409   AST 23 02/12/2016 1409   ALT 25 02/12/2016 1409   BILITOT <0.30 02/12/2016 1409       STUDIES: Mammography at [facility] 01/31/2016 showed the breast density to be category D. There was no evidence of malignancy. This was a tomography scan as well. She also had a bone density scan last November and this showed osteoporosis with a T score of -3.0.  ASSESSMENT: 55 y.o. [place] woman status post right breast lower inner quadrant biopsy 10/28/2012 for a clinical T1c N0, stage IA invasive ductal carcinoma, grade 2, estrogen and progesterone receptor positive, HER-2 not amplified, with an MIB-1 of 20%  (0)  [name]'s genetics show a VUS, namely TP53 c.868C>T.    (1) s/p Right lumpectomy and sentinel lymph node sampling 11/17/2012 for a pT2 pN0, stage IIA invasive ductal carcinoma with prominent lobular features, grade 2, with initially positive  margins cleared intraoperatively; estrogen receptor positive with an Allred score of 7, progesterone receptor positive with an Allred score of 8, HercepTest 0, proliferation index by ACIS III 32% ([id number])   (2) Oncotype DX score of 33 predicting a risk of distant recurrence within 10 years of 23% if her only systemic treatment is tamoxifen for 5 years; the estrogen receptor was interpreted as negative on the Oncotype report  (3) adjuvant cyclophosphamide and docetaxel started 12/30/2012, first cycle given at [facility], with the remaining cycles given  at [facility] and completed 4th cycle 03/03/2013  (4)  adjuvant radiation therapy completed 05/10/2013  (5) started tamoxifen January 2015, stopped March 2015 due to depression  (6) anastrozole started 11/12/2013, discontinued 02/24/2014 with significant arthralgias/myalgias, resumed November 2015 with no improvement in the arthralgias myalgias, again discontinued  (7) exemestane started November 2015  (a) held 03/14/2015 because of arthralgias and myalgias.  (8) letrozole started 05/16/2015, to be continued throuogh December 2020  (a) bone density 04/23/2015 at [place] shows osteoporosis with a T score of -3.0  (b) ibandronate started 02/15/2016  PLAN:  Burnette is tolerating the letrozole currently with no unusual side effects. The plan will be to continue that through December 2020.  She understands that letrozole may further week in her bone density. Today we discussed bisphosphonates and she decided she would get ibandronate a try. We discussed how to take this medication and she will have her first dose September 1. If she has any problems with it she will let me know.  She and her old arthritis decide  whether she should have another breast MRI or not. Unfortunately these have not been covered and they are very expensive. She will let me know when she sees me at the next visit which will be in 6 months, February 2018.  She knows to call for any problems that may develop before that visit. Chauncey Cruel, MD   02/12/2016 3:09 PM

## 2016-02-19 ENCOUNTER — Telehealth: Payer: Self-pay | Admitting: Nurse Practitioner

## 2016-02-19 ENCOUNTER — Other Ambulatory Visit: Payer: Self-pay | Admitting: Nurse Practitioner

## 2016-02-19 MED ORDER — IBANDRONATE SODIUM 150 MG PO TABS: 150.0000 mg | ORAL_TABLET | ORAL | 4 refills | Status: DC

## 2016-02-19 NOTE — Progress Notes (Signed)
RN spoke to patient; she is in agreement to start boniva knowing her insurance will cover it. Sent to pharmacy and informed patient of this.

## 2016-02-19 NOTE — Telephone Encounter (Signed)
RN attempted to reach patient to verify she and Dr. Jana Hakim have discussed medication and she is in agreement to start. Per Dr. Jana Hakim, [place] to re-send boniva Rx to pharmacy if patient is ok with this medication.

## 2016-02-29 ENCOUNTER — Other Ambulatory Visit: Payer: Self-pay | Admitting: Oncology

## 2016-05-24 ENCOUNTER — Other Ambulatory Visit: Payer: Self-pay | Admitting: Oncology

## 2016-06-21 ENCOUNTER — Other Ambulatory Visit: Payer: Self-pay | Admitting: Nurse Practitioner

## 2016-07-28 ENCOUNTER — Other Ambulatory Visit: Payer: Self-pay | Admitting: Emergency Medicine

## 2016-07-28 DIAGNOSIS — C50311 Malignant neoplasm of lower-inner quadrant of right female breast: Secondary | ICD-10-CM

## 2016-07-29 ENCOUNTER — Ambulatory Visit (HOSPITAL_BASED_OUTPATIENT_CLINIC_OR_DEPARTMENT_OTHER): Payer: 59 | Admitting: Oncology

## 2016-07-29 ENCOUNTER — Other Ambulatory Visit (HOSPITAL_BASED_OUTPATIENT_CLINIC_OR_DEPARTMENT_OTHER): Payer: 59

## 2016-07-29 ENCOUNTER — Telehealth: Payer: Self-pay | Admitting: Oncology

## 2016-07-29 ENCOUNTER — Other Ambulatory Visit: Payer: Self-pay

## 2016-07-29 VITALS — BP 126/71 | HR 95 | Temp 98.0°F | Resp 18 | Ht 63.0 in | Wt 129.6 lb

## 2016-07-29 DIAGNOSIS — R922 Inconclusive mammogram: Secondary | ICD-10-CM

## 2016-07-29 DIAGNOSIS — M810 Age-related osteoporosis without current pathological fracture: Secondary | ICD-10-CM | POA: Diagnosis not present

## 2016-07-29 DIAGNOSIS — C50311 Malignant neoplasm of lower-inner quadrant of right female breast: Secondary | ICD-10-CM

## 2016-07-29 DIAGNOSIS — Z170 Estrogen receptor positive status [ER+]: Secondary | ICD-10-CM

## 2016-07-29 DIAGNOSIS — N951 Menopausal and female climacteric states: Secondary | ICD-10-CM

## 2016-07-29 DIAGNOSIS — N952 Postmenopausal atrophic vaginitis: Secondary | ICD-10-CM

## 2016-07-29 LAB — COMPREHENSIVE METABOLIC PANEL
ALBUMIN: 4.5 g/dL (ref 3.5–5.0)
ALK PHOS: 57 U/L (ref 40–150)
ALT: 20 U/L (ref 0–55)
AST: 20 U/L (ref 5–34)
Anion Gap: 11 mEq/L (ref 3–11)
BILIRUBIN TOTAL: 0.47 mg/dL (ref 0.20–1.20)
BUN: 18.4 mg/dL (ref 7.0–26.0)
CALCIUM: 10.2 mg/dL (ref 8.4–10.4)
CO2: 25 mEq/L (ref 22–29)
CREATININE: 1 mg/dL (ref 0.6–1.1)
Chloride: 103 mEq/L (ref 98–109)
EGFR: 64 mL/min/{1.73_m2} — ABNORMAL LOW (ref >90)
Glucose: 94 mg/dl (ref 70–140)
Potassium: 4.1 mEq/L (ref 3.5–5.1)
Sodium: 139 mEq/L (ref 136–145)
TOTAL PROTEIN: 7.6 g/dL (ref 6.4–8.3)

## 2016-07-29 LAB — CBC WITH DIFFERENTIAL/PLATELET
BASO%: 0.6 % (ref 0.0–2.0)
Basophils Absolute: 0 10*3/uL (ref 0.0–0.1)
EOS%: 3.5 % (ref 0.0–7.0)
Eosinophils Absolute: 0.3 10*3/uL (ref 0.0–0.5)
HEMATOCRIT: 39.2 % (ref 34.8–46.6)
HEMOGLOBIN: 13.1 g/dL (ref 11.6–15.9)
LYMPH#: 2.7 10*3/uL (ref 0.9–3.3)
LYMPH%: 37.5 % (ref 14.0–49.7)
MCH: 28.6 pg (ref 25.1–34.0)
MCHC: 33.4 g/dL (ref 31.5–36.0)
MCV: 85.6 fL (ref 79.5–101.0)
MONO#: 0.7 10*3/uL (ref 0.1–0.9)
MONO%: 9.4 % (ref 0.0–14.0)
NEUT#: 3.5 10*3/uL (ref 1.5–6.5)
NEUT%: 49 % (ref 38.4–76.8)
PLATELETS: 283 10*3/uL (ref 145–400)
RBC: 4.58 10*6/uL (ref 3.70–5.45)
RDW: 13.7 % (ref 11.2–14.5)
WBC: 7.1 10*3/uL (ref 3.9–10.3)

## 2016-07-29 MED ORDER — GABAPENTIN 300 MG PO CAPS: 300.0000 mg | ORAL_CAPSULE | Freq: Every day | ORAL | 4 refills | Status: DC

## 2016-07-29 NOTE — Telephone Encounter (Signed)
Per Terri/desk nurse, she will fax over Rx for MRI to the facility in [place], which was requested by patient. Patient is aware. 07/29/16

## 2016-07-29 NOTE — Telephone Encounter (Signed)
Appointments scheduled per 07/29/16 los. Patient was given a copy of the AVS report and appointment schedule per 07/29/16 los.

## 2016-07-29 NOTE — Addendum Note (Signed)
Addended by: Prentiss Bells on: 07/29/2016 11:22 AM   Modules accepted: Orders

## 2016-07-29 NOTE — Progress Notes (Signed)
ID: [name] OB: 09-Dec-1960  MR#: [id number]  CSN#:[id number]  PCP: Reginia Naas, MD GYN:   SURenelda Loma Ingram]; Isla Pence OTHER MD: Christene Slates, Thea Silversmith, Arta Silence, Deitra Mayo, Fortunato Curling; Hurley Cisco; Bobby Rumpf  CHIEF COMPLAINT: Estrogen receptor positive breast cancer  CURRENT TREATMENT: Letrozole  HISTORY OF PRESENT ILLNESS: From the original intake node:  [name] had a routine colonoscopy under Dr. Paulita Fujita approximately a year and a half ago, showing a right colon lesion which could not be removed intraoperatively. CT scans of the abdomen and pelvis 07/07/2011 were negative except for the question of a soft tissue lesion in the right colon, and particularly there was no adenopathy or evidence of metastatic disease. She underwent partial colectomy under Dr. Madilyn Hook 07/28/2011, and the pathology from that procedure ([id number]) showed a 3.2 cm tubular adenoma without high-grade dysplasia or malignancy. Margins were negative. 0 of 16 lymph nodes were involved.  And the patient has been working at weight loss and managed to lose approximately 25 pounds over the past year. She feels this is what allowed her to palpate a mass in her right breast earlier this month. (She had had negative mammographic screening 02/26/2012). On 10/27/2012 the patient underwent bilateral diagnostic mammography. The breasts are "extremely dense". Mammography did not show any new or worrisome finding. Right breast ultrasound however located an irregular hypoechoic mass in the area of the patient's palpable abnormality. It measured approximately 7 mm. Biopsy of this mass 10/28/2012 showed an invasive ductal carcinoma, grade 2, estrogen and progesterone receptor positive, with no HER-2 amplification, and an MIB-1 of 5%.  Bilateral breast MRI 11/01/2012 found a 1.3 cm irregular enhancing mass in the lower inner  quadrant of the right breast, with no other masses of concern in either breast and no abnormal appearing lymph nodes.  The patient's subsequent history is as detailed below.  INTERVAL HISTORY: [name] returns today for follow-up of her estrogen receptor positive breast cancer. She continues on letrozole with fair tolerance. She does have hot flashes but she manages. She keeps air-conditioning on at home. She also has vaginal dryness problems. She has been using a water-based gel for this. She is considering coconut oil as well.   They have moved to a new house, which is smaller. Both children are in town. They are expecting a second child from their son's side of the family in May.  REVIEW OF SYSTEMS: [name] has some stress related to her job, which is changing. She also feels a different sensation in the upper inner part of her right breast, which can be like a burning feeling at times. There is nothing obviously palpable. Aside from these issues a detailed review of systems today was stable.  PAST MEDICAL HISTORY: Past Medical History:  Diagnosis Date  . Anxiety   . Asthma    as a child, early adult...none now  . BRCA negative 06/2013   BRCA I & II negative  . Breast cancer ([place]) 10/28/12   right breast  . Colonic mass 07/28/2011   ascending colon and found a polyp 2.5 cm adenoma - hemicoloectomy  . Depression   . GERD (gastroesophageal reflux disease)    occas  at  night....twice a  month--otc meds  . Recurrent upper respiratory infection (URI)    started 1/28.....getting better  . S/P chemotherapy, time since greater than 12 weeks     PAST SURGICAL HISTORY: Past Surgical History:  Procedure Laterality Date  . APPENDECTOMY    . BREAST BIOPSY Right 10/28/12   Invasive mammary ca, ER/PR=+, Her2 Neu-  . ENDOMETRIAL ABLATION  2006   HTA  . HEMICOLECTOMY  07/28/11  . TUBAL LIGATION  2006    FAMILY HISTORY Family History  Problem Relation Age of Onset  . Heart disease Father    . Cancer Paternal Aunt     unknown form of cancer  . Lung cancer Maternal Grandmother   . Testicular cancer Cousin     paternal cousin; died in 2s  . Pancreatic cancer Cousin 71  . Colon cancer Other     paternal grandmother's sister  . Cancer Other     MGM's sister   the patient's father died from congestive far to failure at the age of 43. The patient's mother is still living, in her early 56s. The patient has one brother, 4 sisters. There is no history of breast or ovarian cancer in the family to the patient's knowledge.  GYNECOLOGIC HISTORY:  Menarche age 78, first live birth age 31. The patient's less. It was September 2013 and the one before that was March 2013. She is not taking hormone replacement. She did take oral contraceptives for several years intermittently in the past. There were no complications.   SOCIAL HISTORY:  (Updated November 2016) [name] is originally from [place]. 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 She is not a counselor herself, but her husband, [name] (goes by "[name]") works as a Social worker for the [employer] as well as having his own clinic in [place]. He is originally from [place]. The patient's children are [name], who lives in [place] and has a Oceanographer in counseling; and [name], who is working in Engineer, technical sales. Both of them are now married and have moved out of the house. [name] shares custody of his daughter, who is 51 years old.    ADVANCED DIRECTIVES: In place   HEALTH MAINTENANCE: Social History  Substance Use Topics  . Smoking status: Never Smoker  . Smokeless tobacco: Never Used  . Alcohol use Yes     Comment: wine occasionally     Colonoscopy: 06/02/2011 / [facility]  PAP:  Bone density:  Lipid panel:  Allergies  Allergen Reactions  . Morphine And Related Itching       Vitals:   07/29/16 0927  BP: 126/71  Pulse: 95  Resp: 18  Temp: 98 F  (36.7 C)     Body mass index is 22.96 kg/m.    ECOG FS: 1 Filed Weights   07/29/16 0927  Weight: 129 lb 9.6 oz (58.8 kg)   OBJECTIVE: Middle-aged white woman in no acute distress  Sclerae unicteric, pupils round and equal Oropharynx clear and moist-- no thrush or other lesions No cervical or supraclavicular adenopathy Lungs no rales or rhonchi Heart regular rate and rhythm Abd soft, nontender, positive bowel sounds MSK no focal spinal tenderness, no upper extremity lymphedema Neuro: nonfocal, well oriented, appropriate affect Breasts: The right breast is status post lumpectomy and radiation. There is mild distortion of the breast contour as previously noted. I do not palpate anything unusual in the upper inner aspect of the right breast or between the breasts. Both axillae are benign. The left breast is unremarkable  LAB RESULTS:   Lab Results  Component Value Date   WBC 7.1 07/29/2016   NEUTROABS 3.5 07/29/2016   HGB 13.1 07/29/2016   HCT 39.2 07/29/2016  MCV 85.6 07/29/2016   PLT 283 07/29/2016      Chemistry      Component Value Date/Time   NA 141 02/12/2016 1409   K 4.4 02/12/2016 1409   CL 102 11/03/2012 0821   CO2 27 02/12/2016 1409   BUN 19.3 02/12/2016 1409   CREATININE 1.0 02/12/2016 1409      Component Value Date/Time   CALCIUM 10.1 02/12/2016 1409   ALKPHOS 69 02/12/2016 1409   AST 23 02/12/2016 1409   ALT 25 02/12/2016 1409   BILITOT <0.30 02/12/2016 1409       STUDIES: Mammography at [facility] 01/31/2016 showed the breast density to be category D. There was no evidence of malignancy. This was a tomography scan as well. She also had a bone density scan last November and this showed osteoporosis with a T score of -3.0.  ASSESSMENT: 56 y.o. [place] woman status post right breast lower inner quadrant biopsy 10/28/2012 for a clinical T1c N0, stage IA invasive ductal carcinoma, grade 2, estrogen and progesterone receptor positive, HER-2 not amplified,  with an MIB-1 of 20%  (0) Tomiko's genetics show a VUS, namely TP53 c.868C>T.    (1) s/p Right lumpectomy and sentinel lymph node sampling 11/17/2012 for a pT2 pN0, stage IIA invasive ductal carcinoma with prominent lobular features, grade 2, with initially positive  margins cleared intraoperatively; estrogen receptor positive with an Allred score of 7, progesterone receptor positive with an Allred score of 8, HercepTest 0, proliferation index by ACIS III 32% ([id number])   (2) Oncotype DX score of 33 predicting a risk of distant recurrence within 10 years of 23% if her only systemic treatment is tamoxifen for 5 years; the estrogen receptor was interpreted as negative on the Oncotype report  (3) adjuvant cyclophosphamide and docetaxel started 12/30/2012, first cycle given at [facility], with the remaining cycles given  at [facility] and completed 4th cycle 03/03/2013  (4)  adjuvant radiation therapy completed 05/10/2013  (5) started tamoxifen January 2015, stopped March 2015 due to depression  (6) anastrozole started 11/12/2013, discontinued 02/24/2014 with significant arthralgias/myalgias, resumed November 2015 with no improvement in the arthralgias myalgias, again discontinued  (7) exemestane started November 2015  (a) held 03/14/2015 because of arthralgias and myalgias.  (8) letrozole started 05/16/2015, to be continued throuogh December 2020  (a) bone density 04/23/2015 at [facility] shows osteoporosis with a T score of -3.0  (b) ibandronate started 02/15/2016  PLAN:  Tonni is now 3-1/2 years out from definitive surgery for her breast cancer with no evidence of disease recurrence. His is very favorable.  She continues on letrozole with good tolerance. We discussed hot flashes and vaginal dryness management today in detail.  She continues to have dense breasts. We're going to obtain an MRI in [place] this month. She will then have her mammography in August, together with tomography, and I  will see her shortly after that test.  The plan at this point is to continue letrozole at least through December 2020 with consideration of 2 additional years.  She knows to call for any problems that may develop before the next visit here. Chauncey Cruel, MD   07/29/2016 9:32 AM

## 2016-07-29 NOTE — Progress Notes (Signed)
Order entered for MRI and faxed to MRI at [facility]

## 2016-12-20 ENCOUNTER — Other Ambulatory Visit: Payer: Self-pay | Admitting: Oncology

## 2017-02-06 ENCOUNTER — Other Ambulatory Visit: Payer: Self-pay | Admitting: *Deleted

## 2017-02-06 DIAGNOSIS — Z170 Estrogen receptor positive status [ER+]: Principal | ICD-10-CM

## 2017-02-06 DIAGNOSIS — C50311 Malignant neoplasm of lower-inner quadrant of right female breast: Secondary | ICD-10-CM

## 2017-02-09 ENCOUNTER — Telehealth: Payer: Self-pay

## 2017-02-09 ENCOUNTER — Ambulatory Visit (HOSPITAL_BASED_OUTPATIENT_CLINIC_OR_DEPARTMENT_OTHER): Payer: 59 | Admitting: Oncology

## 2017-02-09 ENCOUNTER — Other Ambulatory Visit (HOSPITAL_BASED_OUTPATIENT_CLINIC_OR_DEPARTMENT_OTHER): Payer: 59

## 2017-02-09 VITALS — BP 130/67 | HR 98 | Temp 98.9°F | Resp 20 | Ht 63.0 in | Wt 134.0 lb

## 2017-02-09 DIAGNOSIS — M810 Age-related osteoporosis without current pathological fracture: Secondary | ICD-10-CM

## 2017-02-09 DIAGNOSIS — M818 Other osteoporosis without current pathological fracture: Secondary | ICD-10-CM

## 2017-02-09 DIAGNOSIS — C50311 Malignant neoplasm of lower-inner quadrant of right female breast: Secondary | ICD-10-CM

## 2017-02-09 DIAGNOSIS — Z170 Estrogen receptor positive status [ER+]: Principal | ICD-10-CM

## 2017-02-09 DIAGNOSIS — F329 Major depressive disorder, single episode, unspecified: Secondary | ICD-10-CM | POA: Diagnosis not present

## 2017-02-09 DIAGNOSIS — R32 Unspecified urinary incontinence: Secondary | ICD-10-CM

## 2017-02-09 LAB — CBC WITH DIFFERENTIAL/PLATELET
BASO%: 0.8 % (ref 0.0–2.0)
BASOS ABS: 0.1 10*3/uL (ref 0.0–0.1)
EOS%: 1.6 % (ref 0.0–7.0)
Eosinophils Absolute: 0.1 10*3/uL (ref 0.0–0.5)
HEMATOCRIT: 38.8 % (ref 34.8–46.6)
HGB: 12.6 g/dL (ref 11.6–15.9)
LYMPH%: 24.5 % (ref 14.0–49.7)
MCH: 28.5 pg (ref 25.1–34.0)
MCHC: 32.6 g/dL (ref 31.5–36.0)
MCV: 87.4 fL (ref 79.5–101.0)
MONO#: 0.5 10*3/uL (ref 0.1–0.9)
MONO%: 7.5 % (ref 0.0–14.0)
NEUT#: 4.3 10*3/uL (ref 1.5–6.5)
NEUT%: 65.6 % (ref 38.4–76.8)
Platelets: 295 10*3/uL (ref 145–400)
RBC: 4.43 10*6/uL (ref 3.70–5.45)
RDW: 14.2 % (ref 11.2–14.5)
WBC: 6.6 10*3/uL (ref 3.9–10.3)
lymph#: 1.6 10*3/uL (ref 0.9–3.3)

## 2017-02-09 LAB — COMPREHENSIVE METABOLIC PANEL
ALBUMIN: 4.1 g/dL (ref 3.5–5.0)
ALK PHOS: 60 U/L (ref 40–150)
ALT: 14 U/L (ref 0–55)
AST: 17 U/L (ref 5–34)
Anion Gap: 9 mEq/L (ref 3–11)
BUN: 15.5 mg/dL (ref 7.0–26.0)
CALCIUM: 9.9 mg/dL (ref 8.4–10.4)
CO2: 26 mEq/L (ref 22–29)
Chloride: 104 mEq/L (ref 98–109)
Creatinine: 0.9 mg/dL (ref 0.6–1.1)
EGFR: 72 mL/min/{1.73_m2} — AB (ref >90)
Glucose: 104 mg/dl (ref 70–140)
POTASSIUM: 4 meq/L (ref 3.5–5.1)
Sodium: 139 mEq/L (ref 136–145)
Total Bilirubin: 0.45 mg/dL (ref 0.20–1.20)
Total Protein: 7.4 g/dL (ref 6.4–8.3)

## 2017-02-09 MED ORDER — IBANDRONATE SODIUM 150 MG PO TABS: 150.0000 mg | ORAL_TABLET | ORAL | 4 refills | Status: DC

## 2017-02-09 MED ORDER — LETROZOLE 2.5 MG PO TABS: 2.5000 mg | ORAL_TABLET | Freq: Every day | ORAL | 3 refills | Status: DC

## 2017-02-09 NOTE — Telephone Encounter (Signed)
appts made and avs printed per 8/27 los and de[facility] scheduled [facility] 05/19/17 8;00 and order has been faxed  Sharian Delia

## 2017-02-09 NOTE — Progress Notes (Signed)
ID: [name] OB: 01-21-1961  MR#: [id number]  QTM#:[id number]  PCP: Carol Ada, MD GYN:   SURenelda Loma Ingram]; Isla Pence OTHER MD: Christene Slates, Thea Silversmith, Arta Silence, Deitra Mayo, Fortunato Curling; Hurley Cisco; Bobby Rumpf  CHIEF COMPLAINT: Estrogen receptor positive breast cancer  CURRENT TREATMENT: Letrozole, ibandronate  HISTORY OF PRESENT ILLNESS: From the original intake node:  [name] had a routine colonoscopy under Dr. Paulita Fujita approximately a year and a half ago, showing a right colon lesion which could not be removed intraoperatively. CT scans of the abdomen and pelvis 07/07/2011 were negative except for the question of a soft tissue lesion in the right colon, and particularly there was no adenopathy or evidence of metastatic disease. She underwent partial colectomy under Dr. Madilyn Hook 07/28/2011, and the pathology from that procedure ([id number]) showed a 3.2 cm tubular adenoma without high-grade dysplasia or malignancy. Margins were negative. 0 of 16 lymph nodes were involved.  And the patient has been working at weight loss and managed to lose approximately 25 pounds over the past year. She feels this is what allowed her to palpate a mass in her right breast earlier this month. (She had had negative mammographic screening 02/26/2012). On 10/27/2012 the patient underwent bilateral diagnostic mammography. The breasts are "extremely dense". Mammography did not show any new or worrisome finding. Right breast ultrasound however located an irregular hypoechoic mass in the area of the patient's palpable abnormality. It measured approximately 7 mm. Biopsy of this mass 10/28/2012 showed an invasive ductal carcinoma, grade 2, estrogen and progesterone receptor positive, with no HER-2 amplification, and an MIB-1 of 5%.  Bilateral breast MRI 11/01/2012 found a 1.3 cm irregular enhancing mass in the lower  inner quadrant of the right breast, with no other masses of concern in either breast and no abnormal appearing lymph nodes.  The patient's subsequent history is as detailed below.  INTERVAL HISTORY: [name] returns today for follow-up and treatment of her estrogen receptor positive breast cancer. She continues on letrozole, generally with good tolerance. Hot flashes have pretty much gone away. She has arthritis in her knees but not significantly elsewhere. Vaginal dryness is not a significant problem. She obtains a drug at a good price.  Mammography this year continues to show breast density category D. There was no evidence of disease recurrence.  REVIEW OF SYSTEMS: [name] is under a great deal of stress at present. She and her husband are considering separation and eventual divorced. 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 Aside from that she continues to have problems with stress urinary incontinence, forgetfulness, and depression. A detailed review of systems today was otherwise stable  PAST MEDICAL HISTORY: Past Medical History:  Diagnosis Date  . Anxiety   . Asthma    as a child, early adult...none now  . BRCA negative 06/2013   BRCA I & II negative  . Breast cancer ([facility]) 10/28/12   right breast  . Colonic mass 07/28/2011   ascending colon and found a polyp 2.5 cm adenoma - hemicoloectomy  . Depression   . GERD (gastroesophageal reflux disease)    occas  at  night....twice a  month--otc meds  . Recurrent upper respiratory infection (URI)    started 1/28.....getting better  . S/P chemotherapy, time since greater than 12 weeks     PAST SURGICAL HISTORY: Past Surgical History:  Procedure Laterality Date  .  APPENDECTOMY    . BREAST BIOPSY Right 10/28/12   Invasive mammary ca, ER/PR=+, Her2 Neu-  . ENDOMETRIAL ABLATION  2006   HTA  . HEMICOLECTOMY  07/28/11  . TUBAL LIGATION  2006    FAMILY HISTORY Family History   Problem Relation Age of Onset  . Heart disease Father   . Cancer Paternal Aunt        unknown form of cancer  . Lung cancer Maternal Grandmother   . Testicular cancer Cousin        paternal cousin; died in 86s  . Pancreatic cancer Cousin 82  . Colon cancer Other        paternal grandmother's sister  . Cancer Other        MGM's sister   the patient's father died from congestive far to failure at the age of 98. The patient's mother is still living, in her early 46s. The patient has one brother, 4 sisters. There is no history of breast or ovarian cancer in the family to the patient's knowledge.  GYNECOLOGIC HISTORY:  Menarche age 28, first live birth age 55. The patient's less. It was September 2013 and the one before that was March 2013. She is not taking hormone replacement. She did take oral contraceptives for several years intermittently in the past. There were no complications.   SOCIAL HISTORY:  (Updated August 2018) [name] is originally from [place]. 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 She is not a counselor herself, but her husband, Mayara Paulson (goes by "[name]") works as a Social worker for the [employer] as well as having his own clinic in [place]. He is originally from [place]. As of August 2018 they are in process of separation. The patient's children are [name], who lives in [place] and has a Oceanographer in counseling; and Cannelton Cellar, who is working in Engineer, technical sales. Both of them are now married and have moved out of the house. The patient has 2 grandchildren. Talihina Cellar shares custody of his daughter, who is 84 years old.    ADVANCED DIRECTIVES: In place   HEALTH MAINTENANCE: Social History  Substance Use Topics  . Smoking status: Never Smoker  . Smokeless tobacco: Never Used  . Alcohol use Yes     Comment: wine occasionally     Colonoscopy: 06/02/2011 / [facility]  PAP:  Bone density:  Lipid  panel:  Allergies  Allergen Reactions  . Morphine And Related Itching       Vitals:   02/09/17 0921  BP: 130/67  Pulse: 98  Resp: 20  Temp: 98.9 F (37.2 C)  SpO2: 100%     Body mass index is 23.74 kg/m.    ECOG FS: 1 Filed Weights   02/09/17 0921  Weight: 134 lb (60.8 kg)   OBJECTIVE: Middle-aged white woman who appears well  Sclerae unicteric, EOMs intact Oropharynx clear and moist No cervical or supraclavicular adenopathy Lungs no rales or rhonchi Heart regular rate and rhythm Abd soft, nontender, positive bowel sounds MSK no focal spinal tenderness, no upper extremity lymphedema Neuro: nonfocal, well oriented, appropriate affect Breasts: The right breast has undergone lumpectomy and radiation. There is mild distortion of the contour, and some sensitivity in the upper inner quadrant but no evidence of disease recurrence. The left breast is benign. Both axillae are benign.  LAB RESULTS:   Lab Results  Component Value Date   WBC 6.6 02/09/2017   NEUTROABS 4.3 02/09/2017   HGB 12.6 02/09/2017  HCT 38.8 02/09/2017   MCV 87.4 02/09/2017   PLT 295 02/09/2017      Chemistry      Component Value Date/Time   NA 139 07/29/2016 0905   K 4.1 07/29/2016 0905   CL 102 11/03/2012 0821   CO2 25 07/29/2016 0905   BUN 18.4 07/29/2016 0905   CREATININE 1.0 07/29/2016 0905      Component Value Date/Time   CALCIUM 10.2 07/29/2016 0905   ALKPHOS 57 07/29/2016 0905   AST 20 07/29/2016 0905   ALT 20 07/29/2016 0905   BILITOT 0.47 07/29/2016 0905       STUDIES: Mammography at [facility] 02/03/2017 found the breast density to be category D. There was no mammographic evidence of malignancy.   ASSESSMENT: 56 y.o. [place] woman status post right breast lower inner quadrant biopsy 10/28/2012 for a clinical T1c N0, stage IA invasive ductal carcinoma, grade 2, estrogen and progesterone receptor positive, HER-2 not amplified, with an MIB-1 of 20%  (0) [name]'s genetics show  a VUS, namely TP53 c.868C>T.    (1) s/p Right lumpectomy and sentinel lymph node sampling 11/17/2012 for a pT2 pN0, stage IIA invasive ductal carcinoma with prominent lobular features, grade 2, with initially positive  margins cleared intraoperatively; estrogen receptor positive with an Allred score of 7, progesterone receptor positive with an Allred score of 8, HercepTest 0, proliferation index by ACIS III 32% ([id number])   (2) Oncotype DX score of 33 predicting a risk of distant recurrence within 10 years of 23% if her only systemic treatment is tamoxifen for 5 years; the estrogen receptor was interpreted as negative on the Oncotype report  (3) adjuvant cyclophosphamide and docetaxel started 12/30/2012, first cycle given at [facility], with the remaining cycles given  at [facility] and completed 4th cycle 03/03/2013  (4)  adjuvant radiation therapy completed 05/10/2013  (5) started tamoxifen January 2015, stopped March 2015 due to depression  (6) anastrozole started 11/12/2013, discontinued 02/24/2014 with significant arthralgias/myalgias, resumed November 2015 with no improvement in the arthralgias myalgias, again discontinued  (7) exemestane started November 2015  (a) held 03/14/2015 because of arthralgias and myalgias.  (8) letrozole started 05/16/2015, to be continued throuogh December 2020  (a) bone density 04/23/2015 at [place] shows osteoporosis with a T score of -3.0  (b) ibandronate started 02/15/2016  PLAN:  Sanjana is now just over 4 years out from definitive surgery for her breast cancer with no evidence of disease recurrence. This is very favorable.  She is tolerating the letrozole much better and the plan is to continue that through December 2020 at a minimum, with consideration of 2 additional years beyond that.  Her breasts are very dense and this is worrisome in terms of possibly occult disease. I wrote her an order for breast MRI. She is going to try to get that done in [place], where it is less expensive.  She is due for repeat bone density late this year.  As noted above she is going through a difficult transition at present. She will call us with any problems that we can help with. Otherwise I will see her again in 6 months.   Chauncey Cruel, MD   02/09/2017 9:45 AM

## 2017-02-24 ENCOUNTER — Encounter: Payer: Self-pay | Admitting: Oncology

## 2017-03-06 ENCOUNTER — Encounter: Payer: Self-pay | Admitting: Oncology

## 2017-03-09 ENCOUNTER — Other Ambulatory Visit: Payer: Self-pay | Admitting: *Deleted

## 2017-03-09 MED ORDER — IBANDRONATE SODIUM 150 MG PO TABS: 150.0000 mg | ORAL_TABLET | ORAL | 4 refills | Status: DC

## 2017-03-11 ENCOUNTER — Other Ambulatory Visit: Payer: Self-pay | Admitting: *Deleted

## 2017-03-11 DIAGNOSIS — Z170 Estrogen receptor positive status [ER+]: Principal | ICD-10-CM

## 2017-03-11 DIAGNOSIS — C50311 Malignant neoplasm of lower-inner quadrant of right female breast: Secondary | ICD-10-CM

## 2017-03-13 ENCOUNTER — Other Ambulatory Visit: Payer: Self-pay | Admitting: *Deleted

## 2017-03-13 MED ORDER — IBANDRONATE SODIUM 150 MG PO TABS: 150.0000 mg | ORAL_TABLET | ORAL | 4 refills | Status: DC

## 2017-03-14 ENCOUNTER — Encounter: Payer: Self-pay | Admitting: Oncology

## 2017-03-18 ENCOUNTER — Other Ambulatory Visit: Payer: Self-pay

## 2017-03-18 DIAGNOSIS — C50311 Malignant neoplasm of lower-inner quadrant of right female breast: Secondary | ICD-10-CM

## 2017-03-18 DIAGNOSIS — Z170 Estrogen receptor positive status [ER+]: Principal | ICD-10-CM

## 2017-03-24 ENCOUNTER — Other Ambulatory Visit: Payer: Self-pay | Admitting: *Deleted

## 2017-04-04 ENCOUNTER — Other Ambulatory Visit: Payer: 59

## 2017-04-14 ENCOUNTER — Other Ambulatory Visit: Payer: Self-pay | Admitting: *Deleted

## 2017-04-14 ENCOUNTER — Telehealth: Payer: Self-pay | Admitting: *Deleted

## 2017-04-14 NOTE — Telephone Encounter (Signed)
At 11 pmThis RN was requested to  contact pt's insurance per need for additional clinical for approval of MRI scheduled for 830 am tomorrow.  Per phone communication was informed policy only accepts additional clinical via upload or fax- nurse case manager would not accept to take any clinical data from this RN.  Fax number given.  This RN attempted to reach managed care per above - with no answer - went directly to VM.  This RN then contacted the [facility] and informed them of need to reschedule- they will contact the patient.  This RN then called and informed the patient with apology given due to lateness of notification. Pt very appreciative and understands the [facility] will contact her to reschedule.

## 2017-04-15 ENCOUNTER — Inpatient Hospital Stay: Admission: RE | Admit: 2017-04-15 | Payer: 59 | Source: Ambulatory Visit

## 2017-04-20 ENCOUNTER — Telehealth: Payer: Self-pay | Admitting: *Deleted

## 2017-04-20 NOTE — Telephone Encounter (Signed)
-----   Message from Gaspar Bidding sent at 04/20/2017  1:28 PM EST ----- Regarding: MRI Breast MRI Breast approved with [facility] imagine, expired 05/29/2017.  Thank you, Darlena

## 2017-04-20 NOTE — Telephone Encounter (Signed)
This RN contacted [facility] and informed them per our managed care dept - authorization for MRI of breast has been obtained and is valid until 05/29/2017. Per Zigmund Daniel at [facility] imaging - scheduling will contact the patient.  This RN also contacted the patient and obtained identified VM- message left stating above.

## 2017-05-05 ENCOUNTER — Ambulatory Visit
Admission: RE | Admit: 2017-05-05 | Discharge: 2017-05-05 | Disposition: A | Discharge status: Home / Self Care | Payer: 59 | Source: Ambulatory Visit | Attending: Oncology | Admitting: Oncology

## 2017-05-05 DIAGNOSIS — C50311 Malignant neoplasm of lower-inner quadrant of right female breast: Secondary | ICD-10-CM

## 2017-05-05 DIAGNOSIS — Z170 Estrogen receptor positive status [ER+]: Principal | ICD-10-CM

## 2017-05-05 MED ORDER — GADOBENATE DIMEGLUMINE 529 MG/ML IV SOLN
12.0000 mL | Freq: Once | INTRAVENOUS | Status: AC | PRN
  Administered 2017-05-05: 12 mL via INTRAVENOUS

## 2017-05-06 ENCOUNTER — Other Ambulatory Visit: Payer: Self-pay | Admitting: *Deleted

## 2017-05-06 MED ORDER — LETROZOLE 2.5 MG PO TABS: 2.5000 mg | ORAL_TABLET | Freq: Every day | ORAL | 3 refills | Status: DC

## 2017-05-19 ENCOUNTER — Encounter: Payer: Self-pay | Admitting: Oncology

## 2017-08-07 NOTE — Progress Notes (Signed)
ID: [name] OB: 1960-11-02  MR#: [id number]  FKC#:[id number]  PCP: Carol Ada, MD GYN:   SURenelda Loma Ingram]; Isla Pence OTHER MD: Christene Slates, Thea Silversmith, Arta Silence, Deitra Mayo, Fortunato Curling; Hurley Cisco; Bobby Rumpf  CHIEF COMPLAINT: Estrogen receptor positive breast cancer  CURRENT TREATMENT: Letrozole, zolendronate  HISTORY OF PRESENT ILLNESS: From the original intake node:  [name] had a routine colonoscopy under Dr. Paulita Fujita approximately a year and a half ago, showing a right colon lesion which could not be removed intraoperatively. CT scans of the abdomen and pelvis 07/07/2011 were negative except for the question of a soft tissue lesion in the right colon, and particularly there was no adenopathy or evidence of metastatic disease. She underwent partial colectomy under Dr. Madilyn Hook 07/28/2011, and the pathology from that procedure ([id number]) showed a 3.2 cm tubular adenoma without high-grade dysplasia or malignancy. Margins were negative. 0 of 16 lymph nodes were involved.  And the patient has been working at weight loss and managed to lose approximately 25 pounds over the past year. She feels this is what allowed her to palpate a mass in her right breast earlier this month. (She had had negative mammographic screening 02/26/2012). On 10/27/2012 the patient underwent bilateral diagnostic mammography. The breasts are "extremely dense". Mammography did not show any new or worrisome finding. Right breast ultrasound however located an irregular hypoechoic mass in the area of the patient's palpable abnormality. It measured approximately 7 mm. Biopsy of this mass 10/28/2012 showed an invasive ductal carcinoma, grade 2, estrogen and progesterone receptor positive, with no HER-2 amplification, and an MIB-1 of 5%.  Bilateral breast MRI 11/01/2012 found a 1.3 cm irregular enhancing mass in the  lower inner quadrant of the right breast, with no other masses of concern in either breast and no abnormal appearing lymph nodes.  The patient's subsequent history is as detailed below.  INTERVAL HISTORY: [name] returns today for follow-up and treatment of her estrogen receptor positive breast cancer. She continues on letrozole, with good tolerance. She notes a decrease in her frequency of hot flashes, and she uses a fan to cool off. She also notes some vaginal dryness, but this is not a major issue.   She also continues on ibandronate, with good tolerance.  Since her last visit, she completed a bilateral breast MRI on 05/05/2017 showing no malignancy in either breast.  Her bone density on 05/19/2017 at [facility] showed a T score of -3.1 osteoporosis.   REVIEW OF SYSTEMS: [name] reports that she completed a bone density at [facility] last week, and she is concerned that it showed osteoporosis. She notes that she measures half an inch shorter in height. For exercise she counts her steps at work. She notes that she walks about 3,000- 5,000 steps per day. She notes that she goes to [employer]. Despite her efforts, she note that she gained a little weight. She notes that she paid over $1,500 out of pocket for her MRI. She denies unusual headaches, visual changes, nausea, vomiting, or dizziness. There has been no unusual cough, phlegm production, or pleurisy. This been no change in bowel or bladder habits. She denies unexplained fatigue or unexplained weight loss, bleeding, rash, or fever. A detailed review of systems was otherwise stable.    PAST MEDICAL HISTORY: Past Medical History:  Diagnosis Date  . Anxiety   . Asthma    as a child, early adult...none now  . BRCA  negative 06/2013   BRCA I & II negative  . Breast cancer ([place]) 10/28/12   right breast  . Colonic mass 07/28/2011   ascending colon and found a polyp 2.5 cm adenoma - hemicoloectomy  . Depression   . GERD (gastroesophageal reflux  disease)    occas  at  night....twice a  month--otc meds  . Recurrent upper respiratory infection (URI)    started 1/28.....getting better  . S/P chemotherapy, time since greater than 12 weeks     PAST SURGICAL HISTORY: Past Surgical History:  Procedure Laterality Date  . APPENDECTOMY    . BREAST BIOPSY Right 10/28/12   Invasive mammary ca, ER/PR=+, Her2 Neu-  . ENDOMETRIAL ABLATION  2006   HTA  . HEMICOLECTOMY  07/28/11  . TUBAL LIGATION  2006    FAMILY HISTORY Family History  Problem Relation Age of Onset  . Heart disease Father   . Cancer Paternal Aunt        unknown form of cancer  . Lung cancer Maternal Grandmother   . Testicular cancer Cousin        paternal cousin; died in 15s  . Pancreatic cancer Cousin 64  . Colon cancer Other        paternal grandmother's sister  . Cancer Other        MGM's sister   the patient's father died from congestive far to failure at the age of 72. The patient's mother is still living, in her early 62s. The patient has one brother, 4 sisters. There is no history of breast or ovarian cancer in the family to the patient's knowledge.  GYNECOLOGIC HISTORY:  Menarche age 89, first live birth age 28. The patient's less. It was September 2013 and the one before that was March 2013. She is not taking hormone replacement. She did take oral contraceptives for several years intermittently in the past. There were no complications.   SOCIAL HISTORY:  (Updated August 2018) Steve is originally from [place]. 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 She is not a counselor herself, but her husband, Dariane Natzke (goes by "[name]") works as a Social worker for the [employer] as well as having his own clinic in [place]. He is originally from [place]. As of August 2018 they are in process of separation. The patient's children are [name], who lives in [place] and has a Oceanographer in  counseling; and [place] Cellar, who is working in Engineer, technical sales. Both of them are now married and have moved out of the house. The patient has 2 grandchildren. [place] Cellar shares custody of his daughter, who is 99 years old.    ADVANCED DIRECTIVES: In place   HEALTH MAINTENANCE: Social History   Tobacco Use  . Smoking status: Never Smoker  . Smokeless tobacco: Never Used  Substance Use Topics  . Alcohol use: Yes    Comment: wine occasionally  . Drug use: No     Colonoscopy: 06/02/2011 / [facility]  PAP:  Bone density: 05/19/2017 at [facility] showed a T score of -3.1 osteoporosis.  Lipid panel:  Allergies  Allergen Reactions  . Morphine And Related Itching       Vitals:   08/10/17 0949  BP: (!) 123/58  Pulse: 99  Resp: 20  Temp: 98.7 F (37.1 C)  SpO2: 100%     Body mass index is 24.62 kg/m.    ECOG FS: 1 Filed Weights   08/10/17 0949  Weight: 139 lb (63 kg)   OBJECTIVE: Middle-aged  white woman in no acute distress  Sclerae unicteric, pupils round and equal Oropharynx clear and moist No cervical or supraclavicular adenopathy Lungs no rales or rhonchi Heart regular rate and rhythm Abd soft, nontender, positive bowel sounds MSK no focal spinal tenderness, no upper extremity lymphedema Neuro: nonfocal, well oriented, appropriate affect Breasts: The right breast is status post lumpectomy followed by radiation with no evidence of local recurrence.  The left breast is benign.  Both axillae are benign.  LAB RESULTS:   Lab Results  Component Value Date   WBC 6.7 08/10/2017   NEUTROABS 3.6 08/10/2017   HGB 12.8 08/10/2017   HCT 39.2 08/10/2017   MCV 87.9 08/10/2017   PLT 324 08/10/2017      Chemistry      Component Value Date/Time   NA 139 02/09/2017 0911   K 4.0 02/09/2017 0911   CL 102 11/03/2012 0821   CO2 26 02/09/2017 0911   BUN 15.5 02/09/2017 0911   CREATININE 0.9 02/09/2017 0911      Component Value Date/Time   CALCIUM 9.9 02/09/2017 0911   ALKPHOS 60 02/09/2017  0911   AST 17 02/09/2017 0911   ALT 14 02/09/2017 0911   BILITOT 0.45 02/09/2017 0911       STUDIES: Since her last visit, she completed a bilateral breast MRI on 05/05/2017 showing no malignancy in either breast.  Mammography at [facility] 02/03/2017 found the breast density to be category D. There was no mammographic evidence of malignancy.   Bone density at [facility] on 05/19/2017 showed a T score of -3.1 osteoporosis.  This is a minimal change compared to prior  ASSESSMENT: 57 y.o. [place] woman status post right breast lower inner quadrant biopsy 10/28/2012 for a clinical T1c N0, stage IA invasive ductal carcinoma, grade 2, estrogen and progesterone receptor positive, HER-2 not amplified, with an MIB-1 of 20%  (0) Amand's genetics show a VUS, namely TP53 c.[id number]>T.    (1) s/p Right lumpectomy and sentinel lymph node sampling 11/17/2012 for a pT2 pN0, stage IIA invasive ductal carcinoma with prominent lobular features, grade 2, with initially positive  margins cleared intraoperatively; estrogen receptor positive with an Allred score of 7, progesterone receptor positive with an Allred score of 8, HercepTest 0, proliferation index by ACIS III 32% ([id number])   (2) Oncotype DX score of 33 predicting a risk of distant recurrence within 10 years of 23% if her only systemic treatment is tamoxifen for 5 years; the estrogen receptor was interpreted as negative on the Oncotype report  (3) adjuvant cyclophosphamide and docetaxel started 12/30/2012, first cycle given at [facility], with the remaining cycles given  at [facility] and completed 4th cycle 03/03/2013  (4)  adjuvant radiation therapy completed 05/10/2013  (5) started tamoxifen January 2015, stopped March 2015 due to depression  (6) anastrozole started 11/12/2013, discontinued 02/24/2014 with significant arthralgias/myalgias, resumed November 2015 with no improvement in the arthralgias myalgias, again discontinued  (7) exemestane started November  2015  (a) held 03/14/2015 because of arthralgias and myalgias.  (8) letrozole started 05/16/2015, to be continued throuogh December 2020  (a) bone density 04/23/2015 at [place] shows osteoporosis with a T score of -3.0  (b) ibandronate started 02/15/2016   (c) bone density 05/19/2017 at [facility] showed a T score of -3.1 osteoporosis.  PLAN:  Shawnise is now 4-1/2 years out from definitive surgery for her breast cancer with no evidence of disease recurrence.  This is very favorable.  She continues on letrozole, with good tolerance.  She  had a difficult time with antiestrogens so we think continuing through December 2020 would be a minimum.  Her osteoporosis is really not significantly worse but it is also not better on ibandronate.  Today we discussed other options including denosumab/Prolia and zolendronate/Reclast.  She is aware of the possible toxicity side effects and complications of these agents including the rare cases of osteonecrosis of the jaw.  She is also aware that particularly with zolendronate, when given every 6 months, there is data documenting a further decrease in breast cancer recurrence above and beyond that which is obtained from antiestrogens.  After much discussion she would prefer to go with zolendronate.  She will receive that 08/27/2017, 6 months later, and 12 months later.  She will see me with a 8-monthdose.  She had to pay almost $1500 for her breast MRI.  I think doing this every 2 years is adequate.  She will have her next mammography in November of this year  She is going through a difficult emotional time but she has good support.  She knows to call for any problems that may develop before the next visit.  Toua Stites, GVirgie Dad MD  08/10/17 10:17 AM Medical Oncology and Hematology [facility]AChurchville [place] [address]Tel. [phone]   Fax. [phone] This document serves as a record of services personally performed by  GLurline Del MD. It was created on his behalf by ASheron Nightingale a trained medical scribe. The creation of this record is based on the scribe's personal observations and the provider's statements to them.   I have reviewed the above documentation for accuracy and completeness, and I agree with the above.

## 2017-08-10 ENCOUNTER — Inpatient Hospital Stay (HOSPITAL_BASED_OUTPATIENT_CLINIC_OR_DEPARTMENT_OTHER): Payer: 59 | Admitting: Oncology

## 2017-08-10 ENCOUNTER — Inpatient Hospital Stay: Payer: 59 | Attending: Oncology

## 2017-08-10 DIAGNOSIS — Z801 Family history of malignant neoplasm of trachea, bronchus and lung: Secondary | ICD-10-CM | POA: Insufficient documentation

## 2017-08-10 DIAGNOSIS — Z79811 Long term (current) use of aromatase inhibitors: Secondary | ICD-10-CM | POA: Insufficient documentation

## 2017-08-10 DIAGNOSIS — Z923 Personal history of irradiation: Secondary | ICD-10-CM

## 2017-08-10 DIAGNOSIS — K219 Gastro-esophageal reflux disease without esophagitis: Secondary | ICD-10-CM

## 2017-08-10 DIAGNOSIS — C50311 Malignant neoplasm of lower-inner quadrant of right female breast: Secondary | ICD-10-CM

## 2017-08-10 DIAGNOSIS — Z800 Family history of malignant neoplasm of digestive organs: Secondary | ICD-10-CM | POA: Diagnosis not present

## 2017-08-10 DIAGNOSIS — R232 Flushing: Secondary | ICD-10-CM | POA: Insufficient documentation

## 2017-08-10 DIAGNOSIS — F418 Other specified anxiety disorders: Secondary | ICD-10-CM | POA: Insufficient documentation

## 2017-08-10 DIAGNOSIS — Z170 Estrogen receptor positive status [ER+]: Secondary | ICD-10-CM

## 2017-08-10 DIAGNOSIS — Z9221 Personal history of antineoplastic chemotherapy: Secondary | ICD-10-CM

## 2017-08-10 DIAGNOSIS — Z86010 Personal history of colonic polyps: Secondary | ICD-10-CM | POA: Insufficient documentation

## 2017-08-10 DIAGNOSIS — M810 Age-related osteoporosis without current pathological fracture: Secondary | ICD-10-CM

## 2017-08-10 DIAGNOSIS — Z809 Family history of malignant neoplasm, unspecified: Secondary | ICD-10-CM

## 2017-08-10 LAB — COMPREHENSIVE METABOLIC PANEL
ALBUMIN: 4.1 g/dL (ref 3.5–5.0)
ALT: 17 U/L (ref 0–55)
ANION GAP: 7 (ref 3–11)
AST: 16 U/L (ref 5–34)
Alkaline Phosphatase: 59 U/L (ref 40–150)
BILIRUBIN TOTAL: 0.3 mg/dL (ref 0.2–1.2)
BUN: 13 mg/dL (ref 7–26)
CALCIUM: 9.8 mg/dL (ref 8.4–10.4)
CO2: 27 mmol/L (ref 22–29)
Chloride: 105 mmol/L (ref 98–109)
Creatinine, Ser: 0.94 mg/dL (ref 0.60–1.10)
GFR calc non Af Amer: >60 mL/min (ref >60)
Glucose, Bld: 95 mg/dL (ref 70–140)
POTASSIUM: 4.6 mmol/L (ref 3.5–5.1)
SODIUM: 139 mmol/L (ref 136–145)
TOTAL PROTEIN: 7.4 g/dL (ref 6.4–8.3)

## 2017-08-10 LAB — CBC WITH DIFFERENTIAL/PLATELET
BASOS PCT: 1 %
Basophils Absolute: 0.1 10*3/uL (ref 0.0–0.1)
EOS ABS: 0.5 10*3/uL (ref 0.0–0.5)
Eosinophils Relative: 8 %
HEMATOCRIT: 39.2 % (ref 34.8–46.6)
Hemoglobin: 12.8 g/dL (ref 11.6–15.9)
LYMPHS ABS: 2 10*3/uL (ref 0.9–3.3)
Lymphocytes Relative: 30 %
MCH: 28.7 pg (ref 25.1–34.0)
MCHC: 32.7 g/dL (ref 31.5–36.0)
MCV: 87.9 fL (ref 79.5–101.0)
MONO ABS: 0.5 10*3/uL (ref 0.1–0.9)
Monocytes Relative: 7 %
Neutro Abs: 3.6 10*3/uL (ref 1.5–6.5)
Neutrophils Relative %: 54 %
Platelets: 324 10*3/uL (ref 145–400)
RBC: 4.46 MIL/uL (ref 3.70–5.45)
RDW: 13.7 % (ref 11.2–14.5)
WBC: 6.7 10*3/uL (ref 3.9–10.3)

## 2017-08-10 MED ORDER — LETROZOLE 2.5 MG PO TABS: 2.5000 mg | ORAL_TABLET | Freq: Every day | ORAL | 3 refills | Status: DC

## 2017-08-19 DIAGNOSIS — F4320 Adjustment disorder, unspecified: Secondary | ICD-10-CM | POA: Diagnosis not present

## 2017-08-27 ENCOUNTER — Inpatient Hospital Stay: Payer: BLUE CROSS/BLUE SHIELD | Attending: Oncology

## 2017-08-27 ENCOUNTER — Ambulatory Visit: Payer: 59

## 2017-08-27 VITALS — BP 117/54 | HR 93 | Temp 98.2°F | Resp 18

## 2017-08-27 DIAGNOSIS — Z170 Estrogen receptor positive status [ER+]: Secondary | ICD-10-CM | POA: Diagnosis not present

## 2017-08-27 DIAGNOSIS — Z79899 Other long term (current) drug therapy: Secondary | ICD-10-CM | POA: Insufficient documentation

## 2017-08-27 DIAGNOSIS — M810 Age-related osteoporosis without current pathological fracture: Secondary | ICD-10-CM | POA: Diagnosis not present

## 2017-08-27 DIAGNOSIS — M818 Other osteoporosis without current pathological fracture: Secondary | ICD-10-CM

## 2017-08-27 DIAGNOSIS — C50311 Malignant neoplasm of lower-inner quadrant of right female breast: Secondary | ICD-10-CM | POA: Diagnosis not present

## 2017-08-27 MED ORDER — SODIUM CHLORIDE 0.9 % IV SOLN
Freq: Once | INTRAVENOUS | Status: AC
Start: 2017-08-27 — End: 2017-08-27
  Administered 2017-08-27: 10:00:00 via INTRAVENOUS

## 2017-08-27 MED ORDER — ZOLEDRONIC ACID 4 MG/100ML IV SOLN
4.0000 mg | Freq: Once | INTRAVENOUS | Status: AC
  Administered 2017-08-27: 4 mg via INTRAVENOUS
  Filled 2017-08-27: qty 100

## 2017-08-27 NOTE — Patient Instructions (Signed)
[facility] Discharge Instructions for Patients Receiving Chemotherapy  Today you received the following chemotherapy agents Zometa  To help prevent nausea and vomiting after your treatment, we encourage you to take your nausea medication as directed   If you develop nausea and vomiting that is not controlled by your nausea medication, call the clinic.   BELOW ARE SYMPTOMS THAT SHOULD BE REPORTED IMMEDIATELY:  *FEVER GREATER THAN 100.5 F  *CHILLS WITH OR WITHOUT FEVER  NAUSEA AND VOMITING THAT IS NOT CONTROLLED WITH YOUR NAUSEA MEDICATION  *UNUSUAL SHORTNESS OF BREATH  *UNUSUAL BRUISING OR BLEEDING  TENDERNESS IN MOUTH AND THROAT WITH OR WITHOUT PRESENCE OF ULCERS  *URINARY PROBLEMS  *BOWEL PROBLEMS  UNUSUAL RASH Items with * indicate a potential emergency and should be followed up as soon as possible.  Feel free to call the clinic should you have any questions or concerns. The clinic phone number is [phone].  Please show the [facility] at check-in to the Emergency Department and triage nurse.    Zoledronic Acid injection (Hypercalcemia, Oncology) What is this medicine? ZOLEDRONIC ACID (ZOE le dron ik AS id) lowers the amount of calcium loss from bone. It is used to treat too much calcium in your blood from cancer. It is also used to prevent complications of cancer that has spread to the bone. This medicine may be used for other purposes; ask your health care provider or pharmacist if you have questions. COMMON BRAND NAME(S): Zometa What should I tell my health care provider before I take this medicine? They need to know if you have any of these conditions: -aspirin-sensitive asthma -cancer, especially if you are receiving medicines used to treat cancer -dental disease or wear dentures -infection -kidney disease -receiving corticosteroids like dexamethasone or prednisone -an unusual or allergic reaction to zoledronic acid, other medicines,  foods, dyes, or preservatives -pregnant or trying to get pregnant -breast-feeding How should I use this medicine? This medicine is for infusion into a vein. It is given by a health care professional in a hospital or clinic setting. Talk to your pediatrician regarding the use of this medicine in children. Special care may be needed. Overdosage: If you think you have taken too much of this medicine contact a poison control center or emergency room at once. NOTE: This medicine is only for you. Do not share this medicine with others. What if I miss a dose? It is important not to miss your dose. Call your doctor or health care professional if you are unable to keep an appointment. What may interact with this medicine? -certain antibiotics given by injection -NSAIDs, medicines for pain and inflammation, like ibuprofen or naproxen -some diuretics like bumetanide, furosemide -teriparatide -thalidomide This list may not describe all possible interactions. Give your health care provider a list of all the medicines, herbs, non-prescription drugs, or dietary supplements you use. Also tell them if you smoke, drink alcohol, or use illegal drugs. Some items may interact with your medicine. What should I watch for while using this medicine? Visit your doctor or health care professional for regular checkups. It may be some time before you see the benefit from this medicine. Do not stop taking your medicine unless your doctor tells you to. Your doctor may order blood tests or other tests to see how you are doing. Women should inform their doctor if they wish to become pregnant or think they might be pregnant. There is a potential for serious side effects to an unborn child.  Talk to your health care professional or pharmacist for more information. You should make sure that you get enough calcium and vitamin D while you are taking this medicine. Discuss the foods you eat and the vitamins you take with your health  care professional. Some people who take this medicine have severe bone, joint, and/or muscle pain. This medicine may also increase your risk for jaw problems or a broken thigh bone. Tell your doctor right away if you have severe pain in your jaw, bones, joints, or muscles. Tell your doctor if you have any pain that does not go away or that gets worse. Tell your dentist and dental surgeon that you are taking this medicine. You should not have major dental surgery while on this medicine. See your dentist to have a dental exam and fix any dental problems before starting this medicine. Take good care of your teeth while on this medicine. Make sure you see your dentist for regular follow-up appointments. What side effects may I notice from receiving this medicine? Side effects that you should report to your doctor or health care professional as soon as possible: -allergic reactions like skin rash, itching or hives, swelling of the face, lips, or tongue -anxiety, confusion, or depression -breathing problems -changes in vision -eye pain -feeling faint or lightheaded, falls -jaw pain, especially after dental work -mouth sores -muscle cramps, stiffness, or weakness -redness, blistering, peeling or loosening of the skin, including inside the mouth -trouble passing urine or change in the amount of urine Side effects that usually do not require medical attention (report to your doctor or health care professional if they continue or are bothersome): -bone, joint, or muscle pain -constipation -diarrhea -fever -hair loss -irritation at site where injected -loss of appetite -nausea, vomiting -stomach upset -trouble sleeping -trouble swallowing -weak or tired This list may not describe all possible side effects. Call your doctor for medical advice about side effects. You may report side effects to FDA at 1-800-FDA-1088. Where should I keep my medicine? This drug is given in a hospital or clinic and  will not be stored at home. NOTE: This sheet is a summary. It may not cover all possible information. If you have questions about this medicine, talk to your doctor, pharmacist, or health care provider.  2018 Elsevier/Gold Standard (2013-10-29 14:19:39).

## 2017-09-09 DIAGNOSIS — F4320 Adjustment disorder, unspecified: Secondary | ICD-10-CM | POA: Diagnosis not present

## 2017-09-24 DIAGNOSIS — F4320 Adjustment disorder, unspecified: Secondary | ICD-10-CM | POA: Diagnosis not present

## 2017-10-08 DIAGNOSIS — F4322 Adjustment disorder with anxiety: Secondary | ICD-10-CM | POA: Diagnosis not present

## 2017-10-27 DIAGNOSIS — F3132 Bipolar disorder, current episode depressed, moderate: Secondary | ICD-10-CM | POA: Diagnosis not present

## 2017-11-10 DIAGNOSIS — F4320 Adjustment disorder, unspecified: Secondary | ICD-10-CM | POA: Diagnosis not present

## 2017-12-08 DIAGNOSIS — F4322 Adjustment disorder with anxiety: Secondary | ICD-10-CM | POA: Diagnosis not present

## 2018-01-09 DIAGNOSIS — F4320 Adjustment disorder, unspecified: Secondary | ICD-10-CM | POA: Diagnosis not present

## 2018-01-21 DIAGNOSIS — F4320 Adjustment disorder, unspecified: Secondary | ICD-10-CM | POA: Diagnosis not present

## 2018-02-01 DIAGNOSIS — F3132 Bipolar disorder, current episode depressed, moderate: Secondary | ICD-10-CM | POA: Diagnosis not present

## 2018-02-03 DIAGNOSIS — F4322 Adjustment disorder with anxiety: Secondary | ICD-10-CM | POA: Diagnosis not present

## 2018-02-09 ENCOUNTER — Encounter: Payer: Self-pay | Admitting: Oncology

## 2018-02-09 DIAGNOSIS — R922 Inconclusive mammogram: Secondary | ICD-10-CM | POA: Diagnosis not present

## 2018-02-09 DIAGNOSIS — Z853 Personal history of malignant neoplasm of breast: Secondary | ICD-10-CM | POA: Diagnosis not present

## 2018-02-24 DIAGNOSIS — F4322 Adjustment disorder with anxiety: Secondary | ICD-10-CM | POA: Diagnosis not present

## 2018-02-25 ENCOUNTER — Inpatient Hospital Stay: Payer: BLUE CROSS/BLUE SHIELD | Attending: Oncology

## 2018-02-25 ENCOUNTER — Inpatient Hospital Stay: Payer: BLUE CROSS/BLUE SHIELD

## 2018-02-25 VITALS — BP 116/80 | HR 89 | Temp 98.2°F | Resp 16

## 2018-02-25 DIAGNOSIS — Z79899 Other long term (current) drug therapy: Secondary | ICD-10-CM | POA: Insufficient documentation

## 2018-02-25 DIAGNOSIS — M810 Age-related osteoporosis without current pathological fracture: Secondary | ICD-10-CM | POA: Insufficient documentation

## 2018-02-25 DIAGNOSIS — M818 Other osteoporosis without current pathological fracture: Secondary | ICD-10-CM

## 2018-02-25 DIAGNOSIS — Z170 Estrogen receptor positive status [ER+]: Principal | ICD-10-CM

## 2018-02-25 DIAGNOSIS — C50311 Malignant neoplasm of lower-inner quadrant of right female breast: Secondary | ICD-10-CM

## 2018-02-25 LAB — COMPREHENSIVE METABOLIC PANEL
ALBUMIN: 4.3 g/dL (ref 3.5–5.0)
ALT: 13 U/L (ref 0–44)
AST: 15 U/L (ref 15–41)
Alkaline Phosphatase: 49 U/L (ref 38–126)
Anion gap: 8 (ref 5–15)
BUN: 15 mg/dL (ref 6–20)
CHLORIDE: 105 mmol/L (ref 98–111)
CO2: 27 mmol/L (ref 22–32)
CREATININE: 0.94 mg/dL (ref 0.44–1.00)
Calcium: 9.8 mg/dL (ref 8.9–10.3)
Glucose, Bld: 86 mg/dL (ref 70–99)
POTASSIUM: 4.3 mmol/L (ref 3.5–5.1)
Sodium: 140 mmol/L (ref 135–145)
Total Bilirubin: 0.4 mg/dL (ref 0.3–1.2)
Total Protein: 7.4 g/dL (ref 6.5–8.1)

## 2018-02-25 LAB — CBC WITH DIFFERENTIAL/PLATELET
Basophils Absolute: 0 10*3/uL (ref 0.0–0.1)
Basophils Relative: 1 %
EOS ABS: 0.2 10*3/uL (ref 0.0–0.5)
Eosinophils Relative: 3 %
HCT: 37.8 % (ref 34.8–46.6)
HEMOGLOBIN: 12.3 g/dL (ref 11.6–15.9)
LYMPHS ABS: 2.4 10*3/uL (ref 0.9–3.3)
LYMPHS PCT: 40 %
MCH: 28.9 pg (ref 25.1–34.0)
MCHC: 32.5 g/dL (ref 31.5–36.0)
MCV: 88.7 fL (ref 79.5–101.0)
Monocytes Absolute: 0.5 10*3/uL (ref 0.1–0.9)
Monocytes Relative: 7 %
NEUTROS PCT: 49 %
Neutro Abs: 3 10*3/uL (ref 1.5–6.5)
Platelets: 288 10*3/uL (ref 145–400)
RBC: 4.26 MIL/uL (ref 3.70–5.45)
RDW: 14 % (ref 11.2–14.5)
WBC: 6.2 10*3/uL (ref 3.9–10.3)

## 2018-02-25 MED ORDER — ZOLEDRONIC ACID 4 MG/100ML IV SOLN
4.0000 mg | Freq: Once | INTRAVENOUS | Status: AC
  Administered 2018-02-25: 4 mg via INTRAVENOUS
  Filled 2018-02-25: qty 100

## 2018-03-11 DIAGNOSIS — F4320 Adjustment disorder, unspecified: Secondary | ICD-10-CM | POA: Diagnosis not present

## 2018-04-05 DIAGNOSIS — Z23 Encounter for immunization: Secondary | ICD-10-CM | POA: Diagnosis not present

## 2018-04-08 DIAGNOSIS — F4322 Adjustment disorder with anxiety: Secondary | ICD-10-CM | POA: Diagnosis not present

## 2018-05-06 ENCOUNTER — Other Ambulatory Visit: Payer: Self-pay

## 2018-05-06 MED ORDER — DIAZEPAM 5 MG PO TABS: 5.0000 mg | ORAL_TABLET | Freq: Every evening | ORAL | 0 refills | Status: DC | PRN

## 2018-05-10 ENCOUNTER — Encounter: Payer: Self-pay | Admitting: Emergency Medicine

## 2018-05-10 DIAGNOSIS — F988 Other specified behavioral and emotional disorders with onset usually occurring in childhood and adolescence: Secondary | ICD-10-CM

## 2018-05-10 DIAGNOSIS — F411 Generalized anxiety disorder: Secondary | ICD-10-CM

## 2018-05-12 DIAGNOSIS — F4322 Adjustment disorder with anxiety: Secondary | ICD-10-CM | POA: Diagnosis not present

## 2018-05-24 ENCOUNTER — Encounter: Payer: Self-pay | Admitting: Psychiatry

## 2018-05-24 ENCOUNTER — Ambulatory Visit: Payer: BLUE CROSS/BLUE SHIELD | Admitting: Psychiatry

## 2018-05-24 DIAGNOSIS — F411 Generalized anxiety disorder: Secondary | ICD-10-CM | POA: Diagnosis not present

## 2018-05-24 DIAGNOSIS — F331 Major depressive disorder, recurrent, moderate: Secondary | ICD-10-CM

## 2018-05-24 DIAGNOSIS — F900 Attention-deficit hyperactivity disorder, predominantly inattentive type: Secondary | ICD-10-CM | POA: Diagnosis not present

## 2018-05-24 MED ORDER — LISDEXAMFETAMINE DIMESYLATE 50 MG PO CAPS: 50.0000 mg | ORAL_CAPSULE | Freq: Every day | ORAL | 0 refills | Status: DC

## 2018-05-24 MED ORDER — BUPROPION HCL ER (XL) 300 MG PO TB24: 300.0000 mg | ORAL_TABLET | Freq: Every day | ORAL | 1 refills | Status: DC

## 2018-05-24 NOTE — Progress Notes (Signed)
[name] [id number] [birth year] 57 y.o.  Subjective:   Patient ID:  [name] is a 57 y.o. (DOB Dec 16, 1960) female.  Chief Complaint:  Chief Complaint  Patient presents with  . Follow-up    Medication management    HPI [name] presents to the office today for follow-up of depression and ADD. Reduced the Wellbutrin from 450 to 318m about a month ago and the tremor is better.  No worsening of mood.    I feel like I'm doing fine.  Not generally depressed.  Function at work and home is good.  Taking piano and quilting lessons.  Things starting to settle with Ex.  She wonders about decreasing meds.  Maybe dropping the Abilify.  Review of Systems:  Review of Systems  Neurological: Negative for tremors and weakness.  Psychiatric/Behavioral: Negative for agitation, behavioral problems, confusion, decreased concentration, dysphoric mood, hallucinations, self-injury, sleep disturbance and suicidal ideas. The patient is not nervous/anxious and is not hyperactive.     Medications: I have reviewed the patient's current medications.  Current Outpatient Medications  Medication Sig Dispense Refill  . ARIPiprazole (ABILIFY) 10 MG tablet Take 10 mg by mouth daily.  3  . buPROPion (WELLBUTRIN XL) 150 MG 24 hr tablet Take 300 mg by mouth daily.    . Cholecalciferol (VITAMIN D3) 5000 units CAPS Take by mouth.    . cyclobenzaprine (FLEXERIL) 10 MG tablet Take 10 mg by mouth 3 (three) times daily as needed for muscle spasms.    . diazepam (VALIUM) 5 MG tablet Take 1 tablet (5 mg total) by mouth at bedtime as needed. for sleep 30 tablet 0  . letrozole (FEMARA) 2.5 MG tablet Take 1 tablet (2.5 mg total) by mouth daily. 90 tablet 3  . lisdexamfetamine (VYVANSE) 50 MG capsule Take 50 mg by mouth daily.     No current facility-administered medications for this visit.     Medication Side Effects: None  Allergies:  Allergies  Allergen Reactions  . Morphine And Related  Itching    Past Medical History:  Diagnosis Date  . Anxiety   . Asthma    as a child, early adult...none now  . BRCA negative 06/2013   BRCA I & II negative  . Breast cancer ([facility] 10/28/12   right breast  . Colonic mass 07/28/2011   ascending colon and found a polyp 2.5 cm adenoma - hemicoloectomy  . Depression   . GERD (gastroesophageal reflux disease)    occas  at  night....twice a  month--otc meds  . Recurrent upper respiratory infection (URI)    started 1/28.....getting better  . S/P chemotherapy, time since greater than 12 weeks     Family History  Problem Relation Age of Onset  . Heart disease Father   . Cancer Paternal Aunt        unknown form of cancer  . Lung cancer Maternal Grandmother   . Testicular cancer Cousin        paternal cousin; died in 349s . Pancreatic cancer Cousin 423 . Colon cancer Other        paternal grandmother's sister  . Cancer Other        MGM's sister    Social History   Socioeconomic History  . Marital status: Married    Spouse name: HTerrall Laity . Number of children: 2  . Years of education: Not on file  . Highest education level: Not on file  Occupational History    Employer: [employer]  Social Needs  . Financial resource strain: Not on file  . Food insecurity:    Worry: Not on file    Inability: Not on file  . Transportation needs:    Medical: Not on file    Non-medical: Not on file  Tobacco Use  . Smoking status: Never Smoker  . Smokeless tobacco: Never Used  Substance and Sexual Activity  . Alcohol use: Yes    Comment: wine occasionally  . Drug use: No  . Sexual activity: Yes    Birth control/protection: Surgical  Lifestyle  . Physical activity:    Days per week: Not on file    Minutes per session: Not on file  . Stress: Not on file  Relationships  . Social connections:    Talks on phone: Not on file    Gets together: Not on file    Attends religious service: Not on file    Active member of club or organization: Not on  file    Attends meetings of clubs or organizations: Not on file    Relationship status: Not on file  . Intimate partner violence:    Fear of current or ex partner: Not on file    Emotionally abused: Not on file    Physically abused: Not on file    Forced sexual activity: Not on file  Other Topics Concern  . Not on file  Social History Narrative  . Not on file    Past Medical History, Surgical history, Social history, and Family history were reviewed and updated as appropriate.   Please see review of systems for further details on the patient's review from today.   Objective:   Physical Exam:  There were no vitals taken for this visit.  Physical Exam  Constitutional: She is oriented to person, place, and time. She appears well-developed. No distress.  Musculoskeletal: She exhibits no deformity.  Neurological: She is alert and oriented to person, place, and time. She displays no tremor. Coordination and gait normal.  Psychiatric: She has a normal mood and affect. Her speech is normal and behavior is normal. Judgment and thought content normal. Her mood appears not anxious. Her affect is not angry, not blunt, not labile and not inappropriate. Cognition and memory are normal. She does not exhibit a depressed mood. She expresses no homicidal and no suicidal ideation. She expresses no suicidal plans and no homicidal plans.  Insight intact. No auditory or visual hallucinations. No delusions.  She is attentive.    Lab Review:     Component Value Date/Time   NA 140 02/25/2018 1046   NA 139 02/09/2017 0911   K 4.3 02/25/2018 1046   K 4.0 02/09/2017 0911   CL 105 02/25/2018 1046   CL 102 11/03/2012 0821   CO2 27 02/25/2018 1046   CO2 26 02/09/2017 0911   GLUCOSE 86 02/25/2018 1046   GLUCOSE 104 02/09/2017 0911   GLUCOSE 93 11/03/2012 0821   BUN 15 02/25/2018 1046   BUN 15.5 02/09/2017 0911   CREATININE 0.94 02/25/2018 1046   CREATININE 0.9 02/09/2017 0911   CALCIUM 9.8  02/25/2018 1046   CALCIUM 9.9 02/09/2017 0911   PROT 7.4 02/25/2018 1046   PROT 7.4 02/09/2017 0911   ALBUMIN 4.3 02/25/2018 1046   ALBUMIN 4.1 02/09/2017 0911   AST 15 02/25/2018 1046   AST 17 02/09/2017 0911   ALT 13 02/25/2018 1046   ALT 14 02/09/2017 0911   ALKPHOS 49 02/25/2018 1046   ALKPHOS 60 02/09/2017 0911  BILITOT 0.4 02/25/2018 1046   BILITOT 0.45 02/09/2017 0911   GFRNONAA >60 02/25/2018 1046   GFRAA >60 02/25/2018 1046       Component Value Date/Time   WBC 6.2 02/25/2018 1046   RBC 4.26 02/25/2018 1046   HGB 12.3 02/25/2018 1046   HGB 12.6 02/09/2017 0911   HCT 37.8 02/25/2018 1046   HCT 38.8 02/09/2017 0911   PLT 288 02/25/2018 1046   PLT 295 02/09/2017 0911   MCV 88.7 02/25/2018 1046   MCV 87.4 02/09/2017 0911   MCH 28.9 02/25/2018 1046   MCHC 32.5 02/25/2018 1046   RDW 14.0 02/25/2018 1046   RDW 14.2 02/09/2017 0911   LYMPHSABS 2.4 02/25/2018 1046   LYMPHSABS 1.6 02/09/2017 0911   MONOABS 0.5 02/25/2018 1046   MONOABS 0.5 02/09/2017 0911   EOSABS 0.2 02/25/2018 1046   EOSABS 0.1 02/09/2017 0911   BASOSABS 0.0 02/25/2018 1046   BASOSABS 0.1 02/09/2017 0911    No results found for: POCLITH, LITHIUM   No results found for: PHENYTOIN, PHENOBARB, VALPROATE, CBMZ   .res Assessment: Plan:    Major depressive disorder, recurrent episode, moderate (HCC)  Generalized anxiety disorder  Attention deficit hyperactivity disorder (ADHD), predominantly inattentive type   She is markedly better than previous times when she was seen.  Part of this is progressing and resolving the end of her marriage.  Her depression is better controlled ended her anxiety is also better controlled.  She wonders about reducing some of the medication.  She is tolerated the reduction Wellbutrin fine and her tremor is markedly better with a lower dose.  After the holidays reduce the Abilify to 5 mg daily.  Call if any increase anxiety or depression.  Discussed potential metabolic  side effects associated with atypical antipsychotics, as well as potential risk for movement side effects. Advised pt to contact office if movement side effects occur.   Discussed potential benefits, risks, and side effects of stimulants with patient to include increased heart rate, palpitations, insomnia, increased anxiety, increased irritability, or decreased appetite.  Instructed patient to contact office if experiencing any significant tolerability issues.  Follow-up 3 months Lynder Parents MD, DFAPA   Please see After Visit Summary for patient specific instructions.  Future Appointments  Date Time Provider [place]  08/26/2018 11:00 AM [facility] 1 [facility] None  08/26/2018 11:30 AM Magrinat, Virgie Dad, MD [facility] None  08/26/2018 12:15 PM [facility] [place] [facility] None    No orders of the defined types were placed in this encounter.     -------------------------------

## 2018-06-10 DIAGNOSIS — F4320 Adjustment disorder, unspecified: Secondary | ICD-10-CM | POA: Diagnosis not present

## 2018-07-01 DIAGNOSIS — F4322 Adjustment disorder with anxiety: Secondary | ICD-10-CM | POA: Diagnosis not present

## 2018-07-15 DIAGNOSIS — K5900 Constipation, unspecified: Secondary | ICD-10-CM | POA: Diagnosis not present

## 2018-07-23 DIAGNOSIS — Z01419 Encounter for gynecological examination (general) (routine) without abnormal findings: Secondary | ICD-10-CM | POA: Diagnosis not present

## 2018-07-23 DIAGNOSIS — N941 Unspecified dyspareunia: Secondary | ICD-10-CM | POA: Diagnosis not present

## 2018-07-23 DIAGNOSIS — C50919 Malignant neoplasm of unspecified site of unspecified female breast: Secondary | ICD-10-CM | POA: Diagnosis not present

## 2018-07-23 DIAGNOSIS — Z6823 Body mass index (BMI) 23.0-23.9, adult: Secondary | ICD-10-CM | POA: Diagnosis not present

## 2018-08-04 ENCOUNTER — Telehealth: Payer: Self-pay | Admitting: Oncology

## 2018-08-04 NOTE — Telephone Encounter (Signed)
GM CME 3/12 - moved appointments from [facility] 3/12 to [facility] 3/11. Spoke with patient she is aware. She could not come 3/13 and appointments were moved to 3/11.

## 2018-08-05 DIAGNOSIS — F4322 Adjustment disorder with anxiety: Secondary | ICD-10-CM | POA: Diagnosis not present

## 2018-08-09 DIAGNOSIS — E7800 Pure hypercholesterolemia, unspecified: Secondary | ICD-10-CM | POA: Diagnosis not present

## 2018-08-09 DIAGNOSIS — Z0000 Encounter for general adult medical examination without abnormal findings: Secondary | ICD-10-CM | POA: Diagnosis not present

## 2018-08-19 ENCOUNTER — Other Ambulatory Visit: Payer: Self-pay

## 2018-08-19 MED ORDER — DIAZEPAM 5 MG PO TABS: 5.0000 mg | ORAL_TABLET | Freq: Every evening | ORAL | 0 refills | Status: DC | PRN

## 2018-08-23 ENCOUNTER — Ambulatory Visit: Payer: BLUE CROSS/BLUE SHIELD | Admitting: Psychiatry

## 2018-08-23 ENCOUNTER — Encounter: Payer: Self-pay | Admitting: Psychiatry

## 2018-08-23 VITALS — BP 119/67 | HR 98

## 2018-08-23 DIAGNOSIS — Z86010 Personal history of colonic polyps: Secondary | ICD-10-CM | POA: Diagnosis not present

## 2018-08-23 DIAGNOSIS — F331 Major depressive disorder, recurrent, moderate: Secondary | ICD-10-CM

## 2018-08-23 DIAGNOSIS — F900 Attention-deficit hyperactivity disorder, predominantly inattentive type: Secondary | ICD-10-CM | POA: Diagnosis not present

## 2018-08-23 DIAGNOSIS — F411 Generalized anxiety disorder: Secondary | ICD-10-CM

## 2018-08-23 MED ORDER — LISDEXAMFETAMINE DIMESYLATE 50 MG PO CAPS: 50.0000 mg | ORAL_CAPSULE | Freq: Every day | ORAL | 0 refills | Status: DC

## 2018-08-23 NOTE — Progress Notes (Signed)
MEDIA PIZZINI [id number] 12-12-1960 58 y.o.  Subjective:   Patient ID:  [name] is a 58 y.o. (DOB Aug 03, 1960) female.  Chief Complaint:  Chief Complaint  Patient presents with  . Follow-up    Mediction Management    HPI [name] presents to the office today for follow-up of depression and ADD.  Last seen May 24, 2018.  She was markedly better than she had been for a while.  She had reduce the Wellbutrin from 450 to 300 mg and was doing fine. Reduced the Wellbutrin from 450 to 351m about a month ago and the tremor is better.  No worsening of mood.  At the last visit she was instructed after the holidays she could reduce the Abilify to 5 mg daily and she would like to try to reduce what medicines she can.  She presents today for follow-up  Reduced to  5 mg beginning of Feb.  Started it July 2018.    I'm fine.  No noticeable difference.  Overall stable.  Occ bad days.  Counseling  Monthly.  Appetite and sleep fine with meds.  Only take 1 diazepam or none.  Handling stress at work.  Past Psychiatric Medication Trials: Temazepam, clonazepam, Ambien no response, Flexeril, Abilify 10, diazepam, Vyvanse, bupropion 450 tremor, buspirone 30 twice daily [place], lithium, lamotrigine, venlafaxine hypomania, fluoxetine, Adderall   Review of Systems:  Review of Systems  Neurological: Negative for tremors and weakness.  Psychiatric/Behavioral: Negative for agitation, behavioral problems, confusion, decreased concentration, dysphoric mood, hallucinations, self-injury, sleep disturbance and suicidal ideas. The patient is not nervous/anxious and is not hyperactive.     Medications: I have reviewed the patient's current medications.  Current Outpatient Medications  Medication Sig Dispense Refill  . ARIPiprazole (ABILIFY) 10 MG tablet Take 5 mg by mouth daily.   3  . buPROPion (WELLBUTRIN XL) 300 MG 24 hr tablet Take 1 tablet (300 mg total) by mouth daily. 90 tablet 1  .  Cholecalciferol (VITAMIN D3) 5000 units CAPS Take by mouth.    . cyclobenzaprine (FLEXERIL) 10 MG tablet Take 10 mg by mouth 3 (three) times daily as needed for muscle spasms.    . diazepam (VALIUM) 5 MG tablet Take 1 tablet (5 mg total) by mouth at bedtime as needed. for sleep 30 tablet 0  . lisdexamfetamine (VYVANSE) 50 MG capsule Take 1 capsule (50 mg total) by mouth daily. 30 capsule 0  . lisdexamfetamine (VYVANSE) 50 MG capsule Take 1 capsule (50 mg total) by mouth daily. 30 capsule 0  . lisdexamfetamine (VYVANSE) 50 MG capsule Take 1 capsule (50 mg total) by mouth daily. 30 capsule 0   No current facility-administered medications for this visit.     Medication Side Effects: None  Allergies:  Allergies  Allergen Reactions  . Morphine And Related Itching    Past Medical History:  Diagnosis Date  . Anxiety   . Asthma    as a child, early adult...none now  . BRCA negative 06/2013   BRCA I & II negative  . Breast cancer ([facility] 10/28/12   right breast  . Colonic mass 07/28/2011   ascending colon and found a polyp 2.5 cm adenoma - hemicoloectomy  . Depression   . GERD (gastroesophageal reflux disease)    occas  at  night....twice a  month--otc meds  . Recurrent upper respiratory infection (URI)    started 1/28.....getting better  . S/P chemotherapy, time since greater than 12 weeks     Family  History  Problem Relation Age of Onset  . Heart disease Father   . Cancer Paternal Aunt        unknown form of cancer  . Lung cancer Maternal Grandmother   . Testicular cancer Cousin        paternal cousin; died in 43s  . Pancreatic cancer Cousin 70  . Colon cancer Other        paternal grandmother's sister  . Cancer Other        MGM's sister    Social History   Socioeconomic History  . Marital status: Married    Spouse name: [name]  . Number of children: 2  . Years of education: Not on file  . Highest education level: Not on file  Occupational History    Employer: [employer]  . Financial resource strain: Not on file  . Food insecurity:    Worry: Not on file    Inability: Not on file  . Transportation needs:    Medical: Not on file    Non-medical: Not on file  Tobacco Use  . Smoking status: Never Smoker  . Smokeless tobacco: Never Used  Substance and Sexual Activity  . Alcohol use: Yes    Comment: wine occasionally  . Drug use: No  . Sexual activity: Yes    Birth control/protection: Surgical  Lifestyle  . Physical activity:    Days per week: Not on file    Minutes per session: Not on file  . Stress: Not on file  Relationships  . Social connections:    Talks on phone: Not on file    Gets together: Not on file    Attends religious service: Not on file    Active member of club or organization: Not on file    Attends meetings of clubs or organizations: Not on file    Relationship status: Not on file  . Intimate partner violence:    Fear of current or ex partner: Not on file    Emotionally abused: Not on file    Physically abused: Not on file    Forced sexual activity: Not on file  Other Topics Concern  . Not on file  Social History Narrative  . Not on file    Past Medical History, Surgical history, Social history, and Family history were reviewed and updated as appropriate.   Please see review of systems for further details on the patient's review from today.   Objective:   Physical Exam:  BP 119/67 (BP Location: Left Arm)   Pulse 98   Physical Exam Constitutional:      General: She is not in acute distress.    Appearance: She is well-developed.  Musculoskeletal:        General: No deformity.  Neurological:     Mental Status: She is alert and oriented to person, place, and time.     Motor: No tremor.     Coordination: Coordination normal.     Gait: Gait normal.  Psychiatric:        Attention and Perception: She is attentive.        Mood and Affect: Mood is not anxious or depressed. Affect is not labile, blunt,  angry or inappropriate.        Speech: Speech normal.        Behavior: Behavior normal.        Thought Content: Thought content normal. Thought content does not include homicidal or suicidal ideation. Thought content does not include  homicidal or suicidal plan.        Judgment: Judgment normal.     Comments: Insight intact. No auditory or visual hallucinations. No delusions.      Lab Review:     Component Value Date/Time   NA 140 02/25/2018 1046   NA 139 02/09/2017 0911   K 4.3 02/25/2018 1046   K 4.0 02/09/2017 0911   CL 105 02/25/2018 1046   CL 102 11/03/2012 0821   CO2 27 02/25/2018 1046   CO2 26 02/09/2017 0911   GLUCOSE 86 02/25/2018 1046   GLUCOSE 104 02/09/2017 0911   GLUCOSE 93 11/03/2012 0821   BUN 15 02/25/2018 1046   BUN 15.5 02/09/2017 0911   CREATININE 0.94 02/25/2018 1046   CREATININE 0.9 02/09/2017 0911   CALCIUM 9.8 02/25/2018 1046   CALCIUM 9.9 02/09/2017 0911   PROT 7.4 02/25/2018 1046   PROT 7.4 02/09/2017 0911   ALBUMIN 4.3 02/25/2018 1046   ALBUMIN 4.1 02/09/2017 0911   AST 15 02/25/2018 1046   AST 17 02/09/2017 0911   ALT 13 02/25/2018 1046   ALT 14 02/09/2017 0911   ALKPHOS 49 02/25/2018 1046   ALKPHOS 60 02/09/2017 0911   BILITOT 0.4 02/25/2018 1046   BILITOT 0.45 02/09/2017 0911   GFRNONAA >60 02/25/2018 1046   GFRAA >60 02/25/2018 1046       Component Value Date/Time   WBC 6.2 02/25/2018 1046   RBC 4.26 02/25/2018 1046   HGB 12.3 02/25/2018 1046   HGB 12.6 02/09/2017 0911   HCT 37.8 02/25/2018 1046   HCT 38.8 02/09/2017 0911   PLT 288 02/25/2018 1046   PLT 295 02/09/2017 0911   MCV 88.7 02/25/2018 1046   MCV 87.4 02/09/2017 0911   MCH 28.9 02/25/2018 1046   MCHC 32.5 02/25/2018 1046   RDW 14.0 02/25/2018 1046   RDW 14.2 02/09/2017 0911   LYMPHSABS 2.4 02/25/2018 1046   LYMPHSABS 1.6 02/09/2017 0911   MONOABS 0.5 02/25/2018 1046   MONOABS 0.5 02/09/2017 0911   EOSABS 0.2 02/25/2018 1046   EOSABS 0.1 02/09/2017 0911    BASOSABS 0.0 02/25/2018 1046   BASOSABS 0.1 02/09/2017 0911    No results found for: POCLITH, LITHIUM   No results found for: PHENYTOIN, PHENOBARB, VALPROATE, CBMZ   .res Assessment: Plan:    Major depressive disorder, recurrent episode, moderate (HCC)  Attention deficit hyperactivity disorder (ADHD), predominantly inattentive type  Generalized anxiety disorder   She is markedly better than previous times when she was seen.  Part of this is progressing and resolving the end of her marriage.  Her depression is better controlled ended her anxiety is also better controlled.  She wonders about reducing some of the medication.  She is tolerated the reduction Wellbutrin fine and her tremor is markedly better with a lower dose.  After the holidays reduce the Abilify to 5 mg daily.  Call if any increase anxiety or depression.  Discussed potential metabolic side effects associated with atypical antipsychotics, as well as potential risk for movement side effects. Advised pt to contact office if movement side effects occur.   Too early to reduce Abilify again.  No med changes.  Disc risk relapse.  If doing well then we'll reduce abilify to 2 mg at follow up.  Discussed potential benefits, risks, and side effects of stimulants with patient to include increased heart rate, palpitations, insomnia, increased anxiety, increased irritability, or decreased appetite.  Instructed patient to contact office if experiencing any significant tolerability issues.  She is satisfied with the Vyvanse.  Disc ADD.  She clearly got it from father.  We discussed the short-term risks associated with benzodiazepines including sedation and increased fall risk among others.  Discussed long-term side effect risk including dependence, potential withdrawal symptoms, and the potential eventual dose-related risk of dementia.  Follow-up 3 months  Lynder Parents MD, DFAPA   Please see After Visit Summary for patient specific  instructions.  Future Appointments  Date Time Provider [place]  08/25/2018  9:30 AM [facility] LAB 6 [facility] None  08/25/2018 10:00 AM Causey, Charlestine Massed, NP [facility] None  08/25/2018 10:30 AM [facility] INFUSION [facility] None    No orders of the defined types were placed in this encounter.     -------------------------------

## 2018-08-25 ENCOUNTER — Other Ambulatory Visit: Payer: Self-pay

## 2018-08-25 ENCOUNTER — Encounter: Payer: Self-pay | Admitting: Adult Health

## 2018-08-25 ENCOUNTER — Inpatient Hospital Stay (HOSPITAL_BASED_OUTPATIENT_CLINIC_OR_DEPARTMENT_OTHER): Payer: Self-pay | Admitting: Adult Health

## 2018-08-25 ENCOUNTER — Inpatient Hospital Stay: Payer: BLUE CROSS/BLUE SHIELD | Attending: Oncology

## 2018-08-25 ENCOUNTER — Inpatient Hospital Stay: Payer: Self-pay

## 2018-08-25 VITALS — BP 131/57 | HR 86 | Temp 98.1°F | Resp 18 | Ht 63.0 in | Wt 135.8 lb

## 2018-08-25 VITALS — BP 132/82 | HR 84 | Temp 98.3°F | Resp 18 | Ht 63.0 in | Wt 133.8 lb

## 2018-08-25 DIAGNOSIS — R922 Inconclusive mammogram: Secondary | ICD-10-CM

## 2018-08-25 DIAGNOSIS — Z79899 Other long term (current) drug therapy: Secondary | ICD-10-CM | POA: Insufficient documentation

## 2018-08-25 DIAGNOSIS — M810 Age-related osteoporosis without current pathological fracture: Secondary | ICD-10-CM

## 2018-08-25 DIAGNOSIS — C50311 Malignant neoplasm of lower-inner quadrant of right female breast: Secondary | ICD-10-CM

## 2018-08-25 DIAGNOSIS — Z170 Estrogen receptor positive status [ER+]: Secondary | ICD-10-CM | POA: Insufficient documentation

## 2018-08-25 DIAGNOSIS — M818 Other osteoporosis without current pathological fracture: Secondary | ICD-10-CM

## 2018-08-25 DIAGNOSIS — Z1231 Encounter for screening mammogram for malignant neoplasm of breast: Secondary | ICD-10-CM

## 2018-08-25 LAB — CBC WITH DIFFERENTIAL/PLATELET
Abs Immature Granulocytes: 0 10*3/uL (ref 0.00–0.07)
Basophils Absolute: 0 10*3/uL (ref 0.0–0.1)
Basophils Relative: 1 %
Eosinophils Absolute: 0.1 10*3/uL (ref 0.0–0.5)
Eosinophils Relative: 3 %
HEMATOCRIT: 41.4 % (ref 36.0–46.0)
HEMOGLOBIN: 13 g/dL (ref 12.0–15.0)
Immature Granulocytes: 0 %
LYMPHS PCT: 29 %
Lymphs Abs: 1.3 10*3/uL (ref 0.7–4.0)
MCH: 28.2 pg (ref 26.0–34.0)
MCHC: 31.4 g/dL (ref 30.0–36.0)
MCV: 89.8 fL (ref 80.0–100.0)
Monocytes Absolute: 0.4 10*3/uL (ref 0.1–1.0)
Monocytes Relative: 8 %
NEUTROS ABS: 2.7 10*3/uL (ref 1.7–7.7)
Neutrophils Relative %: 59 %
Platelets: 260 10*3/uL (ref 150–400)
RBC: 4.61 MIL/uL (ref 3.87–5.11)
RDW: 13.4 % (ref 11.5–15.5)
WBC: 4.6 10*3/uL (ref 4.0–10.5)
nRBC: 0 % (ref 0.0–0.2)

## 2018-08-25 LAB — COMPREHENSIVE METABOLIC PANEL
ALBUMIN: 4.3 g/dL (ref 3.5–5.0)
ALT: 18 U/L (ref 0–44)
AST: 18 U/L (ref 15–41)
Alkaline Phosphatase: 50 U/L (ref 38–126)
Anion gap: 14 (ref 5–15)
BUN: 14 mg/dL (ref 6–20)
CO2: 23 mmol/L (ref 22–32)
Calcium: 9.9 mg/dL (ref 8.9–10.3)
Chloride: 106 mmol/L (ref 98–111)
Creatinine, Ser: 0.91 mg/dL (ref 0.44–1.00)
GFR calc Af Amer: >60 mL/min (ref >60)
GFR calc non Af Amer: >60 mL/min (ref >60)
GLUCOSE: 87 mg/dL (ref 70–99)
Potassium: 4.3 mmol/L (ref 3.5–5.1)
SODIUM: 143 mmol/L (ref 135–145)
Total Bilirubin: 0.4 mg/dL (ref 0.3–1.2)
Total Protein: 7.7 g/dL (ref 6.5–8.1)

## 2018-08-25 MED ORDER — ZOLEDRONIC ACID 4 MG/100ML IV SOLN
4.0000 mg | Freq: Once | INTRAVENOUS | Status: AC
  Administered 2018-08-25: 4 mg via INTRAVENOUS
  Filled 2018-08-25: qty 100

## 2018-08-25 MED ORDER — SODIUM CHLORIDE 0.9 % IV SOLN
Freq: Once | INTRAVENOUS | Status: AC
  Administered 2018-08-25: 12:00:00 via INTRAVENOUS
  Filled 2018-08-25: qty 250

## 2018-08-25 NOTE — Progress Notes (Signed)
ID: [name]: August 15, 1960  MR#: [id number]  [name]#:[id number]  PCP: Elizabeth Ada, Gonzalez GYN:   [name]]; [name]: [name], [name], [name], [name], [name]; [name]; [name]  CHIEF COMPLAINT: Estrogen receptor positive breast cancer  CURRENT TREATMENT: Letrozole, zolendronate  HISTORY OF PRESENT ILLNESS: From the original intake node:  [name] had a routine colonoscopy under Dr. Paulita Gonzalez approximately a year and a half ago, showing a right colon lesion which could not be removed intraoperatively. CT scans of the abdomen and pelvis 07/07/2011 were negative except for the question of a soft tissue lesion in the right colon, and particularly there was no adenopathy or evidence of metastatic disease. She underwent partial colectomy under Dr. Madilyn Gonzalez 07/28/2011, and the pathology from that procedure ([id number]) showed a 3.2 cm tubular adenoma without high-grade dysplasia or malignancy. Margins were negative. 0 of 16 lymph nodes were involved.  And the patient has been working at weight loss and managed to lose approximately 25 pounds over the past year. She feels this is what allowed her to palpate a mass in her right breast earlier this month. (She had had negative mammographic screening 02/26/2012). On 10/27/2012 the patient underwent bilateral diagnostic mammography. The breasts are "extremely dense". Mammography did not show any new or worrisome finding. Right breast ultrasound however located an irregular hypoechoic mass in the area of the patient's palpable abnormality. It measured approximately 7 mm. Biopsy of this mass 10/28/2012 showed an invasive ductal carcinoma, grade 2, estrogen and progesterone receptor positive, with no HER-2 amplification, and an MIB-1 of 5%.  Bilateral breast MRI 11/01/2012 found a 1.3 cm irregular enhancing mass in the  lower inner quadrant of the right breast, with no other masses of concern in either breast and no abnormal appearing lymph nodes.  The patient's subsequent history is as detailed below.  INTERVAL HISTORY: [name] returns today for follow-up and treatment of her estrogen receptor positive breast cancer. Since her last visit she stopped taking Letrozole.  She had finished her five years of Letrozole therapy.    Due to worsening osteoporosis, she will start Zolendronate today.    She underwent a colonoscopy this week and the results were faxed to [name].  She also had her PCP visit and was told she has elevated cholesterol and that she needs to exercise.  She saw GYN in late February, 2020 and everything was normal.    Since her last visit she underwent bilateral mammogram in 01/2018 that showed no evidence of malignancy and breast density D.    REVIEW OF SYSTEMS: [name] has had some vaginal dryness.  She is using a vaginal moisturizer.  She is not having an intercourse at this point, but wants to make sure she doesn't have any dryness or dyspareunia.  She denies any new issues today such as breast changes, pain, fevers, chills, or unintentional weight loss.  She hasn't noted headaches or vision changes.  She is without lymphadenopathy, nausea, vomiting, dysphagia, indigestion, bowel/bladder changes.  She denies chest pain, palpitations, cough, shortness of breath.  A detailed ROS was otherwise non contributory.    PAST MEDICAL HISTORY: Past Medical History:  Diagnosis Date  . Anxiety   . Asthma    as a child, early adult...none now  . BRCA negative 06/2013   BRCA I & II negative  . Breast cancer ([name]) 10/28/12   right breast  .  Colonic mass 07/28/2011   ascending colon and found a polyp 2.5 cm adenoma - hemicoloectomy  . Depression   . GERD (gastroesophageal reflux disease)    occas  at  night....twice a  month--otc meds  . Recurrent upper respiratory infection (URI)    started  1/28.....getting better  . S/P chemotherapy, time since greater than 12 weeks     PAST SURGICAL HISTORY: Past Surgical History:  Procedure Laterality Date  . APPENDECTOMY    . BREAST BIOPSY Right 10/28/12   Invasive mammary ca, ER/PR=+, Her2 Neu-  . ENDOMETRIAL ABLATION  2006   HTA  . HEMICOLECTOMY  07/28/11  . TUBAL LIGATION  2006    FAMILY HISTORY Family History  Problem Relation Age of Onset  . Heart disease Father   . Cancer Paternal Aunt        unknown form of cancer  . Lung cancer Maternal Grandmother   . Testicular cancer Cousin        paternal cousin; died in 23s  . Pancreatic cancer Cousin 31  . Colon cancer Other        paternal grandmother's sister  . Cancer Other        MGM's sister   the patient's father died from congestive far to failure at the age of 9. The patient's mother is still living, in her early 42s. The patient has one brother, 4 sisters. There is no history of breast or ovarian cancer in the family to the patient's knowledge.  GYNECOLOGIC HISTORY:  Menarche age 75, first live birth age 25. The patient's less. It was September 2013 and the one before that was March 2013. She is not taking hormone replacement. She did take oral contraceptives for several years intermittently in the past. There were no complications.   SOCIAL HISTORY:  (Updated August 2018) [name] is originally from [place]. 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 She is not a counselor herself, but her husband, Millee Denise (goes by "[name]") works as a Social worker for the [employer] as well as having his own clinic in [place]. He is originally from [place]. As of August 2018 they are in process of separation. The patient's children are [name], who lives in [place] and has a Oceanographer in counseling; and Ekron Cellar, who is working in Engineer, technical sales. Both of them are now married and have moved out of the house. The patient  has 2 grandchildren. [place] Cellar shares custody of his daughter, who is 42 years old.    ADVANCED DIRECTIVES: In place   HEALTH MAINTENANCE: Social History   Tobacco Use  . Smoking status: Never Smoker  . Smokeless tobacco: Never Used  Substance Use Topics  . Alcohol use: Yes    Comment: wine occasionally  . Drug use: No     Colonoscopy: 06/02/2011 / [facility]  PAP:  Bone density: 05/19/2017 at [facility] showed a T score of -3.1 osteoporosis.  Lipid panel:  Allergies  Allergen Reactions  . Morphine And Related Itching       Vitals:   08/25/18 0950  BP: 132/82  Pulse: 84  Resp: 18  Temp: 98.3 F (36.8 C)  SpO2: 100%     Body mass index is 23.7 kg/m.    ECOG FS: 1 Filed Weights   08/25/18 0950  Weight: 133 lb 12.8 oz (60.7 kg)   OBJECTIVE: Middle-aged white woman in no acute distress GENERAL: Patient is a well appearing female in no acute distress HEENT:  Sclerae  anicteric.  Oropharynx clear and moist. No ulcerations or evidence of oropharyngeal candidiasis. Neck is supple.  NODES:  No cervical, supraclavicular, or axillary lymphadenopathy palpated.  BREAST EXAM:  Right breast s/p lumpectomy and radiation, no sign of local recurrence, left breast benign LUNGS:  Clear to auscultation bilaterally.  No wheezes or rhonchi. HEART:  Regular rate and rhythm. No murmur appreciated. ABDOMEN:  Soft, nontender.  Positive, normoactive bowel sounds. No organomegaly palpated. MSK:  No focal spinal tenderness to palpation. Full range of motion bilaterally in the upper extremities. EXTREMITIES:  No peripheral edema.   SKIN:  Clear with no obvious rashes or skin changes. No nail dyscrasia. NEURO:  Nonfocal. Well oriented.  Appropriate affect.    LAB RESULTS:   Lab Results  Component Value Date   WBC 4.6 08/25/2018   NEUTROABS 2.7 08/25/2018   HGB 13.0 08/25/2018   HCT 41.4 08/25/2018   MCV 89.8 08/25/2018   PLT 260 08/25/2018      Chemistry      Component Value Date/Time    NA 143 08/25/2018 0926   NA 139 02/09/2017 0911   K 4.3 08/25/2018 0926   K 4.0 02/09/2017 0911   CL 106 08/25/2018 0926   CL 102 11/03/2012 0821   CO2 23 08/25/2018 0926   CO2 26 02/09/2017 0911   BUN 14 08/25/2018 0926   BUN 15.5 02/09/2017 0911   CREATININE 0.91 08/25/2018 0926   CREATININE 0.9 02/09/2017 0911      Component Value Date/Time   CALCIUM 9.9 08/25/2018 0926   CALCIUM 9.9 02/09/2017 0911   ALKPHOS 50 08/25/2018 0926   ALKPHOS 60 02/09/2017 0911   AST 18 08/25/2018 0926   AST 17 02/09/2017 0911   ALT 18 08/25/2018 0926   ALT 14 02/09/2017 0911   BILITOT 0.4 08/25/2018 0926   BILITOT 0.45 02/09/2017 0911       STUDIES: Since her last visit, she completed a bilateral breast MRI on 05/05/2017 showing no malignancy in either breast.  Mammography at [facility] 02/03/2017 found the breast density to be category D. There was no mammographic evidence of malignancy.   Bone density at [facility] on 05/19/2017 showed a T score of -3.1 osteoporosis.  This is a minimal change compared to prior  ASSESSMENT: 58 y.o. [place] woman status post right breast lower inner quadrant biopsy 10/28/2012 for a clinical T1c N0, stage IA invasive ductal carcinoma, grade 2, estrogen and progesterone receptor positive, HER-2 not amplified, with an MIB-1 of 20%  (0) Jamia's genetics show a VUS, namely TP53 c.[id number]>T.    (1) s/p Right lumpectomy and sentinel lymph node sampling 11/17/2012 for a pT2 pN0, stage IIA invasive ductal carcinoma with prominent lobular features, grade 2, with initially positive  margins cleared intraoperatively; estrogen receptor positive with an Allred score of 7, progesterone receptor positive with an Allred score of 8, HercepTest 0, proliferation index by ACIS III 32% ([id number])   (2) Oncotype DX score of 33 predicting a risk of distant recurrence within 10 years of 23% if her only systemic treatment is tamoxifen for 5 years; the estrogen receptor was interpreted as  negative on the Oncotype report  (3) adjuvant cyclophosphamide and docetaxel started 12/30/2012, first cycle given at [facility], with the remaining cycles given  at [facility] and completed 4th cycle 03/03/2013  (4)  adjuvant radiation therapy completed 05/10/2013  (5) started tamoxifen January 2015, stopped March 2015 due to depression  (6) anastrozole started 11/12/2013, discontinued 02/24/2014 with significant arthralgias/myalgias, resumed November  2015 with no improvement in the arthralgias myalgias, again discontinued  (7) exemestane started November 2015  (a) held 03/14/2015 because of arthralgias and myalgias.  (8) letrozole started 05/16/2015 stopped 02/2018 by patient due to depletion of calcium   (a) bone density 04/23/2015 at [facility] shows osteoporosis with a T score of -3.0  (b) ibandronate started 02/15/2016   (c) bone density 05/19/2017 at [facility] showed a T score of -3.1 osteoporosis.  (d) Zolendronate to start on 08/25/2018  PLAN:  Chalisa is doing well today.  She stopped taking letrozole early due to her increasing osteoporosis. She felt like she had been on anti estrogen therapy long enough.  She does however have increasing osteoporosis.  She will receive Zolendronate today. I reviewed with her the risks and benefits, including osteonecrosis of the jaw.  She is agreeable to proceed.  We reviewed calcium intake recommendations for this.    Verlaine was recommended healthy diet and exercise.  She has done well keeping up with her health maintenance. I recommended that she see her dentist regularly while receiving Zolendronate.    Jillann will return in 6 months for labs and f/u along with Zolendronate.  She knows to call for any problems that may develop before the next visit.  A total of (20) minutes of face-to-face time was spent with this patient with greater than 50% of that time in counseling and care-coordination.   Wilber Bihari, NP  08/25/18 2:45 PM Medical Oncology and  Hematology [facility] [address] [place], [place] [address] Tel. [phone]    Fax. [phone]

## 2018-08-25 NOTE — Patient Instructions (Signed)

## 2018-08-25 NOTE — Patient Instructions (Signed)

## 2018-08-25 NOTE — Progress Notes (Signed)
Pt given zometa today per NP Mendel Ryder.  Tolerated well.  VSS.  Consent signed and filed.  Pt denies any further questions/concerns at d/c but VU that she can call with any issues.

## 2018-08-26 ENCOUNTER — Other Ambulatory Visit: Payer: 59

## 2018-08-26 ENCOUNTER — Ambulatory Visit: Payer: 59 | Admitting: Oncology

## 2018-08-26 ENCOUNTER — Ambulatory Visit: Payer: BLUE CROSS/BLUE SHIELD

## 2018-08-27 ENCOUNTER — Ambulatory Visit: Payer: Self-pay | Admitting: Adult Health

## 2018-08-27 ENCOUNTER — Other Ambulatory Visit: Payer: BLUE CROSS/BLUE SHIELD

## 2018-08-27 ENCOUNTER — Ambulatory Visit: Payer: BLUE CROSS/BLUE SHIELD

## 2018-09-09 DIAGNOSIS — F4320 Adjustment disorder, unspecified: Secondary | ICD-10-CM | POA: Diagnosis not present

## 2018-10-03 ENCOUNTER — Other Ambulatory Visit: Payer: Self-pay | Admitting: Psychiatry

## 2018-10-17 ENCOUNTER — Other Ambulatory Visit: Payer: Self-pay

## 2018-10-17 MED ORDER — ARIPIPRAZOLE 5 MG PO TABS: 5.0000 mg | ORAL_TABLET | Freq: Every day | ORAL | 1 refills | Status: DC

## 2018-11-04 DIAGNOSIS — F4322 Adjustment disorder with anxiety: Secondary | ICD-10-CM | POA: Diagnosis not present

## 2018-11-16 ENCOUNTER — Other Ambulatory Visit: Payer: Self-pay | Admitting: Psychiatry

## 2018-11-22 ENCOUNTER — Other Ambulatory Visit: Payer: Self-pay

## 2018-11-22 ENCOUNTER — Ambulatory Visit: Payer: BLUE CROSS/BLUE SHIELD | Admitting: Psychiatry

## 2018-11-22 ENCOUNTER — Encounter: Payer: Self-pay | Admitting: Psychiatry

## 2018-11-22 DIAGNOSIS — F900 Attention-deficit hyperactivity disorder, predominantly inattentive type: Secondary | ICD-10-CM | POA: Diagnosis not present

## 2018-11-22 DIAGNOSIS — F331 Major depressive disorder, recurrent, moderate: Secondary | ICD-10-CM | POA: Diagnosis not present

## 2018-11-22 DIAGNOSIS — F411 Generalized anxiety disorder: Secondary | ICD-10-CM | POA: Diagnosis not present

## 2018-11-22 MED ORDER — LISDEXAMFETAMINE DIMESYLATE 50 MG PO CAPS: 50.0000 mg | ORAL_CAPSULE | Freq: Every day | ORAL | 0 refills | Status: DC

## 2018-11-22 MED ORDER — ARIPIPRAZOLE 2 MG PO TABS: 2.0000 mg | ORAL_TABLET | Freq: Every day | ORAL | 1 refills | Status: DC

## 2018-11-22 MED ORDER — DIAZEPAM 5 MG PO TABS: 5.0000 mg | ORAL_TABLET | Freq: Every evening | ORAL | 1 refills | Status: DC | PRN

## 2018-11-22 NOTE — Progress Notes (Signed)
[name] [id number] [birth year] 58 y.o.  Subjective:   Patient ID:  [name] is a 58 y.o. (DOB 03-19-1961) female.  Chief Complaint:  Chief Complaint  Patient presents with  . Anxiety    Medication Management  . Depression    Medication Management  . Follow-up    Medication Management    Anxiety  Patient reports no confusion, decreased concentration, nervous/anxious behavior or suicidal ideas.    Depression         Associated symptoms include no decreased concentration and no suicidal ideas.  Past medical history includes anxiety.    [name] presents to the office today for follow-up of depression and ADD.  Last seen August 23, 2018.  She had reduced the Abilify to 5 mg and was doing well.  She wanted to try to taper off of that medication.  Doing fine with reduction Abilify to 5 mg and wants to reduce it again.  No worse depression.  Other stressors have occurred related to the marriage.  Anxiety is still manageable.  Sleep 7 hours.  Sometimes goes to bed to late.  Work from home and a lot.  Mother living with her until Jan 03, 2023.  Oldes sister passed away and she was a caregiver.  Adjusting but been ok.  Reduced to  5 mg beginning of Feb.  Started it Jan 02, 2017.    Pleased with Vyvanse.  Diazepam mainly used for sleep and it helps with Flexeril.  Past Psychiatric Medication Trials: Temazepam, clonazepam, Ambien no response, Flexeril, Abilify 10, diazepam, Vyvanse, bupropion 450 tremor, buspirone 30 twice daily Evekeo, lithium, lamotrigine, venlafaxine hypomania, fluoxetine, Adderall   Review of Systems:  Review of Systems  Neurological: Negative for tremors and weakness.  Psychiatric/Behavioral: Positive for depression. Negative for agitation, behavioral problems, confusion, decreased concentration, dysphoric mood, hallucinations, self-injury, sleep disturbance and suicidal ideas. The patient is not nervous/anxious and is not hyperactive.      Medications: I have reviewed the patient's current medications.  Current Outpatient Medications  Medication Sig Dispense Refill  . ARIPiprazole (ABILIFY) 2 MG tablet Take 1 tablet (2 mg total) by mouth daily. 30 tablet 1  . buPROPion (WELLBUTRIN XL) 300 MG 24 hr tablet TAKE 1 TABLET BY MOUTH EVERY DAY 90 tablet 0  . Cholecalciferol (VITAMIN D3) 5000 units CAPS Take by mouth.    . cyclobenzaprine (FLEXERIL) 10 MG tablet Take 10 mg by mouth 3 (three) times daily as needed for muscle spasms.    . diazepam (VALIUM) 5 MG tablet Take 1-2 tablets (5-10 mg total) by mouth at bedtime as needed. for sleep 60 tablet 1  . lisdexamfetamine (VYVANSE) 50 MG capsule Take 1 capsule (50 mg total) by mouth daily. 30 capsule 0  . [START ON 12/20/2018] lisdexamfetamine (VYVANSE) 50 MG capsule Take 1 capsule (50 mg total) by mouth daily. 30 capsule 0  . [START ON 01/17/2019] lisdexamfetamine (VYVANSE) 50 MG capsule Take 1 capsule (50 mg total) by mouth daily. 30 capsule 0   No current facility-administered medications for this visit.     Medication Side Effects: None  Allergies:  Allergies  Allergen Reactions  . Morphine And Related Itching    Past Medical History:  Diagnosis Date  . Anxiety   . Asthma    as a child, early adult...none now  . BRCA negative 06/2013   BRCA I & II negative  . Breast cancer ([place]) 10/28/12   right breast  . Colonic mass 07/28/2011   ascending colon and  found a polyp 2.5 cm adenoma - hemicoloectomy  . Depression   . GERD (gastroesophageal reflux disease)    occas  at  night....twice a  month--otc meds  . Recurrent upper respiratory infection (URI)    started 1/28.....getting better  . S/P chemotherapy, time since greater than 12 weeks     Family History  Problem Relation Age of Onset  . Heart disease Father   . Cancer Paternal Aunt        unknown form of cancer  . Lung cancer Maternal Grandmother   . Testicular cancer Cousin        paternal cousin; died in 84s   . Pancreatic cancer Cousin 12  . Colon cancer Other        paternal grandmother's sister  . Cancer Other        MGM's sister    Social History   Socioeconomic History  . Marital status: Married    Spouse name: [name]  . Number of children: 2  . Years of education: Not on file  . Highest education level: Not on file  Occupational History    Employer: [employer]  . Financial resource strain: Not on file  . Food insecurity:    Worry: Not on file    Inability: Not on file  . Transportation needs:    Medical: Not on file    Non-medical: Not on file  Tobacco Use  . Smoking status: Never Smoker  . Smokeless tobacco: Never Used  Substance and Sexual Activity  . Alcohol use: Yes    Comment: wine occasionally  . Drug use: No  . Sexual activity: Yes    Birth control/protection: Surgical  Lifestyle  . Physical activity:    Days per week: Not on file    Minutes per session: Not on file  . Stress: Not on file  Relationships  . Social connections:    Talks on phone: Not on file    Gets together: Not on file    Attends religious service: Not on file    Active member of club or organization: Not on file    Attends meetings of clubs or organizations: Not on file    Relationship status: Not on file  . Intimate partner violence:    Fear of current or ex partner: Not on file    Emotionally abused: Not on file    Physically abused: Not on file    Forced sexual activity: Not on file  Other Topics Concern  . Not on file  Social History Narrative  . Not on file    Past Medical History, Surgical history, Social history, and Family history were reviewed and updated as appropriate.   Please see review of systems for further details on the patient's review from today.   Objective:   Physical Exam:  There were no vitals taken for this visit.  Physical Exam Constitutional:      General: She is not in acute distress.    Appearance: She is well-developed.   Musculoskeletal:        General: No deformity.  Neurological:     Mental Status: She is alert and oriented to person, place, and time.     Motor: No tremor.     Coordination: Coordination normal.     Gait: Gait normal.  Psychiatric:        Attention and Perception: She is attentive.        Mood and Affect: Mood is not anxious or  depressed. Affect is not labile, blunt, angry or inappropriate.        Speech: Speech normal.        Behavior: Behavior normal.        Thought Content: Thought content normal. Thought content does not include homicidal or suicidal ideation. Thought content does not include homicidal or suicidal plan.        Judgment: Judgment normal.     Comments: Insight intact. No auditory or visual hallucinations. No delusions.      Lab Review:     Component Value Date/Time   NA 143 08/25/2018 0926   NA 139 02/09/2017 0911   K 4.3 08/25/2018 0926   K 4.0 02/09/2017 0911   CL 106 08/25/2018 0926   CL 102 11/03/2012 0821   CO2 23 08/25/2018 0926   CO2 26 02/09/2017 0911   GLUCOSE 87 08/25/2018 0926   GLUCOSE 104 02/09/2017 0911   GLUCOSE 93 11/03/2012 0821   BUN 14 08/25/2018 0926   BUN 15.5 02/09/2017 0911   CREATININE 0.91 08/25/2018 0926   CREATININE 0.9 02/09/2017 0911   CALCIUM 9.9 08/25/2018 0926   CALCIUM 9.9 02/09/2017 0911   PROT 7.7 08/25/2018 0926   PROT 7.4 02/09/2017 0911   ALBUMIN 4.3 08/25/2018 0926   ALBUMIN 4.1 02/09/2017 0911   AST 18 08/25/2018 0926   AST 17 02/09/2017 0911   ALT 18 08/25/2018 0926   ALT 14 02/09/2017 0911   ALKPHOS 50 08/25/2018 0926   ALKPHOS 60 02/09/2017 0911   BILITOT 0.4 08/25/2018 0926   BILITOT 0.45 02/09/2017 0911   GFRNONAA >60 08/25/2018 0926   GFRAA >60 08/25/2018 0926       Component Value Date/Time   WBC 4.6 08/25/2018 0926   RBC 4.61 08/25/2018 0926   HGB 13.0 08/25/2018 0926   HGB 12.6 02/09/2017 0911   HCT 41.4 08/25/2018 0926   HCT 38.8 02/09/2017 0911   PLT 260 08/25/2018 0926   PLT 295  02/09/2017 0911   MCV 89.8 08/25/2018 0926   MCV 87.4 02/09/2017 0911   MCH 28.2 08/25/2018 0926   MCHC 31.4 08/25/2018 0926   RDW 13.4 08/25/2018 0926   RDW 14.2 02/09/2017 0911   LYMPHSABS 1.3 08/25/2018 0926   LYMPHSABS 1.6 02/09/2017 0911   MONOABS 0.4 08/25/2018 0926   MONOABS 0.5 02/09/2017 0911   EOSABS 0.1 08/25/2018 0926   EOSABS 0.1 02/09/2017 0911   BASOSABS 0.0 08/25/2018 0926   BASOSABS 0.1 02/09/2017 0911    No results found for: POCLITH, LITHIUM   No results found for: PHENYTOIN, PHENOBARB, VALPROATE, CBMZ   .res Assessment: Plan:    Major depressive disorder, recurrent episode, moderate (HCC)  Attention deficit hyperactivity disorder (ADHD), predominantly inattentive type - Plan: lisdexamfetamine (VYVANSE) 50 MG capsule, lisdexamfetamine (VYVANSE) 50 MG capsule, lisdexamfetamine (VYVANSE) 50 MG capsule  Generalized anxiety disorder   She is markedly better than previous times when she was seen.  Part of this is progressing and resolving the end of her marriage.  Her depression is better controlled ended her anxiety is also better controlled.  She wonders about reducing some of the medication.  She is tolerated the reduction Wellbutrin fine and her tremor is markedly better with a lower dose.  She did fine with the reduction in Abilify to 5 mg daily.  Discussed potential metabolic side effects associated with atypical antipsychotics, as well as potential risk for movement side effects. Advised pt to contact office if movement side effects occur.  reduce abilify to 2 mg for 2 mos and stop it.  Disc risk of relapse and call if a problem.  Discussed potential benefits, risks, and side effects of stimulants with patient to include increased heart rate, palpitations, insomnia, increased anxiety, increased irritability, or decreased appetite.  Instructed patient to contact office if experiencing any significant tolerability issues.  She is satisfied with the Vyvanse.   Disc ADD.  She clearly got it from father.  We discussed the short-term risks associated with benzodiazepines including sedation and increased fall risk among others.  Discussed long-term side effect risk including dependence, potential withdrawal symptoms, and the potential eventual dose-related risk of dementia.  She agrees to the plan  Follow-up 3 months  Lynder Parents MD, DFAPA   Please see After Visit Summary for patient specific instructions.  Future Appointments  Date Time Provider [place]  02/22/2019  9:00 AM [facility] 1 [facility] [place]  02/24/2019  9:30 AM [facility] LAB 5 [facility] None  02/24/2019 10:00 AM Magrinat, Virgie Dad, MD [facility] None  02/24/2019 11:00 AM [facility] INFUSION [facility] None    No orders of the defined types were placed in this encounter.     -------------------------------

## 2018-11-22 NOTE — Patient Instructions (Signed)
Take Abilify 2 mg daily for 2 months and if doing well stop it.

## 2018-12-10 DIAGNOSIS — F4322 Adjustment disorder with anxiety: Secondary | ICD-10-CM | POA: Diagnosis not present

## 2018-12-13 ENCOUNTER — Other Ambulatory Visit: Payer: Self-pay

## 2018-12-13 MED ORDER — CYCLOBENZAPRINE HCL 10 MG PO TABS: 10.0000 mg | ORAL_TABLET | Freq: Three times a day (TID) | ORAL | 2 refills | Status: DC | PRN

## 2018-12-14 ENCOUNTER — Other Ambulatory Visit: Payer: Self-pay | Admitting: Psychiatry

## 2019-01-14 DIAGNOSIS — F4322 Adjustment disorder with anxiety: Secondary | ICD-10-CM | POA: Diagnosis not present

## 2019-02-08 ENCOUNTER — Encounter: Payer: Self-pay | Admitting: Oncology

## 2019-02-10 ENCOUNTER — Other Ambulatory Visit: Payer: Self-pay

## 2019-02-10 MED ORDER — BUPROPION HCL ER (XL) 300 MG PO TB24: 300.0000 mg | ORAL_TABLET | Freq: Every day | ORAL | 0 refills | Status: DC

## 2019-02-11 DIAGNOSIS — F4322 Adjustment disorder with anxiety: Secondary | ICD-10-CM | POA: Diagnosis not present

## 2019-02-15 ENCOUNTER — Encounter: Payer: Self-pay | Admitting: Adult Health

## 2019-02-22 ENCOUNTER — Ambulatory Visit (HOSPITAL_COMMUNITY)
Admission: RE | Admit: 2019-02-22 | Discharge: 2019-02-22 | Disposition: A | Discharge status: Home / Self Care | Payer: BC Managed Care – PPO | Source: Ambulatory Visit | Attending: Adult Health | Admitting: Adult Health

## 2019-02-22 ENCOUNTER — Other Ambulatory Visit: Payer: Self-pay

## 2019-02-22 DIAGNOSIS — Z853 Personal history of malignant neoplasm of breast: Secondary | ICD-10-CM | POA: Diagnosis not present

## 2019-02-22 DIAGNOSIS — C50311 Malignant neoplasm of lower-inner quadrant of right female breast: Secondary | ICD-10-CM | POA: Diagnosis not present

## 2019-02-22 DIAGNOSIS — R922 Inconclusive mammogram: Secondary | ICD-10-CM | POA: Diagnosis not present

## 2019-02-22 DIAGNOSIS — Z170 Estrogen receptor positive status [ER+]: Secondary | ICD-10-CM | POA: Insufficient documentation

## 2019-02-22 DIAGNOSIS — Z1239 Encounter for other screening for malignant neoplasm of breast: Secondary | ICD-10-CM | POA: Diagnosis not present

## 2019-02-22 DIAGNOSIS — Z1231 Encounter for screening mammogram for malignant neoplasm of breast: Secondary | ICD-10-CM

## 2019-02-22 MED ORDER — GADOBUTROL 1 MMOL/ML IV SOLN
6.0000 mL | Freq: Once | INTRAVENOUS | Status: AC | PRN
  Administered 2019-02-22: 10:00:00 6 mL via INTRAVENOUS

## 2019-02-23 ENCOUNTER — Telehealth: Payer: Self-pay

## 2019-02-23 NOTE — Progress Notes (Signed)
ID: [name] OB: 09/16/1960  MR#: [id number]  CSN#:[id number]  Patient Care Team: Carol Ada, MD as PCP - General (Family Medicine) Paralee Cancel, MD as Consulting Physician (Orthopedic Surgery) Magrinat, Virgie Dad, MD as Consulting Physician (Oncology) Cottle, Billey Co., MD as Consulting Physician (Psychiatry) OTHER MD:   CHIEF COMPLAINT: Estrogen receptor positive breast cancer  CURRENT TREATMENT: zoledronate   INTERVAL HISTORY: [name] returns today for follow-up and treatment of her estrogen receptor positive breast cancer.  She continues on Zolendronate yearly.  She has no side effects from this that she is aware of  Her most recent bone density screening was on 05/19/2017 at [facility] This showed a T-score of -3.1.  Since her last visit, she underwent bilateral diagnostic mammography with tomography at [facility] on 02/11/2019 showing: breast density category B; no evidence of malignancy in either breast.  She also underwent bilateral breast MRI on 02/22/2019, which showed: breast composition D; no MRI evidence of breast malignancy.   REVIEW OF SYSTEMS: [name] continues to work full-time.  She sees Dr. Charlott Holler every 3 to 4 months and they are trying to drop her Abilify and Wellbutrin doses.  She exercises chiefly by walking and about 3 days a week she gets more than 10,000 steps.  There are also some nice social developments which I am not otherwise detailing here.  A detailed review of systems today was otherwise stable   PAST MEDICAL HISTORY: Past Medical History:  Diagnosis Date  . Anxiety   . Asthma    as a child, early adult...none now  . BRCA negative 06/2013   BRCA I & II negative  . Breast cancer ([place]) 10/28/12   right breast  . Colonic mass 07/28/2011   ascending colon and found a polyp 2.5 cm adenoma - hemicoloectomy  . Depression   . GERD (gastroesophageal reflux disease)    occas  at  night....twice a  month--otc meds  . Recurrent upper respiratory infection  (URI)    started 1/28.....getting better  . S/P chemotherapy, time since greater than 12 weeks      PAST SURGICAL HISTORY: Past Surgical History:  Procedure Laterality Date  . APPENDECTOMY    . BREAST BIOPSY Right 10/28/12   Invasive mammary ca, ER/PR=+, Her2 Neu-  . ENDOMETRIAL ABLATION  2006   HTA  . HEMICOLECTOMY  07/28/11  . TUBAL LIGATION  2006    FAMILY HISTORY Family History  Problem Relation Age of Onset  . Heart disease Father   . Cancer Paternal Aunt        unknown form of cancer  . Lung cancer Maternal Grandmother   . Testicular cancer Cousin        paternal cousin; died in 86s  . Pancreatic cancer Cousin 20  . Colon cancer Other        paternal grandmother's sister  . Cancer Other        MGM's sister   the patient's father died from congestive heart failure at the age of 42. The patient's mother is still living, in her early 65s. The patient has one brother, 4 sisters. There is no history of breast or ovarian cancer in the family to the patient's knowledge.   GYNECOLOGIC HISTORY:  Menarche age 24, first live birth age 35. The patient's less. It was September 2013 and the one before that was March 2013. She is not taking hormone replacement. She did take oral contraceptives for several years intermittently in the past. There were no complications.  SOCIAL HISTORY:  (Updated September 2020 [name] is originally from [place]. 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 She is not a counselor herself, but her former husband, Donyelle Enyeart (goes by "[name]") works as a Social worker for the [employer] as well as having his own clinic in [place]. He is originally from [place].  They divorced over 2019.  The patient's children are [name], who lives in [place] and has a Masters in counseling; she has 1 daughter; and [place] Cellar, who is working in Engineer, technical sales and has 2 daughters, the younger one [address] [address] 58 years  old being worked up for possible mild autism, the older one 58 years old and he shares custody of her  ADVANCED DIRECTIVES:   HEALTH MAINTENANCE: Social History   Tobacco Use  . Smoking status: Never Smoker  . Smokeless tobacco: Never Used  Substance Use Topics  . Alcohol use: Yes    Comment: wine occasionally  . Drug use: No     Colonoscopy: 06/02/2011 / [facility]  PAP:  Bone density: 05/19/2017 at [facility] showed a T score of -3.1 osteoporosis.  Lipid panel:   Allergies  Allergen Reactions  . Morphine And Related Itching    Vitals:   02/24/19 1022  BP: 132/81  Pulse: 92  Resp: 18  Temp: 98.9 F (37.2 C)  SpO2: 100%     Body mass index is 23.95 kg/m.    ECOG FS: 1 Filed Weights   02/24/19 1022  Weight: 135 lb 3.2 oz (61.3 kg)   OBJECTIVE: Middle-aged white woman who appears well  Sclerae unicteric, EOMs intact Wearing a mask No cervical or supraclavicular adenopathy Lungs no rales or rhonchi Heart regular rate and rhythm Abd soft, nontender, positive bowel sounds MSK no focal spinal tenderness, no upper extremity lymphedema Neuro: nonfocal, well oriented, appropriate affect Breasts: Status post right lumpectomy followed by radiation.  There is no evidence of disease recurrence.  The left breast is benign.  Both axillae are benign.   LAB RESULTS:   Lab Results  Component Value Date   WBC 6.7 02/24/2019   NEUTROABS 3.9 02/24/2019   HGB 13.2 02/24/2019   HCT 40.4 02/24/2019   MCV 87.8 02/24/2019   PLT 324 02/24/2019      Chemistry      Component Value Date/Time   NA 142 02/24/2019 0939   NA 139 02/09/2017 0911   K 4.4 02/24/2019 0939   K 4.0 02/09/2017 0911   CL 105 02/24/2019 0939   CL 102 11/03/2012 0821   CO2 27 02/24/2019 0939   CO2 26 02/09/2017 0911   BUN 21 (H) 02/24/2019 0939   BUN 15.5 02/09/2017 0911   CREATININE 0.91 02/24/2019 0939   CREATININE 0.9 02/09/2017 0911      Component Value Date/Time   CALCIUM 10.1 02/24/2019 0939    CALCIUM 9.9 02/09/2017 0911   ALKPHOS 45 02/24/2019 0939   ALKPHOS 60 02/09/2017 0911   AST 20 02/24/2019 0939   AST 17 02/09/2017 0911   ALT 16 02/24/2019 0939   ALT 14 02/09/2017 0911   BILITOT 0.3 02/24/2019 0939   BILITOT 0.45 02/09/2017 0911       STUDIES: Mr Breast Bilateral W Wo Contrast Inc Cad  Result Date: 02/23/2019 CLINICAL DATA:  58 year old female with high lifetime risk for developing breast cancer for screening breast MR. History of RIGHT breast cancer in 2014 with lumpectomy and radiation therapy. LABS:  None performed today EXAM: BILATERAL BREAST MRI WITH  AND WITHOUT CONTRAST TECHNIQUE: Multiplanar, multisequence MR images of both breasts were obtained prior to and following the intravenous administration of 6 ml of Gadavist Three-dimensional MR images were rendered by post-processing of the original MR data on an independent workstation. The three-dimensional MR images were interpreted, and findings are reported in the following complete MRI report for this study. Three dimensional images were evaluated at the independent DynaCad workstation COMPARISON:  02/11/2019 and prior outside mammograms. 05/05/2017 and prior breast MRs FINDINGS: Breast composition: d. Extreme fibroglandular tissue. Background parenchymal enhancement: Minimal Right breast: No mass or abnormal enhancement. Surgical changes within the [facility] RIGHT breast again noted. Left breast: No mass or abnormal enhancement. Lymph nodes: No abnormal appearing lymph nodes. Ancillary findings:  Surgical changes within the RIGHT axilla noted. IMPRESSION: 1. No MR evidence of breast malignancy. 2. RIGHT breast and axillary surgical changes RECOMMENDATION: Screening mammogram in one year.(Code:[id number]) Screening breast MRI in 1 year. BI-RADS CATEGORY  2: Benign. Electronically Signed   By: Margarette Canada M.D.   On: 02/23/2019 12:18    ASSESSMENT: 58 y.o. [place] woman status post right breast lower inner quadrant biopsy  10/28/2012 for a clinical T1c N0, stage IA invasive ductal carcinoma, grade 2, estrogen and progesterone receptor positive, HER-2 not amplified, with an MIB-1 of 20%  (0) [name]'s genetics show a VUS, namely TP53 c.[id number]>T.    (1) s/p Right lumpectomy and sentinel lymph node sampling 11/17/2012 for a pT2 pN0, stage IIA invasive ductal carcinoma with prominent lobular features, grade 2, with initially positive  margins cleared intraoperatively; estrogen receptor positive with an Allred score of 7, progesterone receptor positive with an Allred score of 8, HercepTest 0, proliferation index by ACIS III 32% ([id number])   (2) Oncotype DX score of 33 predicting a risk of distant recurrence within 10 years of 23% if her only systemic treatment is tamoxifen for 5 years; the estrogen receptor was interpreted as negative on the Oncotype report  (3) adjuvant cyclophosphamide and docetaxel started 12/30/2012, first cycle given at [facility], with the remaining cycles given  at [facility] and completed 4th cycle 03/03/2013  (4)  adjuvant radiation therapy completed 05/10/2013  (5) started tamoxifen January 2015, stopped March 2015 due to depression  (6) anastrozole started 11/12/2013, discontinued 02/24/2014 with significant arthralgias/myalgias, resumed November 2015 with no improvement in the arthralgias myalgias, again discontinued  (7) exemestane started November 2015  (a) held as of 03/14/2015 because of arthralgias and myalgias.  (8) letrozole started 05/16/2015 stopped 02/2018 by patient due to depletion of calcium   (a) bone density 04/23/2015 at [facility] shows osteoporosis with a T score of -3.0  (b) ibandronate started 02/15/2016, discontinued March 2019  (c) bone density 05/19/2017 at [facility] showed a T score of -3.1 osteoporosis.  (d) Zoledronate started on 08/25/2018, repeated every 6 months  PLAN:  [name] is now a little over 6 years out from definitive surgery for her breast cancer with no evidence of  disease recurrence.  This is very favorable.  Her breast continues to be very dense.  She has been paying out-of-pocket for yearly MRIs.  We are going to repeat her mammography in March of next year and then she will have an MRI of the breast 6 months later in September.  We will try to get an abbreviated MRI for her for financial reasons  I have commended her exercise program and encouraged her to extended to several days a week.  She knows to call for any other issue that may  develop before her next visit here. Virgie Dad. Magrinat, MD  02/24/19 10:58 AM Medical Oncology and Hematology [facility] [address] [place], [place] [address] Tel. [phone]    Fax. [phone]   I, Wilburn Mylar, am acting as scribe for Dr. Virgie Dad. Magrinat.  I, Lurline Del MD, have reviewed the above documentation for accuracy and completeness, and I agree with the above.

## 2019-02-23 NOTE — Telephone Encounter (Signed)
-----   Message from Gardenia Phlegm, NP sent at 02/23/2019 12:56 PM EDT ----- Please call patient and tell her mri shows no cancer in breasts ----- Message ----- From: Interface, Rad Results In Sent: 02/23/2019  12:20 PM EDT To: Gardenia Phlegm, NP

## 2019-02-23 NOTE — Telephone Encounter (Signed)
Spoke with patient informing that MRI shows no cancer in breast.  Patient voiced understanding and thanks for call.

## 2019-02-24 ENCOUNTER — Inpatient Hospital Stay: Payer: BC Managed Care – PPO

## 2019-02-24 ENCOUNTER — Inpatient Hospital Stay: Payer: BC Managed Care – PPO | Attending: Oncology

## 2019-02-24 ENCOUNTER — Inpatient Hospital Stay (HOSPITAL_BASED_OUTPATIENT_CLINIC_OR_DEPARTMENT_OTHER): Payer: BC Managed Care – PPO | Admitting: Oncology

## 2019-02-24 ENCOUNTER — Ambulatory Visit: Payer: BC Managed Care – PPO | Admitting: Psychiatry

## 2019-02-24 ENCOUNTER — Other Ambulatory Visit: Payer: Self-pay

## 2019-02-24 VITALS — BP 132/81 | HR 92 | Temp 98.9°F | Resp 18 | Wt 135.2 lb

## 2019-02-24 DIAGNOSIS — Z79899 Other long term (current) drug therapy: Secondary | ICD-10-CM | POA: Diagnosis not present

## 2019-02-24 DIAGNOSIS — M818 Other osteoporosis without current pathological fracture: Secondary | ICD-10-CM

## 2019-02-24 DIAGNOSIS — Z801 Family history of malignant neoplasm of trachea, bronchus and lung: Secondary | ICD-10-CM | POA: Diagnosis not present

## 2019-02-24 DIAGNOSIS — K219 Gastro-esophageal reflux disease without esophagitis: Secondary | ICD-10-CM | POA: Insufficient documentation

## 2019-02-24 DIAGNOSIS — C50911 Malignant neoplasm of unspecified site of right female breast: Secondary | ICD-10-CM | POA: Diagnosis not present

## 2019-02-24 DIAGNOSIS — F418 Other specified anxiety disorders: Secondary | ICD-10-CM | POA: Diagnosis not present

## 2019-02-24 DIAGNOSIS — Z170 Estrogen receptor positive status [ER+]: Secondary | ICD-10-CM

## 2019-02-24 DIAGNOSIS — Z923 Personal history of irradiation: Secondary | ICD-10-CM | POA: Insufficient documentation

## 2019-02-24 DIAGNOSIS — C50311 Malignant neoplasm of lower-inner quadrant of right female breast: Secondary | ICD-10-CM

## 2019-02-24 DIAGNOSIS — J45909 Unspecified asthma, uncomplicated: Secondary | ICD-10-CM | POA: Insufficient documentation

## 2019-02-24 LAB — CBC WITH DIFFERENTIAL/PLATELET
Abs Immature Granulocytes: 0.01 10*3/uL (ref 0.00–0.07)
Basophils Absolute: 0.1 10*3/uL (ref 0.0–0.1)
Basophils Relative: 1 %
Eosinophils Absolute: 0.3 10*3/uL (ref 0.0–0.5)
Eosinophils Relative: 4 %
HCT: 40.4 % (ref 36.0–46.0)
Hemoglobin: 13.2 g/dL (ref 12.0–15.0)
Immature Granulocytes: 0 %
Lymphocytes Relative: 29 %
Lymphs Abs: 2 10*3/uL (ref 0.7–4.0)
MCH: 28.7 pg (ref 26.0–34.0)
MCHC: 32.7 g/dL (ref 30.0–36.0)
MCV: 87.8 fL (ref 80.0–100.0)
Monocytes Absolute: 0.6 10*3/uL (ref 0.1–1.0)
Monocytes Relative: 9 %
Neutro Abs: 3.9 10*3/uL (ref 1.7–7.7)
Neutrophils Relative %: 57 %
Platelets: 324 10*3/uL (ref 150–400)
RBC: 4.6 MIL/uL (ref 3.87–5.11)
RDW: 13.5 % (ref 11.5–15.5)
WBC: 6.7 10*3/uL (ref 4.0–10.5)
nRBC: 0 % (ref 0.0–0.2)

## 2019-02-24 LAB — COMPREHENSIVE METABOLIC PANEL
ALT: 16 U/L (ref 0–44)
AST: 20 U/L (ref 15–41)
Albumin: 4.6 g/dL (ref 3.5–5.0)
Alkaline Phosphatase: 45 U/L (ref 38–126)
Anion gap: 10 (ref 5–15)
BUN: 21 mg/dL — ABNORMAL HIGH (ref 6–20)
CO2: 27 mmol/L (ref 22–32)
Calcium: 10.1 mg/dL (ref 8.9–10.3)
Chloride: 105 mmol/L (ref 98–111)
Creatinine, Ser: 0.91 mg/dL (ref 0.44–1.00)
GFR calc Af Amer: >60 mL/min (ref >60)
GFR calc non Af Amer: >60 mL/min (ref >60)
Glucose, Bld: 87 mg/dL (ref 70–99)
Potassium: 4.4 mmol/L (ref 3.5–5.1)
Sodium: 142 mmol/L (ref 135–145)
Total Bilirubin: 0.3 mg/dL (ref 0.3–1.2)
Total Protein: 7.5 g/dL (ref 6.5–8.1)

## 2019-02-24 MED ORDER — SODIUM CHLORIDE 0.9 % IV SOLN
INTRAVENOUS | Status: DC
  Administered 2019-02-24: 11:00:00 via INTRAVENOUS
  Filled 2019-02-24: qty 250

## 2019-02-24 MED ORDER — ZOLEDRONIC ACID 4 MG/100ML IV SOLN
4.0000 mg | Freq: Once | INTRAVENOUS | Status: AC
  Administered 2019-02-24: 4 mg via INTRAVENOUS
  Filled 2019-02-24: qty 100

## 2019-02-24 NOTE — Patient Instructions (Signed)

## 2019-02-25 DIAGNOSIS — M25561 Pain in right knee: Secondary | ICD-10-CM | POA: Diagnosis not present

## 2019-03-04 DIAGNOSIS — M25561 Pain in right knee: Secondary | ICD-10-CM | POA: Diagnosis not present

## 2019-03-09 ENCOUNTER — Other Ambulatory Visit: Payer: Self-pay | Admitting: Psychiatry

## 2019-03-14 DIAGNOSIS — M25561 Pain in right knee: Secondary | ICD-10-CM | POA: Diagnosis not present

## 2019-03-14 DIAGNOSIS — M2391 Unspecified internal derangement of right knee: Secondary | ICD-10-CM | POA: Diagnosis not present

## 2019-03-16 DIAGNOSIS — Z23 Encounter for immunization: Secondary | ICD-10-CM | POA: Diagnosis not present

## 2019-03-18 ENCOUNTER — Other Ambulatory Visit: Payer: Self-pay | Admitting: Psychiatry

## 2019-03-18 ENCOUNTER — Telehealth: Payer: Self-pay | Admitting: Psychiatry

## 2019-03-18 DIAGNOSIS — F900 Attention-deficit hyperactivity disorder, predominantly inattentive type: Secondary | ICD-10-CM

## 2019-03-18 MED ORDER — LISDEXAMFETAMINE DIMESYLATE 50 MG PO CAPS: 50.0000 mg | ORAL_CAPSULE | Freq: Every day | ORAL | 0 refills | Status: DC

## 2019-03-18 NOTE — Telephone Encounter (Signed)
Pt would like a refill on her Vyvanse 50mg . Please send to [facility] [address], [place].

## 2019-03-18 NOTE — Telephone Encounter (Signed)
Done

## 2019-03-23 DIAGNOSIS — Z01818 Encounter for other preprocedural examination: Secondary | ICD-10-CM | POA: Diagnosis not present

## 2019-03-29 ENCOUNTER — Ambulatory Visit (INDEPENDENT_AMBULATORY_CARE_PROVIDER_SITE_OTHER): Payer: BC Managed Care – PPO | Admitting: Psychiatry

## 2019-03-29 ENCOUNTER — Other Ambulatory Visit: Payer: Self-pay

## 2019-03-29 ENCOUNTER — Encounter: Payer: Self-pay | Admitting: Psychiatry

## 2019-03-29 VITALS — BP 122/80 | HR 84

## 2019-03-29 DIAGNOSIS — F3342 Major depressive disorder, recurrent, in full remission: Secondary | ICD-10-CM | POA: Diagnosis not present

## 2019-03-29 DIAGNOSIS — F900 Attention-deficit hyperactivity disorder, predominantly inattentive type: Secondary | ICD-10-CM | POA: Diagnosis not present

## 2019-03-29 MED ORDER — LISDEXAMFETAMINE DIMESYLATE 50 MG PO CAPS: 50.0000 mg | ORAL_CAPSULE | Freq: Every day | ORAL | 0 refills | Status: DC

## 2019-03-29 NOTE — Progress Notes (Signed)
[name] [id number] 1960/07/21 58 y.o.  Subjective:   Patient ID:  [name] is a 58 y.o. (DOB [birth year]) female.  Chief Complaint:  Chief Complaint  Patient presents with  . Follow-up    Medication Management  . ADD    Medication Management  . Anxiety    Medication Management  . Depression    Medication Management    Anxiety Patient reports no confusion, decreased concentration, nervous/anxious behavior or suicidal ideas.    Depression        Associated symptoms include no decreased concentration and no suicidal ideas.  Past medical history includes anxiety.    [name] presents to the office today for follow-up of depression and ADD.  Last seen August 23, 2018.  She had reduced the Abilify to 5 mg and was doing well.  She wanted to try to taper off of that medication.  Last visit June 2020.  Reduced abilify to 2 mg daily without any problems.   wants to reduce it again.  No worse depression.  Other stressors have occurred related to the marriage.  Anxiety is still manageable.  Sleep 7 hours.  Sometimes goes to bed to late.  Work from home and a lot. Work is crazy.  Hit with with ransomware at the office.  Hard to recover. 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  Mother living with her until 02/05/23.  Oldest sister passed away and she was a caregiver.  Adjusting but been ok.  Sleep OK usually.  Diazepam mainly used for sleep and it helps with Flexeril.  Wants to eventually try reducing Wellbutrin to see if tremor is better.  Tremor is better with less Abilify.  Pleased with Vyvanse.  Planning knee surgery.  Past Psychiatric Medication Trials: Temazepam, clonazepam, Ambien no response, Flexeril, Abilify 10, diazepam, Vyvanse, bupropion 450 tremor, buspirone 30 twice daily [place], lithium, lamotrigine, venlafaxine hypomania, fluoxetine, Adderall   Review of Systems:  Review of Systems  Musculoskeletal: Positive for arthralgias.  Neurological:  Positive for tremors. Negative for weakness.  Psychiatric/Behavioral: Positive for depression. Negative for agitation, behavioral problems, confusion, decreased concentration, dysphoric mood, hallucinations, self-injury, sleep disturbance and suicidal ideas. The patient is not nervous/anxious and is not hyperactive.     Medications: I have reviewed the patient's current medications.  Current Outpatient Medications  Medication Sig Dispense Refill  . ARIPiprazole (ABILIFY) 2 MG tablet TAKE 1 TABLET BY MOUTH EVERY DAY 90 tablet 1  . buPROPion (WELLBUTRIN XL) 300 MG 24 hr tablet Take 1 tablet (300 mg total) by mouth daily. 90 tablet 0  . Cholecalciferol (VITAMIN D3) 5000 units CAPS Take by mouth.    . cyclobenzaprine (FLEXERIL) 10 MG tablet TAKE 1 TABLET BY MOUTH THREE TIMES A DAY AS NEEDED FOR MUSCLE SPASMS 90 tablet 2  . diazepam (VALIUM) 5 MG tablet Take 1-2 tablets (5-10 mg total) by mouth at bedtime as needed. for sleep 60 tablet 1  . [START ON 05/13/2019] lisdexamfetamine (VYVANSE) 50 MG capsule Take 1 capsule (50 mg total) by mouth daily. 30 capsule 0  . [START ON 04/15/2019] lisdexamfetamine (VYVANSE) 50 MG capsule Take 1 capsule (50 mg total) by mouth daily. 30 capsule 0  . lisdexamfetamine (VYVANSE) 50 MG capsule Take 1 capsule (50 mg total) by mouth daily. 30 capsule 0   No current facility-administered medications for this visit.     Medication Side Effects: None  Allergies:  Allergies  Allergen Reactions  . Morphine And Related Itching  Past Medical History:  Diagnosis Date  . Anxiety   . Asthma    as a child, early adult...none now  . BRCA negative 06/2013   BRCA I & II negative  . Breast cancer ([place]) 10/28/12   right breast  . Colonic mass 07/28/2011   ascending colon and found a polyp 2.5 cm adenoma - hemicoloectomy  . Depression   . GERD (gastroesophageal reflux disease)    occas  at  night....twice a  month--otc meds  . Recurrent upper respiratory infection  (URI)    started 1/28.....getting better  . S/P chemotherapy, time since greater than 12 weeks     Family History  Problem Relation Age of Onset  . Heart disease Father   . Cancer Paternal Aunt        unknown form of cancer  . Lung cancer Maternal Grandmother   . Testicular cancer Cousin        paternal cousin; died in 57s  . Pancreatic cancer Cousin 23  . Colon cancer Other        paternal grandmother's sister  . Cancer Other        MGM's sister    Social History   Socioeconomic History  . Marital status: Married    Spouse name: [name]  . Number of children: 2  . Years of education: Not on file  . Highest education level: Not on file  Occupational History    Employer: [employer]  . Financial resource strain: Not on file  . Food insecurity    Worry: Not on file    Inability: Not on file  . Transportation needs    Medical: Not on file    Non-medical: Not on file  Tobacco Use  . Smoking status: Never Smoker  . Smokeless tobacco: Never Used  Substance and Sexual Activity  . Alcohol use: Yes    Comment: wine occasionally  . Drug use: No  . Sexual activity: Yes    Birth control/protection: Surgical  Lifestyle  . Physical activity    Days per week: Not on file    Minutes per session: Not on file  . Stress: Not on file  Relationships  . Social Herbalist on phone: Not on file    Gets together: Not on file    Attends religious service: Not on file    Active member of club or organization: Not on file    Attends meetings of clubs or organizations: Not on file    Relationship status: Not on file  . Intimate partner violence    Fear of current or ex partner: Not on file    Emotionally abused: Not on file    Physically abused: Not on file    Forced sexual activity: Not on file  Other Topics Concern  . Not on file  Social History Narrative  . Not on file    Past Medical History, Surgical history, Social history, and Family history were  reviewed and updated as appropriate.   Please see review of systems for further details on the patient's review from today.   Objective:   Physical Exam:  BP 122/80   Pulse 84   Physical Exam Constitutional:      General: She is not in acute distress.    Appearance: She is well-developed.  Musculoskeletal:        General: No deformity.  Neurological:     Mental Status: She is alert and oriented to person, place,  and time.     Motor: No tremor.     Coordination: Coordination normal.     Gait: Gait normal.  Psychiatric:        Attention and Perception: She is attentive.        Mood and Affect: Mood is not anxious or depressed. Affect is not labile, blunt, angry or inappropriate.        Speech: Speech normal.        Behavior: Behavior normal.        Thought Content: Thought content normal. Thought content does not include homicidal or suicidal ideation. Thought content does not include homicidal or suicidal plan.        Judgment: Judgment normal.     Comments: Insight intact. No auditory or visual hallucinations. No delusions.      Lab Review:     Component Value Date/Time   NA 142 02/24/2019 0939   NA 139 02/09/2017 0911   K 4.4 02/24/2019 0939   K 4.0 02/09/2017 0911   CL 105 02/24/2019 0939   CL 102 11/03/2012 0821   CO2 27 02/24/2019 0939   CO2 26 02/09/2017 0911   GLUCOSE 87 02/24/2019 0939   GLUCOSE 104 02/09/2017 0911   GLUCOSE 93 11/03/2012 0821   BUN 21 (H) 02/24/2019 0939   BUN 15.5 02/09/2017 0911   CREATININE 0.91 02/24/2019 0939   CREATININE 0.9 02/09/2017 0911   CALCIUM 10.1 02/24/2019 0939   CALCIUM 9.9 02/09/2017 0911   PROT 7.5 02/24/2019 0939   PROT 7.4 02/09/2017 0911   ALBUMIN 4.6 02/24/2019 0939   ALBUMIN 4.1 02/09/2017 0911   AST 20 02/24/2019 0939   AST 17 02/09/2017 0911   ALT 16 02/24/2019 0939   ALT 14 02/09/2017 0911   ALKPHOS 45 02/24/2019 0939   ALKPHOS 60 02/09/2017 0911   BILITOT 0.3 02/24/2019 0939   BILITOT 0.45 02/09/2017  0911   GFRNONAA >60 02/24/2019 0939   GFRAA >60 02/24/2019 0939       Component Value Date/Time   WBC 6.7 02/24/2019 0939   RBC 4.60 02/24/2019 0939   HGB 13.2 02/24/2019 0939   HGB 12.6 02/09/2017 0911   HCT 40.4 02/24/2019 0939   HCT 38.8 02/09/2017 0911   PLT 324 02/24/2019 0939   PLT 295 02/09/2017 0911   MCV 87.8 02/24/2019 0939   MCV 87.4 02/09/2017 0911   MCH 28.7 02/24/2019 0939   MCHC 32.7 02/24/2019 0939   RDW 13.5 02/24/2019 0939   RDW 14.2 02/09/2017 0911   LYMPHSABS 2.0 02/24/2019 0939   LYMPHSABS 1.6 02/09/2017 0911   MONOABS 0.6 02/24/2019 0939   MONOABS 0.5 02/09/2017 0911   EOSABS 0.3 02/24/2019 0939   EOSABS 0.1 02/09/2017 0911   BASOSABS 0.1 02/24/2019 0939   BASOSABS 0.1 02/09/2017 0911    No results found for: POCLITH, LITHIUM   No results found for: PHENYTOIN, PHENOBARB, VALPROATE, CBMZ   .res Assessment: Plan:    Attention deficit hyperactivity disorder (ADHD), predominantly inattentive type  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  Part of this is progressing and resolving the end of her marriage.  Her depression is better controlled ended her anxiety is also better controlled.  She wonders about reducing some of the medication.  She is tolerated the reduction Wellbutrin fine and her tremor is markedly better with a lower dose.  She did fine with the reduction in Abilify to 2 mg  daily.  Discussed potential metabolic side effects associated with atypical antipsychotics, as well as potential risk for movement side effects. Advised pt to contact office if movement side effects occur.     abilify to 2 mg for 2 - 4 weeks and stop it.  Disc risk of relapse and call if a problem.  Discussed potential benefits, risks, and side effects of stimulants with patient to include increased heart rate, palpitations, insomnia, increased anxiety, increased irritability, or decreased  appetite.  Instructed patient to contact office if experiencing any significant tolerability issues.  She is satisfied with the Vyvanse.  Disc ADD.  She clearly got it from father.  We discussed the short-term risks associated with benzodiazepines including sedation and increased fall risk among others.  Discussed long-term side effect risk including dependence, potential withdrawal symptoms, and the potential eventual dose-related risk of dementia.  She agrees to the plan  Follow-up 3 months and consider reducing the Wellbutrin per her request.  Disc the typical dosing range in Wellbutrin.    Lynder Parents MD, DFAPA   Please see After Visit Summary for patient specific instructions.  Future Appointments  Date Time Provider [place]  03/08/2020  8:30 AM [facility] LAB 1 [facility] None  03/08/2020  9:00 AM Magrinat, Virgie Dad, MD [facility] None  03/08/2020 10:00 AM [facility] INFUSION [facility] None    No orders of the defined types were placed in this encounter.     -------------------------------

## 2019-04-01 DIAGNOSIS — Z20828 Contact with and (suspected) exposure to other viral communicable diseases: Secondary | ICD-10-CM | POA: Diagnosis not present

## 2019-04-01 DIAGNOSIS — F4322 Adjustment disorder with anxiety: Secondary | ICD-10-CM | POA: Diagnosis not present

## 2019-04-08 DIAGNOSIS — F4322 Adjustment disorder with anxiety: Secondary | ICD-10-CM | POA: Diagnosis not present

## 2019-04-11 DIAGNOSIS — S83241A Other tear of medial meniscus, current injury, right knee, initial encounter: Secondary | ICD-10-CM | POA: Diagnosis not present

## 2019-04-11 DIAGNOSIS — Y999 Unspecified external cause status: Secondary | ICD-10-CM | POA: Diagnosis not present

## 2019-04-11 DIAGNOSIS — M23331 Other meniscus derangements, other medial meniscus, right knee: Secondary | ICD-10-CM | POA: Diagnosis not present

## 2019-04-11 DIAGNOSIS — M94261 Chondromalacia, right knee: Secondary | ICD-10-CM | POA: Diagnosis not present

## 2019-04-11 DIAGNOSIS — X58XXXA Exposure to other specified factors, initial encounter: Secondary | ICD-10-CM | POA: Diagnosis not present

## 2019-04-15 DIAGNOSIS — F4322 Adjustment disorder with anxiety: Secondary | ICD-10-CM | POA: Diagnosis not present

## 2019-04-22 DIAGNOSIS — F4322 Adjustment disorder with anxiety: Secondary | ICD-10-CM | POA: Diagnosis not present

## 2019-04-29 DIAGNOSIS — F4322 Adjustment disorder with anxiety: Secondary | ICD-10-CM | POA: Diagnosis not present

## 2019-04-29 DIAGNOSIS — M25561 Pain in right knee: Secondary | ICD-10-CM | POA: Diagnosis not present

## 2019-05-06 DIAGNOSIS — F4322 Adjustment disorder with anxiety: Secondary | ICD-10-CM | POA: Diagnosis not present

## 2019-05-10 DIAGNOSIS — M25561 Pain in right knee: Secondary | ICD-10-CM | POA: Diagnosis not present

## 2019-05-22 DIAGNOSIS — Z20828 Contact with and (suspected) exposure to other viral communicable diseases: Secondary | ICD-10-CM | POA: Diagnosis not present

## 2019-06-06 DIAGNOSIS — M25561 Pain in right knee: Secondary | ICD-10-CM | POA: Diagnosis not present

## 2019-06-07 ENCOUNTER — Other Ambulatory Visit: Payer: Self-pay | Admitting: Psychiatry

## 2019-06-07 NOTE — Telephone Encounter (Signed)
Check refill

## 2019-06-13 DIAGNOSIS — M25561 Pain in right knee: Secondary | ICD-10-CM | POA: Diagnosis not present

## 2019-06-30 ENCOUNTER — Encounter: Payer: Self-pay | Admitting: Psychiatry

## 2019-06-30 ENCOUNTER — Other Ambulatory Visit: Payer: Self-pay

## 2019-06-30 ENCOUNTER — Ambulatory Visit (INDEPENDENT_AMBULATORY_CARE_PROVIDER_SITE_OTHER): Payer: BC Managed Care – PPO | Admitting: Psychiatry

## 2019-06-30 DIAGNOSIS — F411 Generalized anxiety disorder: Secondary | ICD-10-CM | POA: Diagnosis not present

## 2019-06-30 DIAGNOSIS — F3342 Major depressive disorder, recurrent, in full remission: Secondary | ICD-10-CM

## 2019-06-30 DIAGNOSIS — F900 Attention-deficit hyperactivity disorder, predominantly inattentive type: Secondary | ICD-10-CM

## 2019-06-30 MED ORDER — BUPROPION HCL ER (XL) 300 MG PO TB24: 300.0000 mg | ORAL_TABLET | Freq: Every day | ORAL | 1 refills | Status: DC

## 2019-06-30 NOTE — Progress Notes (Signed)
[name] [id number] 09-15-1960 59 y.o.  Subjective:   Patient ID:  [name] is a 59 y.o. (DOB Nov 19, 1960) female.  Chief Complaint:  Chief Complaint  Patient presents with  . Follow-up    Medication Management  . ADHD    Medication Management  . Depression    Medication Management    Anxiety Patient reports no confusion, decreased concentration, nervous/anxious behavior or suicidal ideas.    Depression        Associated symptoms include no decreased concentration and no suicidal ideas.  Past medical history includes anxiety.    [name] presents to the office today for follow-up of depression and ADD.  Last seen August 23, 2018.  She had reduced the Abilify to 5 mg and was doing well.  She wanted to try to taper off of that medication.   visit June 2020.  Reduced abilify to 2 mg daily without any problems.   Last visit October 2020.  She was still doing well and she wanted to try discontinuing the Abilify which was reasonable so it was stopped.  Healthy.  No problem off the Abilify.  Depression is under control.  Very stressful last 2 weeks which created some anxiety.  Still working long hours.  Stopped Vyvanse bc didn't feel she needed it any longer about 3 weeks ago.  Tremor resolved.   No worse depression.  Other stressors have occurred related to the marriage.  Anxiety is still manageable.  Sleep 7 hours until worse last 2 weeks with work demands.  Sometimes goes to bed to late.  Work from home and a lot. 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  Mother living with her until July.    Sleep OK usually.  Diazepam mainly used for sleep and it helps with Flexeril.  Wants to eventually try reducing Wellbutrin to see if tremor is better.   Planning knee surgery.  Dating and feels better.  Past Psychiatric Medication Trials: Temazepam, clonazepam, Ambien no response, Flexeril, Abilify 10, diazepam, Vyvanse, bupropion 450 tremor, buspirone 30 twice  daily [place], lithium, lamotrigine, venlafaxine hypomania, fluoxetine,  Adderall, Vyvanse   Review of Systems:  Review of Systems  Musculoskeletal: Positive for arthralgias.  Neurological: Negative for tremors and weakness.  Psychiatric/Behavioral: Positive for depression and sleep disturbance. Negative for agitation, behavioral problems, confusion, decreased concentration, dysphoric mood, hallucinations, self-injury and suicidal ideas. The patient is not nervous/anxious and is not hyperactive.     Medications: I have reviewed the patient's current medications.  Current Outpatient Medications  Medication Sig Dispense Refill  . b complex vitamins tablet Take 1 tablet by mouth daily.    Marland Kitchen buPROPion (WELLBUTRIN XL) 300 MG 24 hr tablet Take 1 tablet (300 mg total) by mouth daily. 90 tablet 1  . calcium carbonate (OS-CAL) 1250 (500 Ca) MG chewable tablet Chew 1 tablet by mouth daily.    . Cholecalciferol (VITAMIN D3) 5000 units CAPS Take by mouth.    . Multiple Vitamin (MULTIVITAMIN) tablet Take 1 tablet by mouth daily.     No current facility-administered medications for this visit.    Medication Side Effects: None  Allergies:  Allergies  Allergen Reactions  . Morphine And Related Itching    Past Medical History:  Diagnosis Date  . Anxiety   . Asthma    as a child, early adult...none now  . BRCA negative 06/2013   BRCA I & II negative  . Breast cancer ([place]) 10/28/12   right breast  .  Colonic mass 07/28/2011   ascending colon and found a polyp 2.5 cm adenoma - hemicoloectomy  . Depression   . GERD (gastroesophageal reflux disease)    occas  at  night....twice a  month--otc meds  . Recurrent upper respiratory infection (URI)    started 1/28.....getting better  . S/P chemotherapy, time since greater than 12 weeks     Family History  Problem Relation Age of Onset  . Heart disease Father   . Cancer Paternal Aunt        unknown form of cancer  . Lung cancer Maternal  Grandmother   . Testicular cancer Cousin        paternal cousin; died in 34s  . Pancreatic cancer Cousin 70  . Colon cancer Other        paternal grandmother's sister  . Cancer Other        MGM's sister    Social History   Socioeconomic History  . Marital status: Married    Spouse name: [name]  . Number of children: 2  . Years of education: Not on file  . Highest education level: Not on file  Occupational History    Employer: [employer]  Tobacco Use  . Smoking status: Never Smoker  . Smokeless tobacco: Never Used  Substance and Sexual Activity  . Alcohol use: Yes    Comment: wine occasionally  . Drug use: No  . Sexual activity: Yes    Birth control/protection: Surgical  Other Topics Concern  . Not on file  Social History Narrative  . Not on file   Social Determinants of Health   Financial Resource Strain:   . Difficulty of Paying Living Expenses: Not on file  Food Insecurity:   . Worried About Charity fundraiser in the Last Year: Not on file  . Ran Out of Food in the Last Year: Not on file  Transportation Needs:   . Lack of Transportation (Medical): Not on file  . Lack of Transportation (Non-Medical): Not on file  Physical Activity:   . Days of Exercise per Week: Not on file  . Minutes of Exercise per Session: Not on file  Stress:   . Feeling of Stress : Not on file  Social Connections:   . Frequency of Communication with Friends and Family: Not on file  . Frequency of Social Gatherings with Friends and Family: Not on file  . Attends Religious Services: Not on file  . Active Member of Clubs or Organizations: Not on file  . Attends Archivist Meetings: Not on file  . Marital Status: Not on file  Intimate Partner Violence:   . Fear of Current or Ex-Partner: Not on file  . Emotionally Abused: Not on file  . Physically Abused: Not on file  . Sexually Abused: Not on file    Past Medical History, Surgical history, Social history, and Family history were  reviewed and updated as appropriate.   Please see review of systems for further details on the patient's review from today.   Objective:   Physical Exam:  There were no vitals taken for this visit.  Physical Exam Constitutional:      General: She is not in acute distress.    Appearance: She is well-developed.  Musculoskeletal:        General: No deformity.  Neurological:     Mental Status: She is alert and oriented to person, place, and time.     Motor: No tremor.     Coordination: Coordination  normal.     Gait: Gait normal.  Psychiatric:        Attention and Perception: She is attentive.        Mood and Affect: Mood is not anxious or depressed. Affect is not labile, blunt, angry or inappropriate.        Speech: Speech normal.        Behavior: Behavior normal.        Thought Content: Thought content normal. Thought content does not include homicidal or suicidal ideation. Thought content does not include homicidal or suicidal plan.        Cognition and Memory: Cognition normal.        Judgment: Judgment normal.     Comments: Insight intact. No auditory or visual hallucinations. No delusions.      Lab Review:     Component Value Date/Time   NA 142 02/24/2019 0939   NA 139 02/09/2017 0911   K 4.4 02/24/2019 0939   K 4.0 02/09/2017 0911   CL 105 02/24/2019 0939   CL 102 11/03/2012 0821   CO2 27 02/24/2019 0939   CO2 26 02/09/2017 0911   GLUCOSE 87 02/24/2019 0939   GLUCOSE 104 02/09/2017 0911   GLUCOSE 93 11/03/2012 0821   BUN 21 (H) 02/24/2019 0939   BUN 15.5 02/09/2017 0911   CREATININE 0.91 02/24/2019 0939   CREATININE 0.9 02/09/2017 0911   CALCIUM 10.1 02/24/2019 0939   CALCIUM 9.9 02/09/2017 0911   PROT 7.5 02/24/2019 0939   PROT 7.4 02/09/2017 0911   ALBUMIN 4.6 02/24/2019 0939   ALBUMIN 4.1 02/09/2017 0911   AST 20 02/24/2019 0939   AST 17 02/09/2017 0911   ALT 16 02/24/2019 0939   ALT 14 02/09/2017 0911   ALKPHOS 45 02/24/2019 0939   ALKPHOS 60  02/09/2017 0911   BILITOT 0.3 02/24/2019 0939   BILITOT 0.45 02/09/2017 0911   GFRNONAA >60 02/24/2019 0939   GFRAA >60 02/24/2019 0939       Component Value Date/Time   WBC 6.7 02/24/2019 0939   RBC 4.60 02/24/2019 0939   HGB 13.2 02/24/2019 0939   HGB 12.6 02/09/2017 0911   HCT 40.4 02/24/2019 0939   HCT 38.8 02/09/2017 0911   PLT 324 02/24/2019 0939   PLT 295 02/09/2017 0911   MCV 87.8 02/24/2019 0939   MCV 87.4 02/09/2017 0911   MCH 28.7 02/24/2019 0939   MCHC 32.7 02/24/2019 0939   RDW 13.5 02/24/2019 0939   RDW 14.2 02/09/2017 0911   LYMPHSABS 2.0 02/24/2019 0939   LYMPHSABS 1.6 02/09/2017 0911   MONOABS 0.6 02/24/2019 0939   MONOABS 0.5 02/09/2017 0911   EOSABS 0.3 02/24/2019 0939   EOSABS 0.1 02/09/2017 0911   BASOSABS 0.1 02/24/2019 0939   BASOSABS 0.1 02/09/2017 0911    No results found for: POCLITH, LITHIUM   No results found for: PHENYTOIN, PHENOBARB, VALPROATE, CBMZ   .res Assessment: Plan:    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  Mostly bc is progressing and resolving the end of her marriage.  Her depression is better controlled ended her anxiety is also better controlled.  She wondered about reducing some of the medication.  She is tolerated the reduction Wellbutrin fine and her tremor is markedly better with a lower dose.  She  did fine with offoff the Abilify  2 mg daily.  She has been off Vyvanse for 3 weeks and so far feels like she is okay off that.  The tremor is gone when she stopped the Vyvanse once her work schedule becomes more normalized.  She will let us know.  No further med changes today since she has recently stopped Abilify and Vyvanse.  She agrees to the plan  Follow-up 59month and consider  reducing the Wellbutrin per her request.  CLynder ParentsMD, DFAPA   Please see After Visit Summary for patient specific instructions.  Future Appointments  Date Time Provider DElk Garden 12/29/2019  4:15 PM Cottle, CBilley Co, MD [facility] None  03/08/2020  8:30 AM [facility] LAB 1 [facility] None  03/08/2020  9:00 AM Magrinat, GVirgie Dad MD [facility] None  03/08/2020 10:00 AM [facility] INFUSION [facility] None    No orders of the defined types were placed in this encounter.     -------------------------------

## 2019-07-07 ENCOUNTER — Ambulatory Visit: Payer: BC Managed Care – PPO | Attending: Internal Medicine

## 2019-07-07 DIAGNOSIS — Z23 Encounter for immunization: Secondary | ICD-10-CM

## 2019-07-07 NOTE — Progress Notes (Signed)
   Covid-19 Vaccination Clinic  Name:  [name]    MRN: [id number] DOB: [birth year]  07/07/2019  [name] was observed post Covid-19 immunization for 15 minutes without incidence. She was provided with Vaccine Information Sheet and instruction to access the V-Safe system.   [name] was instructed to call 911 with any severe reactions post vaccine: Marland Kitchen Difficulty breathing  . Swelling of your face and throat  . A fast heartbeat  . A bad rash all over your body  . Dizziness and weakness    Immunizations Administered    Name Date Dose VIS Date Route   Pfizer COVID-19 Vaccine 07/07/2019  6:10 PM 0.3 mL 05/27/2019 Intramuscular   Manufacturer: [place]   Lot: [id number]   [place]: [id number]

## 2019-07-20 ENCOUNTER — Encounter: Payer: Self-pay | Admitting: Oncology

## 2019-07-22 ENCOUNTER — Telehealth: Payer: Self-pay | Admitting: Psychiatry

## 2019-07-22 ENCOUNTER — Other Ambulatory Visit: Payer: Self-pay | Admitting: Psychiatry

## 2019-07-22 MED ORDER — DIAZEPAM 5 MG PO TABS: 5.0000 mg | ORAL_TABLET | Freq: Every evening | ORAL | 0 refills | Status: DC | PRN

## 2019-07-22 NOTE — Telephone Encounter (Signed)
Pt. Made aware.

## 2019-07-22 NOTE — Telephone Encounter (Signed)
I need her chart to answer this question.

## 2019-07-22 NOTE — Telephone Encounter (Signed)
Pt would like something to help her sleep. Please call in at [facility] on [address].

## 2019-07-22 NOTE — Telephone Encounter (Signed)
Last effective sleep med that she took was diazepam 5 mg tablets.  We will send in that prescription to her pharmacy.

## 2019-07-28 ENCOUNTER — Ambulatory Visit: Payer: BC Managed Care – PPO | Attending: Internal Medicine

## 2019-07-28 DIAGNOSIS — Z23 Encounter for immunization: Secondary | ICD-10-CM

## 2019-07-28 NOTE — Progress Notes (Signed)
   Covid-19 Vaccination Clinic  Name:  [name]    MRN: [id number] DOB: 1960-11-12  07/28/2019  [name] was observed post Covid-19 immunization for 15 minutes without incidence. She was provided with Vaccine Information Sheet and instruction to access the V-Safe system.   [name] was instructed to call 911 with any severe reactions post vaccine: Marland Kitchen Difficulty breathing  . Swelling of your face and throat  . A fast heartbeat  . A bad rash all over your body  . Dizziness and weakness    Immunizations Administered    Name Date Dose VIS Date Route   Pfizer COVID-19 Vaccine 07/28/2019  5:49 PM 0.3 mL 05/27/2019 Intramuscular   Manufacturer: [place]   Lot: [id number]   [place]: [id number]

## 2019-07-29 ENCOUNTER — Encounter: Payer: Self-pay | Admitting: Oncology

## 2019-07-29 DIAGNOSIS — M8588 Other specified disorders of bone density and structure, other site: Secondary | ICD-10-CM | POA: Diagnosis not present

## 2019-07-29 DIAGNOSIS — M810 Age-related osteoporosis without current pathological fracture: Secondary | ICD-10-CM | POA: Diagnosis not present

## 2019-08-05 ENCOUNTER — Other Ambulatory Visit: Payer: Self-pay | Admitting: Oncology

## 2019-08-05 NOTE — Progress Notes (Signed)
Bone density scan at [facility] 07/29/2019 showed a T score of -3.0.  There was some increase in the lumbar spine density.

## 2019-08-30 DIAGNOSIS — R400 Somnolence: Secondary | ICD-10-CM | POA: Diagnosis not present

## 2019-08-30 DIAGNOSIS — M818 Other osteoporosis without current pathological fracture: Secondary | ICD-10-CM | POA: Diagnosis not present

## 2019-08-30 DIAGNOSIS — R0683 Snoring: Secondary | ICD-10-CM | POA: Diagnosis not present

## 2019-08-30 DIAGNOSIS — E7800 Pure hypercholesterolemia, unspecified: Secondary | ICD-10-CM | POA: Diagnosis not present

## 2019-08-30 DIAGNOSIS — Z0000 Encounter for general adult medical examination without abnormal findings: Secondary | ICD-10-CM | POA: Diagnosis not present

## 2019-10-12 DIAGNOSIS — R0683 Snoring: Secondary | ICD-10-CM | POA: Diagnosis not present

## 2019-10-12 DIAGNOSIS — G4719 Other hypersomnia: Secondary | ICD-10-CM | POA: Diagnosis not present

## 2019-10-12 DIAGNOSIS — G478 Other sleep disorders: Secondary | ICD-10-CM | POA: Diagnosis not present

## 2019-10-16 ENCOUNTER — Encounter: Payer: Self-pay | Admitting: Oncology

## 2019-10-17 NOTE — Progress Notes (Signed)
ID: [name] OB: 08-Oct-1960  MR#: [id number]  UKG#:[id number]  Patient Care Team: Elizabeth Ada, MD as PCP - General (Family Medicine) Elizabeth Cancel, MD as Consulting Physician (Orthopedic Surgery) Magrinat, Virgie Dad, MD as Consulting Physician (Oncology) Cottle, Billey Co., MD as Consulting Physician (Psychiatry) OTHER MD:   CHIEF COMPLAINT: Estrogen receptor positive breast cancer  CURRENT TREATMENT: zoledronate   INTERVAL HISTORY: [name] returns today for urgent evaluation of her estrogen receptor positive breast cancer.  Her most recent bilateral diagnostic mammography with tomography at [facility] on 02/11/2019 showing: breast density category D; no evidence of malignancy in either breast.  She also underwent bilateral breast MRI on 02/22/2019, which showed: breast composition D; no MRI evidence of breast malignancy.  Her most recent bone density was stable to slightly improved in her osteoporosis which was completed at [facility] on 07/29/2019.  She continues on Zoledronate yearly and will receive this again in September.  REVIEW OF SYSTEMS: [name] was doing a breast exam while lying down, and she noticed a difference between her two breasts.  She wants to make sure she isn't imagining things. She feels an area of concern in her left lower breast.  She notes her breast cancer was originally flat, and she was unable to palpate it.  She is concerned about this.  This began on Saturday, 10/15/2019.  Otherwise [name] is doing well and a detailed ROS was non contributory.     PAST MEDICAL HISTORY: Past Medical History:  Diagnosis Date  . Anxiety   . Asthma    as a child, early adult...none now  . BRCA negative 06/2013   BRCA I & II negative  . Breast cancer ([place]) 10/28/12   right breast  . Colonic mass 07/28/2011   ascending colon and found a polyp 2.5 cm adenoma - hemicoloectomy  . Depression   . GERD (gastroesophageal reflux disease)    occas  at  night....twice a  month--otc  meds  . Recurrent upper respiratory infection (URI)    started 1/28.....getting better  . S/P chemotherapy, time since greater than 12 weeks      PAST SURGICAL HISTORY: Past Surgical History:  Procedure Laterality Date  . APPENDECTOMY    . BREAST BIOPSY Right 10/28/12   Invasive mammary ca, ER/PR=+, Her2 Neu-  . ENDOMETRIAL ABLATION  2006   HTA  . HEMICOLECTOMY  07/28/11  . TUBAL LIGATION  2006    FAMILY HISTORY Family History  Problem Relation Age of Onset  . Heart disease Father   . Cancer Paternal Aunt        unknown form of cancer  . Lung cancer Maternal Grandmother   . Testicular cancer Cousin        paternal cousin; died in 81s  . Pancreatic cancer Cousin 64  . Colon cancer Other        paternal grandmother's sister  . Cancer Other        MGM's sister   the patient's father died from congestive heart failure at the age of 80. The patient's mother is still living, in her early 8s. The patient has one brother, 4 sisters. There is no history of breast or ovarian cancer in the family to the patient's knowledge.   GYNECOLOGIC HISTORY:  Menarche age 28, first live birth age 33. The patient's less. It was September 2013 and the one before that was March 2013. She is not taking hormone replacement. She did take oral contraceptives for several years intermittently in the past.  There were no complications.    SOCIAL HISTORY:  (Updated September 2020 Tesla is originally from [place]. 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 She is not a counselor herself, but her former husband, [name] (goes by "[name]") works as a Social worker for the [employer] as well as having his own clinic in [place]. He is originally from [place].  They divorced over 2019.  The patient's children are [name], who lives in [place] and has a Masters in counseling; she has 1 daughter; and [name], who is working in Engineer, technical sales and  has 2 daughters, the younger one [place] 21 years old being worked up for possible mild autism, the older one 59 years old and he shares custody of her  ADVANCED DIRECTIVES:   HEALTH MAINTENANCE: Social History   Tobacco Use  . Smoking status: Never Smoker  . Smokeless tobacco: Never Used  Substance Use Topics  . Alcohol use: Yes    Comment: wine occasionally  . Drug use: No     Colonoscopy: 06/02/2011 / [facility]  PAP:  Bone density: 05/19/2017 [facility] showed a T score of -3.1 osteoporosis.  Lipid panel:   Allergies  Allergen Reactions  . Morphine And Related Itching    Vitals:   10/18/19 1346  BP: 134/62  Pulse: 70  Resp: 18  Temp: 98.7 F (37.1 C)  SpO2: 98%     Body mass index is 24.13 kg/m.    ECOG FS: 1 Filed Weights   10/18/19 1346  Weight: 136 lb 3.2 oz (61.8 kg)   OBJECTIVE: Middle-aged white woman who appears well GENERAL: Patient is a well appearing female in no acute distress HEENT:  Sclerae anicteric.  Mask in place.  Neck is supple.  NODES:  No cervical, supraclavicular, or axillary lymphadenopathy palpated.  BREAST EXAM:  Right breast s/p lumpectomy and radiation, no sign of local recurrence.  Left breast, unable to detect abnormality in left breast at 6 oclock.   LUNGS:  Clear to auscultation bilaterally.  No wheezes or rhonchi. HEART:  Regular rate and rhythm. No murmur appreciated. ABDOMEN:  Soft, nontender.  Positive, normoactive bowel sounds. No organomegaly palpated. MSK:  No focal spinal tenderness to palpation. Full range of motion bilaterally in the upper extremities. EXTREMITIES:  No peripheral edema.   SKIN:  Clear with no obvious rashes or skin changes. No nail dyscrasia. NEURO:  Nonfocal. Well oriented.  Appropriate affect.    LAB RESULTS:   Lab Results  Component Value Date   WBC 6.7 02/24/2019   NEUTROABS 3.9 02/24/2019   HGB 13.2 02/24/2019   HCT 40.4 02/24/2019   MCV 87.8 02/24/2019   PLT 324 02/24/2019      Chemistry       Component Value Date/Time   NA 142 02/24/2019 0939   NA 139 02/09/2017 0911   K 4.4 02/24/2019 0939   K 4.0 02/09/2017 0911   CL 105 02/24/2019 0939   CL 102 11/03/2012 0821   CO2 27 02/24/2019 0939   CO2 26 02/09/2017 0911   BUN 21 (H) 02/24/2019 0939   BUN 15.5 02/09/2017 0911   CREATININE 0.91 02/24/2019 0939   CREATININE 0.9 02/09/2017 0911      Component Value Date/Time   CALCIUM 10.1 02/24/2019 0939   CALCIUM 9.9 02/09/2017 0911   ALKPHOS 45 02/24/2019 0939   ALKPHOS 60 02/09/2017 0911   AST 20 02/24/2019 0939   AST 17 02/09/2017 0911   ALT 16 02/24/2019  0939   ALT 14 02/09/2017 0911   BILITOT 0.3 02/24/2019 0939   BILITOT 0.45 02/09/2017 0911       STUDIES: No results found.  ASSESSMENT: 59 y.o. [place] woman status post right breast lower inner quadrant biopsy 10/28/2012 for a clinical T1c N0, stage IA invasive ductal carcinoma, grade 2, estrogen and progesterone receptor positive, HER-2 not amplified, with an MIB-1 of 20%  (0) Jolee's genetics show a VUS, namely TP53 c.868C>T.    (1) s/p Right lumpectomy and sentinel lymph node sampling 11/17/2012 for a pT2 pN0, stage IIA invasive ductal carcinoma with prominent lobular features, grade 2, with initially positive  margins cleared intraoperatively; estrogen receptor positive with an Allred score of 7, progesterone receptor positive with an Allred score of 8, HercepTest 0, proliferation index by ACIS III 32% ([id number])   (2) Oncotype DX score of 33 predicting a risk of distant recurrence within 10 years of 23% if her only systemic treatment is tamoxifen for 5 years; the estrogen receptor was interpreted as negative on the Oncotype report  (3) adjuvant cyclophosphamide and docetaxel started 12/30/2012, first cycle given at [facility], with the remaining cycles given  at [facility] and completed 4th cycle 03/03/2013  (4)  adjuvant radiation therapy completed 05/10/2013  (5) started tamoxifen January 2015, stopped March  2015 due to depression  (6) anastrozole started 11/12/2013, discontinued 02/24/2014 with significant arthralgias/myalgias, resumed November 2015 with no improvement in the arthralgias myalgias, again discontinued  (7) exemestane started November 2015  (a) held as of 03/14/2015 because of arthralgias and myalgias.  (8) letrozole started 05/16/2015 stopped 02/2018 by patient due to depletion of calcium   (a) bone density 04/23/2015 at [facility] shows osteoporosis with a T score of -3.0  (b) ibandronate started 02/15/2016, discontinued March 2019  (c) bone density 05/19/2017 at [facility] showed a T score of -3.1 osteoporosis.  (d) Zoledronate started on 08/25/2018, repeated every 6 months  PLAN:  [name] is doing well today.  I do not palpate any abnormality in her left breast.  However her initial breast cancer was hard to detect also, and it was larger, and she has smaller breasts, so she is understandably concerned about her ability to feel a difference.  I have placed an order for a left breast diagnostic mammogram and ultrasound for her to undergo at [facility] at their next opening this week.  She is very grateful for this.    She will return in 02/2020 for labs, f/u with Dr. Jana Hakim, along with her annual zometa.  She was recommended to continue with the appropriate pandemic precautions. She knows to call for any questions that may arise between now and her next appointment.  We are happy to see her sooner if needed.  Total encounter time: 20 minutes*  Wilber Bihari, NP 10/18/19 2:07 PM Medical Oncology and Hematology [facility] [place], [place] [address] Tel. [phone]    Fax. [phone]  *Total Encounter Time as defined by the Centers for Medicare and Medicaid Services includes, in addition to the face-to-face time of a patient visit (documented in the note above) non-face-to-face time: obtaining and reviewing outside history, ordering and reviewing medications,  tests or procedures, care coordination (communications with other health care professionals or caregivers) and documentation in the medical record.

## 2019-10-18 ENCOUNTER — Encounter: Payer: Self-pay | Admitting: Adult Health

## 2019-10-18 ENCOUNTER — Inpatient Hospital Stay: Payer: BC Managed Care – PPO | Attending: Adult Health | Admitting: Adult Health

## 2019-10-18 ENCOUNTER — Other Ambulatory Visit: Payer: Self-pay

## 2019-10-18 VITALS — BP 134/62 | HR 70 | Temp 98.7°F | Resp 18 | Ht 63.0 in | Wt 136.2 lb

## 2019-10-18 DIAGNOSIS — F419 Anxiety disorder, unspecified: Secondary | ICD-10-CM | POA: Insufficient documentation

## 2019-10-18 DIAGNOSIS — C50311 Malignant neoplasm of lower-inner quadrant of right female breast: Secondary | ICD-10-CM

## 2019-10-18 DIAGNOSIS — Z923 Personal history of irradiation: Secondary | ICD-10-CM | POA: Insufficient documentation

## 2019-10-18 DIAGNOSIS — J45909 Unspecified asthma, uncomplicated: Secondary | ICD-10-CM | POA: Diagnosis not present

## 2019-10-18 DIAGNOSIS — Z9221 Personal history of antineoplastic chemotherapy: Secondary | ICD-10-CM | POA: Diagnosis not present

## 2019-10-18 DIAGNOSIS — Z79811 Long term (current) use of aromatase inhibitors: Secondary | ICD-10-CM | POA: Insufficient documentation

## 2019-10-18 DIAGNOSIS — Z86010 Personal history of colonic polyps: Secondary | ICD-10-CM | POA: Diagnosis not present

## 2019-10-18 DIAGNOSIS — Z170 Estrogen receptor positive status [ER+]: Secondary | ICD-10-CM | POA: Insufficient documentation

## 2019-10-18 DIAGNOSIS — M810 Age-related osteoporosis without current pathological fracture: Secondary | ICD-10-CM | POA: Insufficient documentation

## 2019-10-18 DIAGNOSIS — K219 Gastro-esophageal reflux disease without esophagitis: Secondary | ICD-10-CM | POA: Diagnosis not present

## 2019-10-19 ENCOUNTER — Telehealth: Payer: Self-pay | Admitting: Adult Health

## 2019-10-19 NOTE — Telephone Encounter (Signed)
No 5/4 los. No changes made to pt's schedule.  

## 2019-10-25 DIAGNOSIS — G4733 Obstructive sleep apnea (adult) (pediatric): Secondary | ICD-10-CM | POA: Diagnosis not present

## 2019-10-26 DIAGNOSIS — G4733 Obstructive sleep apnea (adult) (pediatric): Secondary | ICD-10-CM | POA: Diagnosis not present

## 2019-10-26 DIAGNOSIS — N6489 Other specified disorders of breast: Secondary | ICD-10-CM | POA: Diagnosis not present

## 2019-10-26 DIAGNOSIS — R922 Inconclusive mammogram: Secondary | ICD-10-CM | POA: Diagnosis not present

## 2019-10-26 DIAGNOSIS — Z853 Personal history of malignant neoplasm of breast: Secondary | ICD-10-CM | POA: Diagnosis not present

## 2019-11-11 DIAGNOSIS — G4733 Obstructive sleep apnea (adult) (pediatric): Secondary | ICD-10-CM | POA: Diagnosis not present

## 2019-12-07 ENCOUNTER — Other Ambulatory Visit: Payer: Self-pay | Admitting: Psychiatry

## 2019-12-08 NOTE — Telephone Encounter (Signed)
Next apt 07/21, okay for another refill until apt?

## 2019-12-29 ENCOUNTER — Ambulatory Visit: Payer: BC Managed Care – PPO | Admitting: Psychiatry

## 2020-01-04 ENCOUNTER — Encounter: Payer: Self-pay | Admitting: Psychiatry

## 2020-01-04 ENCOUNTER — Other Ambulatory Visit: Payer: Self-pay

## 2020-01-04 ENCOUNTER — Ambulatory Visit (INDEPENDENT_AMBULATORY_CARE_PROVIDER_SITE_OTHER): Payer: BC Managed Care – PPO | Admitting: Psychiatry

## 2020-01-04 DIAGNOSIS — F3342 Major depressive disorder, recurrent, in full remission: Secondary | ICD-10-CM | POA: Diagnosis not present

## 2020-01-04 DIAGNOSIS — F5105 Insomnia due to other mental disorder: Secondary | ICD-10-CM

## 2020-01-04 DIAGNOSIS — F411 Generalized anxiety disorder: Secondary | ICD-10-CM

## 2020-01-04 MED ORDER — BUPROPION HCL ER (XL) 150 MG PO TB24: 300.0000 mg | ORAL_TABLET | Freq: Every day | ORAL | 0 refills | Status: DC

## 2020-01-04 MED ORDER — DIAZEPAM 5 MG PO TABS: 5.0000 mg | ORAL_TABLET | Freq: Every evening | ORAL | 3 refills | Status: DC | PRN

## 2020-01-04 NOTE — Progress Notes (Signed)
[name] [id number] June 24, 1960 59 y.o.  Subjective:   Patient ID:  [name] is a 59 y.o. (DOB [birth year]) female.  Chief Complaint:  Chief Complaint  Patient presents with  . Follow-up    Depression        Associated symptoms include no decreased concentration and no suicidal ideas.  Past medical history includes anxiety.   Anxiety Patient reports no confusion, decreased concentration, nervous/anxious behavior or suicidal ideas.     [name] presents to the office today for follow-up of depression and ADD.  Last seen August 23, 2018.  She had reduced the Abilify to 5 mg and was doing well.  She wanted to try to taper off of that medication.   visit June 2020.  Reduced abilify to 2 mg daily without any problems.   visit October 2020.  She was still doing well and she wanted to try discontinuing the Abilify which was reasonable so it was stopped.  January 2021 appointment with the following noted: No meds were changed. Healthy.  No problem off the Abilify.  Depression is under control.  Very stressful last 2 weeks which created some anxiety.  Still working long hours. Stopped Vyvanse bc didn't feel she needed it any longer about 3 weeks ago.  Tremor resolved.  No worse depression.  Other stressors have occurred related to the marriage.  Anxiety is still manageable.  Sleep 7 hours until worse last 2 weeks with work demands.  Sometimes goes to bed to late.  Work from home and a lot. 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  Mother living with her until July.   Sleep OK usually.  Diazepam mainly used for sleep and it helps with Flexeril. Wants to eventually try reducing Wellbutrin to see if tremor is better.  Dating and feels better.  January 04, 2020 appointment with the following noted: Still doing fine with mood.  Both depression and anxiety managed.  Wants to reduce Wellbutrin to 150 mg daily. Diazepam about once weekly for sleep.  Usually stress work  related.  Past Psychiatric Medication Trials: Temazepam, clonazepam, Ambien no response, Flexeril, Abilify 10, diazepam, Vyvanse, bupropion 450 tremor, buspirone 30 twice daily [place], lithium, lamotrigine, venlafaxine hypomania, fluoxetine,  Adderall, Vyvanse   Review of Systems:  Review of Systems  Constitutional: Positive for unexpected weight change.  Musculoskeletal: Positive for arthralgias.  Neurological: Negative for tremors and weakness.  Psychiatric/Behavioral: Positive for depression and sleep disturbance. Negative for agitation, behavioral problems, confusion, decreased concentration, dysphoric mood, hallucinations, self-injury and suicidal ideas. The patient is not nervous/anxious and is not hyperactive.     Medications: I have reviewed the patient's current medications.  Current Outpatient Medications  Medication Sig Dispense Refill  . Multiple Vitamin (MULTIVITAMIN) tablet Take 1 tablet by mouth daily.    Marland Kitchen buPROPion (WELLBUTRIN XL) 150 MG 24 hr tablet Take 2 tablets (300 mg total) by mouth daily. 90 tablet 0  . diazepam (VALIUM) 5 MG tablet Take 1 tablet (5 mg total) by mouth at bedtime as needed (Sleep). 30 tablet 3   No current facility-administered medications for this visit.    Medication Side Effects: None  Allergies:  Allergies  Allergen Reactions  . Morphine And Related Itching    Past Medical History:  Diagnosis Date  . Anxiety   . Asthma    as a child, early adult...none now  . BRCA negative 06/2013   BRCA I & II negative  . Breast cancer ([place]) 10/28/12  right breast  . Colonic mass 07/28/2011   ascending colon and found a polyp 2.5 cm adenoma - hemicoloectomy  . Depression   . GERD (gastroesophageal reflux disease)    occas  at  night....twice a  month--otc meds  . Recurrent upper respiratory infection (URI)    started 1/28.....getting better  . S/P chemotherapy, time since greater than 12 weeks     Family History  Problem Relation Age of  Onset  . Heart disease Father   . Cancer Paternal Aunt        unknown form of cancer  . Lung cancer Maternal Grandmother   . Testicular cancer Cousin        paternal cousin; died in 76s  . Pancreatic cancer Cousin 40  . Colon cancer Other        paternal grandmother's sister  . Cancer Other        MGM's sister    Social History   Socioeconomic History  . Marital status: Divorced    Spouse name: [name]  . Number of children: 2  . Years of education: Not on file  . Highest education level: Not on file  Occupational History    Employer: [employer]  Tobacco Use  . Smoking status: Never Smoker  . Smokeless tobacco: Never Used  Substance and Sexual Activity  . Alcohol use: Yes    Comment: wine occasionally  . Drug use: No  . Sexual activity: Yes    Birth control/protection: Surgical  Other Topics Concern  . Not on file  Social History Narrative  . Not on file   Social Determinants of Health   Financial Resource Strain:   . Difficulty of Paying Living Expenses:   Food Insecurity:   . Worried About Programme researcher, broadcasting/film/video in the Last Year:   . Barista in the Last Year:   Transportation Needs:   . Freight forwarder (Medical):   Marland Kitchen Lack of Transportation (Non-Medical):   Physical Activity:   . Days of Exercise per Week:   . Minutes of Exercise per Session:   Stress:   . Feeling of Stress :   Social Connections:   . Frequency of Communication with Friends and Family:   . Frequency of Social Gatherings with Friends and Family:   . Attends Religious Services:   . Active Member of Clubs or Organizations:   . Attends Banker Meetings:   Marland Kitchen Marital Status:   Intimate Partner Violence:   . Fear of Current or Ex-Partner:   . Emotionally Abused:   Marland Kitchen Physically Abused:   . Sexually Abused:     Past Medical History, Surgical history, Social history, and Family history were reviewed and updated as appropriate.   Please see review of systems for further  details on the patient's review from today.   Objective:   Physical Exam:  There were no vitals taken for this visit.  Physical Exam Constitutional:      General: She is not in acute distress.    Appearance: She is well-developed.  Musculoskeletal:        General: No deformity.  Neurological:     Mental Status: She is alert and oriented to person, place, and time.     Motor: No tremor.     Coordination: Coordination normal.     Gait: Gait normal.  Psychiatric:        Attention and Perception: She is attentive.        Mood and  Affect: Mood is not anxious or depressed. Affect is not labile, blunt, angry or inappropriate.        Speech: Speech normal.        Behavior: Behavior normal.        Thought Content: Thought content normal. Thought content does not include homicidal or suicidal ideation. Thought content does not include homicidal or suicidal plan.        Cognition and Memory: Cognition normal.        Judgment: Judgment normal.     Comments: Insight intact. No auditory or visual hallucinations. No delusions.      Lab Review:     Component Value Date/Time   NA 142 02/24/2019 0939   NA 139 02/09/2017 0911   K 4.4 02/24/2019 0939   K 4.0 02/09/2017 0911   CL 105 02/24/2019 0939   CL 102 11/03/2012 0821   CO2 27 02/24/2019 0939   CO2 26 02/09/2017 0911   GLUCOSE 87 02/24/2019 0939   GLUCOSE 104 02/09/2017 0911   GLUCOSE 93 11/03/2012 0821   BUN 21 (H) 02/24/2019 0939   BUN 15.5 02/09/2017 0911   CREATININE 0.91 02/24/2019 0939   CREATININE 0.9 02/09/2017 0911   CALCIUM 10.1 02/24/2019 0939   CALCIUM 9.9 02/09/2017 0911   PROT 7.5 02/24/2019 0939   PROT 7.4 02/09/2017 0911   ALBUMIN 4.6 02/24/2019 0939   ALBUMIN 4.1 02/09/2017 0911   AST 20 02/24/2019 0939   AST 17 02/09/2017 0911   ALT 16 02/24/2019 0939   ALT 14 02/09/2017 0911   ALKPHOS 45 02/24/2019 0939   ALKPHOS 60 02/09/2017 0911   BILITOT 0.3 02/24/2019 0939   BILITOT 0.45 02/09/2017 0911    GFRNONAA >60 02/24/2019 0939   GFRAA >60 02/24/2019 0939       Component Value Date/Time   WBC 6.7 02/24/2019 0939   RBC 4.60 02/24/2019 0939   HGB 13.2 02/24/2019 0939   HGB 12.6 02/09/2017 0911   HCT 40.4 02/24/2019 0939   HCT 38.8 02/09/2017 0911   PLT 324 02/24/2019 0939   PLT 295 02/09/2017 0911   MCV 87.8 02/24/2019 0939   MCV 87.4 02/09/2017 0911   MCH 28.7 02/24/2019 0939   MCHC 32.7 02/24/2019 0939   RDW 13.5 02/24/2019 0939   RDW 14.2 02/09/2017 0911   LYMPHSABS 2.0 02/24/2019 0939   LYMPHSABS 1.6 02/09/2017 0911   MONOABS 0.6 02/24/2019 0939   MONOABS 0.5 02/09/2017 0911   EOSABS 0.3 02/24/2019 0939   EOSABS 0.1 02/09/2017 0911   BASOSABS 0.1 02/24/2019 0939   BASOSABS 0.1 02/09/2017 0911    No results found for: POCLITH, LITHIUM   No results found for: PHENYTOIN, PHENOBARB, VALPROATE, CBMZ   .res Assessment: Plan:    Generalized anxiety disorder - Plan: diazepam (VALIUM) 5 MG tablet  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  She wondered about reducing some of the medication.  She is tolerated the reduction Wellbutrin fine and her tremor is markedly better with a lower dose.    She would like to try weaning off the medication and she is done so far very well with reductions in the Wellbutrin.  Will reduce Wellbutrin to 150 mg daily for 1 to 2 months and if she still doing well she can  stop it.  Call if there is any recurrence of depression  She is using diazepam rarely for sleep and that is reasonable.  She agrees to the plan  Follow-up   Lynder Parents MD, DFAPA   Please see After Visit Summary for patient specific instructions.  Future Appointments  Date Time Provider [place]  03/08/2020  8:30 AM [facility] LAB 1  [facility] None  03/08/2020  9:00 AM Magrinat, Virgie Dad, MD [facility] None  03/08/2020 10:00 AM [facility] INFUSION [facility] None    No orders of the defined types were placed in this encounter.     -------------------------------

## 2020-02-08 DIAGNOSIS — R0981 Nasal congestion: Secondary | ICD-10-CM | POA: Diagnosis not present

## 2020-02-08 DIAGNOSIS — G4733 Obstructive sleep apnea (adult) (pediatric): Secondary | ICD-10-CM | POA: Diagnosis not present

## 2020-02-09 ENCOUNTER — Other Ambulatory Visit: Payer: Self-pay | Admitting: Psychiatry

## 2020-02-09 DIAGNOSIS — F3342 Major depressive disorder, recurrent, in full remission: Secondary | ICD-10-CM

## 2020-02-10 NOTE — Telephone Encounter (Signed)
Fill RX

## 2020-02-11 ENCOUNTER — Other Ambulatory Visit: Payer: Self-pay | Admitting: Psychiatry

## 2020-02-11 DIAGNOSIS — F3342 Major depressive disorder, recurrent, in full remission: Secondary | ICD-10-CM

## 2020-02-17 DIAGNOSIS — Z1231 Encounter for screening mammogram for malignant neoplasm of breast: Secondary | ICD-10-CM | POA: Diagnosis not present

## 2020-02-24 ENCOUNTER — Other Ambulatory Visit: Payer: Self-pay | Admitting: Psychiatry

## 2020-02-24 DIAGNOSIS — F3342 Major depressive disorder, recurrent, in full remission: Secondary | ICD-10-CM

## 2020-02-27 NOTE — Telephone Encounter (Signed)
Review.

## 2020-03-06 ENCOUNTER — Other Ambulatory Visit: Payer: Self-pay

## 2020-03-06 DIAGNOSIS — C50311 Malignant neoplasm of lower-inner quadrant of right female breast: Secondary | ICD-10-CM

## 2020-03-07 NOTE — Progress Notes (Signed)
ID: [name] OB: Jan 28, 1961  MR#: [id number]  CSN#:[id number]  Patient Care Team: Carol Ada, Gonzalez as PCP - General (Family Medicine) Paralee Cancel, Gonzalez as Consulting Physician (Orthopedic Surgery) Bereket Gernert, Elizabeth Dad, Gonzalez as Consulting Physician (Oncology) Cottle, Billey Co., Gonzalez as Consulting Physician (Psychiatry) OTHER Gonzalez:   CHIEF COMPLAINT: Estrogen receptor positive breast cancer  CURRENT TREATMENT: zoledronate   INTERVAL HISTORY: [name] returns today for follow up of her estrogen receptor positive breast cancer.  She continues on Zoledronate yearly and she has a dose due today: Last dose was 02/24/2019  Digital mamm[facility] with [facility] at [facility] 02/17/2020 showed the breast density to be category D.  There was no evidence of malignancy.  On 10/26/2019 she had limited left breast ultrasonography for evaluation of a palpable mass and this showed no suspicious mass in the area of concern.  Her most recent bone density screening from 07/29/2019 at [facility] showed a T-score of -3.0.  Breast MRI with and without contrast 02/22/2019 continues to show breast density D.  There was no evidence of malignancy.   REVIEW OF SYSTEMS: [name] had the [place] vaccine and tolerated it well.  She tells me she is getting married this Saturday--I have updated the information and the social history below.  She is not exercising regularly.  She does have an elliptical at home that she could use.  A detailed review of systems today was otherwise stable.  HISTORY OF PRESENT ILLNESS: From the original intake node:  [name] had a routine colonoscopy under Dr. Paulita Fujita approximately a year and a half ago, showing a right colon lesion which could not be removed intraoperatively. CT scans of the abdomen and pelvis 07/07/2011 were negative except for the question of a soft tissue lesion in the right colon, and particularly there was no adenopathy or evidence of metastatic disease. She underwent partial  colectomy under Dr. Madilyn Hook 07/28/2011, and the pathology from that procedure ([id number]) showed a 3.2 cm tubular adenoma without high-grade dysplasia or malignancy. Margins were negative. 0 of 16 lymph nodes were involved.  And the patient has been working at weight loss and managed to lose approximately 25 pounds over the past year. She feels this is what allowed her to palpate a mass in her right breast earlier this month. (She had had negative mammographic screening 02/26/2012). On 10/27/2012 the patient underwent bilateral diagnostic mammography. The breasts are "extremely dense". Mammography did not show any new or worrisome finding. Right breast ultrasound however located an irregular hypoechoic mass in the area of the patient's palpable abnormality. It measured approximately 7 mm. Biopsy of this mass 10/28/2012 showed an invasive ductal carcinoma, grade 2, estrogen and progesterone receptor positive, with no HER-2 amplification, and an MIB-1 of 5%.  Bilateral breast MRI 11/01/2012 found a 1.3 cm irregular enhancing mass in the lower inner quadrant of the right breast, with no other masses of concern in either breast and no abnormal appearing lymph nodes.  The patient's subsequent history is as detailed below.   PAST MEDICAL HISTORY: Past Medical History:  Diagnosis Date  . Anxiety   . Asthma    as a child, early adult...none now  . BRCA negative 06/2013   BRCA I & II negative  . Breast cancer ([place]) 10/28/12   right breast  . Colonic mass 07/28/2011   ascending colon and found a polyp 2.5 cm adenoma - hemicoloectomy  . Depression   . GERD (gastroesophageal reflux disease)    occas  at  night....twice a  month--otc meds  . Recurrent upper respiratory infection (URI)    started 1/28.....getting better  . S/P chemotherapy, time since greater than 12 weeks      PAST SURGICAL HISTORY: Past Surgical History:  Procedure Laterality Date  . APPENDECTOMY    . BREAST BIOPSY Right  10/28/12   Invasive mammary ca, ER/PR=+, Her2 Neu-  . ENDOMETRIAL ABLATION  2006   HTA  . HEMICOLECTOMY  07/28/11  . TUBAL LIGATION  2006    FAMILY HISTORY Family History  Problem Relation Age of Onset  . Heart disease Father   . Cancer Paternal Aunt        unknown form of cancer  . Lung cancer Maternal Grandmother   . Testicular cancer Cousin        paternal cousin; died in 54s  . Pancreatic cancer Cousin 58  . Colon cancer Other        paternal grandmother's sister  . Cancer Other        MGM's sister   the patient's father died from congestive heart failure at the age of 82. The patient's mother is still living, in her early 78s. The patient has one brother, 4 sisters. There is no history of breast or ovarian cancer in the family to the patient's knowledge.   GYNECOLOGIC HISTORY:  Menarche age 15, first live birth age 70. The patient's less. It was September 2013 and the one before that was March 2013. She is not taking hormone replacement. She did take oral contraceptives for several years intermittently in the past. There were no complications.    SOCIAL HISTORY:  (Updated September 2021) [name] is originally from [place]. 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 She is not a counselor herself.  Her former husband, [name] (goes by "[name]") works as a Social worker for the [employer] as well as having his own clinic in [place]. He is originally from [place].  They divorced over 2019.  The patient will marry [name] on 03/10/2020.  He is an Chief Financial Officer working for [employer] and has 2 children from a prior marriage, both living in [place].  Aluna's children are [name], who lives in [place] and has a Oceanographer in counseling; and [name], who is working in Engineer, technical sales.  By the end of 2021 [name] will have 5 biological grandchildren.   ADVANCED DIRECTIVES:    HEALTH MAINTENANCE: Social  History   Tobacco Use  . Smoking status: Never Smoker  . Smokeless tobacco: Never Used  Substance Use Topics  . Alcohol use: Yes    Comment: wine occasionally  . Drug use: No     Colonoscopy: 06/02/2011 / [facility]  PAP:  Bone density: 05/19/2017 at [facility] showed a T score of -3.1 osteoporosis.  Lipid panel:   Allergies  Allergen Reactions  . Morphine And Related Itching   Current Outpatient Medications  Medication Sig Dispense Refill  . buPROPion (WELLBUTRIN XL) 150 MG 24 hr tablet TAKE 2 TABLETS (300 MG TOTAL) BY MOUTH DAILY. 180 tablet 1  . diazepam (VALIUM) 5 MG tablet Take 1 tablet (5 mg total) by mouth at bedtime as needed (Sleep). 30 tablet 3  . Multiple Vitamin (MULTIVITAMIN) tablet Take 1 tablet by mouth daily.     No current facility-administered medications for this visit.    OBJECTIVE: Middle-aged white woman who appears well Vitals:   03/08/20 0850  BP: (!) 106/57  Pulse: 80  Resp: 18  Temp: Marland Kitchen)  97.4 F (36.3 C)  SpO2: 100%     Body mass index is 25.31 kg/m.    ECOG FS: 1  Sclerae unicteric, EOMs intact Wearing a mask No cervical or supraclavicular adenopathy Lungs no rales or rhonchi Heart regular rate and rhythm Abd soft, nontender, positive bowel sounds MSK no focal spinal tenderness, no upper extremity lymphedema Neuro: nonfocal, well oriented, appropriate affect Breasts: The right breast is status post lumpectomy and radiation.  There is no evidence of local recurrence.  Left breast is benign.  Both axillae are benign  LAB RESULTS: Lab Results  Component Value Date   WBC 5.8 03/08/2020   NEUTROABS 3.1 03/08/2020   HGB 13.6 03/08/2020   HCT 41.7 03/08/2020   MCV 88.5 03/08/2020   PLT 290 03/08/2020      Chemistry      Component Value Date/Time   NA 141 03/08/2020 0835   NA 139 02/09/2017 0911   K 4.2 03/08/2020 0835   K 4.0 02/09/2017 0911   CL 104 03/08/2020 0835   CL 102 11/03/2012 0821   CO2 28 03/08/2020 0835   CO2 26  02/09/2017 0911   BUN 16 03/08/2020 0835   BUN 15.5 02/09/2017 0911   CREATININE 1.06 (H) 03/08/2020 0835   CREATININE 0.9 02/09/2017 0911      Component Value Date/Time   CALCIUM 9.7 03/08/2020 0835   CALCIUM 9.9 02/09/2017 0911   ALKPHOS 54 03/08/2020 0835   ALKPHOS 60 02/09/2017 0911   AST 17 03/08/2020 0835   AST 17 02/09/2017 0911   ALT 16 03/08/2020 0835   ALT 14 02/09/2017 0911   BILITOT 0.5 03/08/2020 0835   BILITOT 0.45 02/09/2017 0911      STUDIES: No results found.    ASSESSMENT: 59 y.o. [place] woman status post right breast lower inner quadrant biopsy 10/28/2012 for a clinical T1c N0, stage IA invasive ductal carcinoma, grade 2, estrogen and progesterone receptor positive, HER-2 not amplified, with an MIB-1 of 20%  (0) [name]'s genetics show a VUS, namely TP53 c.[id number]>T.    (1) s/p Right lumpectomy and sentinel lymph node sampling 11/17/2012 for a pT2 pN0, stage IIA invasive ductal carcinoma with prominent lobular features, grade 2, with initially positive  margins cleared intraoperatively; estrogen receptor positive with an Allred score of 7, progesterone receptor positive with an Allred score of 8, HercepTest 0, proliferation index by ACIS III 32% ([id number])   (2) Oncotype DX score of 33 predicting a risk of distant recurrence within 10 years of 23% if her only systemic treatment is tamoxifen for 5 years; the estrogen receptor was interpreted as negative on the Oncotype report  (3) adjuvant cyclophosphamide and docetaxel started 12/30/2012, first cycle given at [facility], with the remaining cycles given  at [facility] and completed 4th cycle 03/03/2013  (4)  adjuvant radiation therapy completed 05/10/2013  (5) started tamoxifen January 2015, stopped March 2015 due to depression  (6) anastrozole started 11/12/2013, discontinued 02/24/2014 with significant arthralgias/myalgias, resumed November 2015 with no improvement in the arthralgias myalgias, again  discontinued  (7) exemestane started November 2015  (a) held as of 03/14/2015 because of arthralgias and myalgias.  (8) letrozole started 05/16/2015 stopped 02/2018 by patient due to depletion of calcium   (a) bone density 04/23/2015 at [facility] shows osteoporosis with a T score of -3.0  (b) ibandronate started 02/15/2016, discontinued March 2019  (c) bone density 05/19/2017 at [facility] showed a T score of -3.1 osteoporosis.  (d) Zoledronate started on 08/25/2018, repeated every  6 months  (e) repeat bone density 07/29/2019 shows a T score of -3.0.   PLAN:  Tiyanna is now a little over 7 years out from definitive surgery for her breast cancer with no evidence of disease recurrence.  This is very favorable.  We are seeing her primarily because of her continuing concern regarding dense breasts.  They remain category D.  She just got her mammography.  I am setting her up for abbreviated breast MRI in March 2022.  We also of course see her for osteoporosis.  She will receive zoledronate today.  She will return in 1 year for the same treatment and evaluation  I congratulated her on her wedding this Saturday!  Total encounter time 25 minutes.Elizabeth Jews C. Naidelyn Parrella, Gonzalez 03/08/20 9:20 AM Medical Oncology and Hematology [facility], [place] [address] Tel. [phone]    Fax. [phone]   I, [name], am acting as scribe for Dr. Virgie Gonzalez. [name].  I, [name], have reviewed the above documentation for accuracy and completeness, and I agree with the above.    *Total Encounter Time as defined by the Centers for Medicare and Medicaid Services includes, in addition to the face-to-face time of a patient visit (documented in the note above) non-face-to-face time: obtaining and reviewing outside history, ordering and reviewing medications, tests or procedures, care coordination (communications with other health care professionals or caregivers)  and documentation in the medical record.

## 2020-03-08 ENCOUNTER — Other Ambulatory Visit: Payer: Self-pay

## 2020-03-08 ENCOUNTER — Inpatient Hospital Stay: Payer: BC Managed Care – PPO

## 2020-03-08 ENCOUNTER — Inpatient Hospital Stay: Payer: BC Managed Care – PPO | Attending: Oncology

## 2020-03-08 ENCOUNTER — Telehealth: Payer: Self-pay | Admitting: Oncology

## 2020-03-08 ENCOUNTER — Inpatient Hospital Stay: Payer: BC Managed Care – PPO | Admitting: Oncology

## 2020-03-08 VITALS — BP 106/57 | HR 80 | Temp 97.4°F | Resp 18 | Ht 63.0 in | Wt 142.9 lb

## 2020-03-08 DIAGNOSIS — C50311 Malignant neoplasm of lower-inner quadrant of right female breast: Secondary | ICD-10-CM

## 2020-03-08 DIAGNOSIS — Z79899 Other long term (current) drug therapy: Secondary | ICD-10-CM | POA: Diagnosis not present

## 2020-03-08 DIAGNOSIS — J45909 Unspecified asthma, uncomplicated: Secondary | ICD-10-CM | POA: Insufficient documentation

## 2020-03-08 DIAGNOSIS — K219 Gastro-esophageal reflux disease without esophagitis: Secondary | ICD-10-CM | POA: Insufficient documentation

## 2020-03-08 DIAGNOSIS — Z170 Estrogen receptor positive status [ER+]: Secondary | ICD-10-CM | POA: Insufficient documentation

## 2020-03-08 DIAGNOSIS — Z801 Family history of malignant neoplasm of trachea, bronchus and lung: Secondary | ICD-10-CM | POA: Diagnosis not present

## 2020-03-08 DIAGNOSIS — Z9221 Personal history of antineoplastic chemotherapy: Secondary | ICD-10-CM | POA: Insufficient documentation

## 2020-03-08 DIAGNOSIS — Z800 Family history of malignant neoplasm of digestive organs: Secondary | ICD-10-CM | POA: Insufficient documentation

## 2020-03-08 DIAGNOSIS — F419 Anxiety disorder, unspecified: Secondary | ICD-10-CM | POA: Diagnosis not present

## 2020-03-08 DIAGNOSIS — F329 Major depressive disorder, single episode, unspecified: Secondary | ICD-10-CM | POA: Diagnosis not present

## 2020-03-08 DIAGNOSIS — Z923 Personal history of irradiation: Secondary | ICD-10-CM | POA: Diagnosis not present

## 2020-03-08 DIAGNOSIS — Z8043 Family history of malignant neoplasm of testis: Secondary | ICD-10-CM | POA: Insufficient documentation

## 2020-03-08 DIAGNOSIS — M818 Other osteoporosis without current pathological fracture: Secondary | ICD-10-CM

## 2020-03-08 LAB — CMP (CANCER CENTER ONLY)
ALT: 16 U/L (ref 0–44)
AST: 17 U/L (ref 15–41)
Albumin: 4 g/dL (ref 3.5–5.0)
Alkaline Phosphatase: 54 U/L (ref 38–126)
Anion gap: 9 (ref 5–15)
BUN: 16 mg/dL (ref 6–20)
CO2: 28 mmol/L (ref 22–32)
Calcium: 9.7 mg/dL (ref 8.9–10.3)
Chloride: 104 mmol/L (ref 98–111)
Creatinine: 1.06 mg/dL — ABNORMAL HIGH (ref 0.44–1.00)
GFR, Est AFR Am: >60 mL/min (ref >60)
GFR, Estimated: 57 mL/min — ABNORMAL LOW (ref >60)
Glucose, Bld: 79 mg/dL (ref 70–99)
Potassium: 4.2 mmol/L (ref 3.5–5.1)
Sodium: 141 mmol/L (ref 135–145)
Total Bilirubin: 0.5 mg/dL (ref 0.3–1.2)
Total Protein: 7.5 g/dL (ref 6.5–8.1)

## 2020-03-08 LAB — CBC WITH DIFFERENTIAL (CANCER CENTER ONLY)
Abs Immature Granulocytes: 0 10*3/uL (ref 0.00–0.07)
Basophils Absolute: 0.1 10*3/uL (ref 0.0–0.1)
Basophils Relative: 1 %
Eosinophils Absolute: 0.3 10*3/uL (ref 0.0–0.5)
Eosinophils Relative: 5 %
HCT: 41.7 % (ref 36.0–46.0)
Hemoglobin: 13.6 g/dL (ref 12.0–15.0)
Immature Granulocytes: 0 %
Lymphocytes Relative: 34 %
Lymphs Abs: 2 10*3/uL (ref 0.7–4.0)
MCH: 28.9 pg (ref 26.0–34.0)
MCHC: 32.6 g/dL (ref 30.0–36.0)
MCV: 88.5 fL (ref 80.0–100.0)
Monocytes Absolute: 0.4 10*3/uL (ref 0.1–1.0)
Monocytes Relative: 8 %
Neutro Abs: 3.1 10*3/uL (ref 1.7–7.7)
Neutrophils Relative %: 52 %
Platelet Count: 290 10*3/uL (ref 150–400)
RBC: 4.71 MIL/uL (ref 3.87–5.11)
RDW: 13 % (ref 11.5–15.5)
WBC Count: 5.8 10*3/uL (ref 4.0–10.5)
nRBC: 0 % (ref 0.0–0.2)

## 2020-03-08 MED ORDER — ZOLEDRONIC ACID 4 MG/100ML IV SOLN
4.0000 mg | Freq: Once | INTRAVENOUS | Status: AC
  Administered 2020-03-08: 4 mg via INTRAVENOUS

## 2020-03-08 MED ORDER — ZOLEDRONIC ACID 4 MG/100ML IV SOLN
INTRAVENOUS | Status: AC
  Filled 2020-03-08: qty 100

## 2020-03-08 NOTE — Telephone Encounter (Signed)
Scheduled appts per 9/23 los. Gave pt a print out of AVS.  

## 2020-03-08 NOTE — Patient Instructions (Signed)

## 2020-03-20 DIAGNOSIS — Z20822 Contact with and (suspected) exposure to covid-19: Secondary | ICD-10-CM | POA: Diagnosis not present

## 2020-03-27 DIAGNOSIS — Z20822 Contact with and (suspected) exposure to covid-19: Secondary | ICD-10-CM | POA: Diagnosis not present

## 2020-04-05 DIAGNOSIS — Z23 Encounter for immunization: Secondary | ICD-10-CM | POA: Diagnosis not present

## 2020-05-16 DIAGNOSIS — F4322 Adjustment disorder with anxiety: Secondary | ICD-10-CM | POA: Diagnosis not present

## 2020-06-16 ENCOUNTER — Encounter (HOSPITAL_COMMUNITY): Payer: Self-pay | Admitting: *Deleted

## 2020-06-16 ENCOUNTER — Emergency Department (HOSPITAL_COMMUNITY): Payer: BC Managed Care – PPO

## 2020-06-16 ENCOUNTER — Other Ambulatory Visit: Payer: Self-pay

## 2020-06-16 ENCOUNTER — Emergency Department (HOSPITAL_COMMUNITY)
Admission: EM | Admit: 2020-06-16 | Discharge: 2020-06-16 | Disposition: A | Discharge status: Home / Self Care | Payer: BC Managed Care – PPO | Attending: Emergency Medicine | Admitting: Emergency Medicine

## 2020-06-16 DIAGNOSIS — S52551A Other extraarticular fracture of lower end of right radius, initial encounter for closed fracture: Secondary | ICD-10-CM

## 2020-06-16 DIAGNOSIS — J45909 Unspecified asthma, uncomplicated: Secondary | ICD-10-CM | POA: Diagnosis not present

## 2020-06-16 DIAGNOSIS — W19XXXA Unspecified fall, initial encounter: Secondary | ICD-10-CM | POA: Insufficient documentation

## 2020-06-16 DIAGNOSIS — Z853 Personal history of malignant neoplasm of breast: Secondary | ICD-10-CM | POA: Diagnosis not present

## 2020-06-16 DIAGNOSIS — S52611A Displaced fracture of right ulna styloid process, initial encounter for closed fracture: Secondary | ICD-10-CM | POA: Diagnosis not present

## 2020-06-16 DIAGNOSIS — M7989 Other specified soft tissue disorders: Secondary | ICD-10-CM | POA: Diagnosis not present

## 2020-06-16 DIAGNOSIS — S6991XA Unspecified injury of right wrist, hand and finger(s), initial encounter: Secondary | ICD-10-CM | POA: Diagnosis not present

## 2020-06-16 DIAGNOSIS — Z79899 Other long term (current) drug therapy: Secondary | ICD-10-CM | POA: Insufficient documentation

## 2020-06-16 DIAGNOSIS — M25531 Pain in right wrist: Secondary | ICD-10-CM | POA: Diagnosis not present

## 2020-06-16 MED ORDER — HYDROCODONE-ACETAMINOPHEN 5-325 MG PO TABS: 1.0000 | ORAL_TABLET | ORAL | 0 refills | Status: DC | PRN

## 2020-06-16 MED ORDER — HYDROCODONE-ACETAMINOPHEN 5-325 MG PO TABS
1.0000 | ORAL_TABLET | Freq: Once | ORAL | Status: AC
  Administered 2020-06-16: 1 via ORAL
  Filled 2020-06-16: qty 1

## 2020-06-16 NOTE — ED Notes (Signed)
Ortho tech called 

## 2020-06-16 NOTE — ED Provider Notes (Signed)
[facility] DEPT Provider Note   CSN: [id number] Arrival date & time: 06/16/20  1541     History Chief Complaint  Patient presents with  . Wrist Pain    [name] is a 60 y.o. female with no significant past medical history who presents for evaluation of right wrist pain.  Patient states she was on a hover board when she fell never got for Christmas.  States she fell off on an outstretched right hand.  She denies hitting her head, LOC or any coagulation.  States she felt a "crunch" to her right wrist.  Had noted some swelling and pain.  She took a Percocet at home at approximately 230.  States this helped resolve her pain.  She denies headache, lightheadedness, dizziness, nausea, vomiting, chest pain, shortness breath, abdominal pain,  Paresthesias, weakness, redness or warmth to extremities.  Denies additional aggravating or alleviating factors  History obtained from patient and past medical records.  No interpreter used.  HPI     Past Medical History:  Diagnosis Date  . Anxiety   . Asthma    as a child, early adult...none now  . BRCA negative 06/2013   BRCA I & II negative  . Breast cancer ([place]) 10/28/12   right breast  . Colonic mass 07/28/2011   ascending colon and found a polyp 2.5 cm adenoma - hemicoloectomy  . Depression   . GERD (gastroesophageal reflux disease)    occas  at  night....twice a  month--otc meds  . Recurrent upper respiratory infection (URI)    started 1/28.....getting better  . S/P chemotherapy, time since greater than 12 weeks     Patient Active Problem List   Diagnosis Date Noted  . ADD (attention deficit disorder) 05/10/2018  . GAD (generalized anxiety disorder) 05/10/2018  . Osteoporosis 08/10/2017  . Arthritis 06/06/2015  . Depression 10/10/2013  . Malignant neoplasm of lower-inner quadrant of right breast of female, estrogen receptor positive ([place]) 03/15/2013  . Constipation 02/17/2013  . GERD  (gastroesophageal reflux disease) 01/27/2013  . Anemia, unspecified 01/27/2013    Past Surgical History:  Procedure Laterality Date  . APPENDECTOMY    . BREAST BIOPSY Right 10/28/12   Invasive mammary ca, ER/PR=+, Her2 Neu-  . ENDOMETRIAL ABLATION  2006   HTA  . HEMICOLECTOMY  07/28/11  . TUBAL LIGATION  2006     OB History    Gravida  2   Para  2   Term      Preterm      AB      Living        SAB      IAB      Ectopic      Multiple      Live Births              Family History  Problem Relation Age of Onset  . Heart disease Father   . Cancer Paternal Aunt        unknown form of cancer  . Lung cancer Maternal Grandmother   . Testicular cancer Cousin        paternal cousin; died in 80s  . Pancreatic cancer Cousin 30  . Colon cancer Other        paternal grandmother's sister  . Cancer Other        MGM's sister    Social History   Tobacco Use  . Smoking status: Never Smoker  . Smokeless tobacco: Never Used  Substance  Use Topics  . Alcohol use: Yes    Comment: wine occasionally  . Drug use: No    Home Medications Prior to Admission medications   Medication Sig Start Date End Date Taking? Authorizing Provider  HYDROcodone-acetaminophen (NORCO/VICODIN) 5-325 MG tablet Take 1 tablet by mouth every 4 (four) hours as needed. 06/16/20  Yes Melroy Bougher A, PA-C  buPROPion (WELLBUTRIN XL) 150 MG 24 hr tablet TAKE 2 TABLETS (300 MG TOTAL) BY MOUTH DAILY. 02/27/20   Cottle, Billey Co., MD  diazepam (VALIUM) 5 MG tablet Take 1 tablet (5 mg total) by mouth at bedtime as needed (Sleep). 01/04/20   Cottle, Billey Co., MD  Multiple Vitamin (MULTIVITAMIN) tablet Take 1 tablet by mouth daily.    [provider]    Allergies    Morphine and related  Review of Systems   Review of Systems  Constitutional: Negative.   HENT: Negative.   Respiratory: Negative.   Cardiovascular: Negative.   Gastrointestinal: Negative.   Genitourinary: Negative.    Musculoskeletal:       Right wrist pain  Skin: Negative.   Neurological: Negative.   All other systems reviewed and are negative.   Physical Exam Updated Vital Signs BP 129/77   Pulse 70   Temp 98.9 F (37.2 C) (Oral)   Resp 18   SpO2 99%   Physical Exam Vitals and nursing note reviewed.  Constitutional:      General: She is not in acute distress.    Appearance: She is well-developed and well-nourished. She is not ill-appearing, toxic-appearing or diaphoretic.  HENT:     Head: Normocephalic and atraumatic.     Nose: Nose normal.     Mouth/Throat:     Mouth: Mucous membranes are moist.  Eyes:     Pupils: Pupils are equal, round, and reactive to light.  Cardiovascular:     Rate and Rhythm: Normal rate.     Pulses: Normal pulses and intact distal pulses.     Heart sounds: Normal heart sounds.  Pulmonary:     Effort: Pulmonary effort is normal. No respiratory distress.     Breath sounds: Normal breath sounds.  Abdominal:     General: Bowel sounds are normal. There is no distension.  Musculoskeletal:        General: Normal range of motion.     Cervical back: Normal range of motion.     Comments: Equal handgrip bilaterally.  Wiggles fingers without difficulty.  Tenderness to distal radius and ulna midshaft, proximal radius ulnar pain.  Full range of motion elbow, shoulder.  Mild swelling to right wrist.  Compartments soft.  Skin:    General: Skin is warm and dry.     Capillary Refill: Capillary refill takes less than 2 seconds.     Comments: Soft tissue swelling to right wrist.  No erythema, warmth, fluctuance or induration  Neurological:     General: No focal deficit present.     Mental Status: She is alert.     Cranial Nerves: Cranial nerves are intact.     Sensory: Sensation is intact.     Motor: Motor function is intact.     Gait: Gait is intact.     Comments: Equal handgrip bilaterally Intact sensation  Psychiatric:        Mood and Affect: Mood and affect  normal.     ED Results / Procedures / Treatments   Labs (all labs ordered are listed, but only abnormal results are displayed) Labs  Reviewed - No data to display  EKG None  Radiology DG Wrist Complete Right  Result Date: 06/16/2020 CLINICAL DATA:  Right wrist pain after fall EXAM: RIGHT WRIST - COMPLETE 3+ VIEW COMPARISON:  None. FINDINGS: Acute fracture of the distal radial metaphysis with mild impaction and slight dorsal angulation. No definite intra-articular extension to the radiocarpal joint is evident. Minimally displaced fracture through the tip of the ulnar styloid. Carpal bones and carpal intraosseous spaces are maintained. No dislocation. Soft tissue swelling at the wrist. IMPRESSION: 1. Acute fracture of the distal radial metaphysis with mild impaction and slight dorsal angulation. 2. Minimally displaced fracture through the tip of the ulnar styloid. Electronically Signed   By: Davina Poke D.O.   On: 06/16/2020 16:39    Procedures .Splint Application  Date/Time: 06/16/2020 6:42 PM Performed by: Nettie Elm, PA-C Authorized by: Nettie Elm, PA-C   Consent:    Consent obtained:  Verbal   Consent given by:  Patient   Risks, benefits, and alternatives were discussed: yes     Risks discussed:  Discoloration, numbness, pain and swelling   Alternatives discussed:  Observation, alternative treatment, delayed treatment, referral and no treatment Universal protocol:    Procedure explained and questions answered to patient or proxy's satisfaction: yes     Relevant documents present and verified: yes     Test results available: yes     Imaging studies available: yes     Required blood products, implants, devices, and special equipment available: yes     Site/side marked: yes     Immediately prior to procedure a time out was called: yes     Patient identity confirmed:  Verbally with patient Pre-procedure details:    Distal neurologic exam:  Normal   Distal  perfusion: distal pulses strong and brisk capillary refill   Procedure details:    Location:  Wrist   Wrist location:  R wrist   Strapping: no     Cast type:  Short arm   Splint type:  Sugar tong   Supplies:  Sling, elastic bandage, fiberglass, cotton padding and aluminum splint   Splint applied and adjusted personally by me: Applied by Ortho tech, adjusted by me.   Post-procedure details:    Distal neurologic exam:  Normal   Distal perfusion: distal pulses strong and brisk capillary refill     Procedure completion:  Tolerated well, no immediate complications   Post-procedure imaging: not applicable     (including critical care time)  Medications Ordered in ED Medications  HYDROcodone-acetaminophen (NORCO/VICODIN) 5-325 MG per tablet 1 tablet (1 tablet Oral Given 06/16/20 1835)    ED Course  I have reviewed the triage vital signs and the nursing notes.  Pertinent labs & imaging results that were available during my care of the patient were reviewed by me and considered in my medical decision making (see chart for details).  60 year old presents for evaluation of right wrist pain after fall on outstretched hand.  She denies hitting her head, LOC or any coagulation.  She is been ambulatory since the incident.  She denies any paresthesias or weakness.  She does have some soft tissue swelling and tenderness to her right distal radius and ulna.  Neurovascularly intact.  No midshaft, proximal radius or ulna pain.  X-ray obtained from triage which I personally viewed interpreted which shows distal radius metaphyseal fracture with mild impaction and slight dorsal angulation.  There is also a minimally displaced fracture through the tip of the  ulnar styloid.  Patient placed in sugar tong splint, sling.  Discussed close follow-up with hand surgery.  She is agreeable to this.  Patient neurovascularly intact after splint placement  The patient has been appropriately medically screened and/or  stabilized in the ED. I have low suspicion for any other emergent medical condition which would require further screening, evaluation or treatment in the ED or require inpatient management.  Patient is hemodynamically stable and in no acute distress.  Patient able to ambulate in department prior to ED.  Evaluation does not show acute pathology that would require ongoing or additional emergent interventions while in the emergency department or further inpatient treatment.  I have discussed the diagnosis with the patient and answered all questions.  Pain is been managed while in the emergency department and patient has no further complaints prior to discharge.  Patient is comfortable with plan discussed in room and is stable for discharge at this time.  I have discussed strict return precautions for returning to the emergency department.  Patient was encouraged to follow-up with PCP/specialist refer to at discharge.       MDM Rules/Calculators/A&P                           Final Clinical Impression(s) / ED Diagnoses Final diagnoses:  Other closed extra-articular fracture of distal end of right radius, initial encounter  Fall, initial encounter    Rx / DC Orders ED Discharge Orders         Ordered    HYDROcodone-acetaminophen (NORCO/VICODIN) 5-325 MG tablet  Every 4 hours PRN        06/16/20 1849           Braylee Bosher A, PA-C 06/16/20 1957    Lucrezia Starch, MD 06/18/20 2326

## 2020-06-16 NOTE — ED Notes (Signed)
Ortho tech bedside 

## 2020-06-16 NOTE — Discharge Instructions (Signed)
Take the pain medicine as prescribed.  This medication may become addictive.  Only take as needed.  May also take additional medications such as Motrin for pain.  Make sure to ice and elevate your arm.  Please follow-up with hand surgery.  His contact number is listed in your discharge paperwork.  I would recommend calling next week to schedule an appointment.  Do not get the splint wet.  If you notice additional pain, numbness or tingling, changes to the colors in your fingers or your arms please seek reevaluation the emergency department

## 2020-06-16 NOTE — ED Notes (Signed)
ED Provider at bedside. 

## 2020-06-16 NOTE — ED Triage Notes (Signed)
Pt complains of right wrist pain since falling off a hoverboard today. She heard a crack in her right wrist when she fell.

## 2020-06-16 NOTE — ED Notes (Signed)
An After Visit Summary was printed and given to the patient. Discharge instructions given and no further questions at this time.  

## 2020-06-19 DIAGNOSIS — S52521D Torus fracture of lower end of right radius, subsequent encounter for fracture with routine healing: Secondary | ICD-10-CM | POA: Diagnosis not present

## 2020-06-22 DIAGNOSIS — S52521A Torus fracture of lower end of right radius, initial encounter for closed fracture: Secondary | ICD-10-CM | POA: Diagnosis not present

## 2020-06-22 DIAGNOSIS — X58XXXA Exposure to other specified factors, initial encounter: Secondary | ICD-10-CM | POA: Diagnosis not present

## 2020-06-22 DIAGNOSIS — S52571A Other intraarticular fracture of lower end of right radius, initial encounter for closed fracture: Secondary | ICD-10-CM | POA: Diagnosis not present

## 2020-06-22 DIAGNOSIS — Y999 Unspecified external cause status: Secondary | ICD-10-CM | POA: Diagnosis not present

## 2020-06-22 DIAGNOSIS — G8918 Other acute postprocedural pain: Secondary | ICD-10-CM | POA: Diagnosis not present

## 2020-07-05 DIAGNOSIS — S52521D Torus fracture of lower end of right radius, subsequent encounter for fracture with routine healing: Secondary | ICD-10-CM | POA: Diagnosis not present

## 2020-07-16 ENCOUNTER — Other Ambulatory Visit: Payer: Self-pay | Admitting: Psychiatry

## 2020-07-16 DIAGNOSIS — F411 Generalized anxiety disorder: Secondary | ICD-10-CM

## 2020-07-16 DIAGNOSIS — F5105 Insomnia due to other mental disorder: Secondary | ICD-10-CM

## 2020-07-20 DIAGNOSIS — M25631 Stiffness of right wrist, not elsewhere classified: Secondary | ICD-10-CM | POA: Diagnosis not present

## 2020-07-20 DIAGNOSIS — S52521D Torus fracture of lower end of right radius, subsequent encounter for fracture with routine healing: Secondary | ICD-10-CM | POA: Diagnosis not present

## 2020-07-25 DIAGNOSIS — M25631 Stiffness of right wrist, not elsewhere classified: Secondary | ICD-10-CM | POA: Diagnosis not present

## 2020-07-27 DIAGNOSIS — M25631 Stiffness of right wrist, not elsewhere classified: Secondary | ICD-10-CM | POA: Diagnosis not present

## 2020-08-03 DIAGNOSIS — M25631 Stiffness of right wrist, not elsewhere classified: Secondary | ICD-10-CM | POA: Diagnosis not present

## 2020-08-10 DIAGNOSIS — M25631 Stiffness of right wrist, not elsewhere classified: Secondary | ICD-10-CM | POA: Diagnosis not present

## 2020-08-17 DIAGNOSIS — S52521D Torus fracture of lower end of right radius, subsequent encounter for fracture with routine healing: Secondary | ICD-10-CM | POA: Diagnosis not present

## 2020-08-17 DIAGNOSIS — M25631 Stiffness of right wrist, not elsewhere classified: Secondary | ICD-10-CM | POA: Diagnosis not present

## 2020-08-24 ENCOUNTER — Other Ambulatory Visit: Payer: Self-pay | Admitting: Psychiatry

## 2020-08-24 DIAGNOSIS — M25631 Stiffness of right wrist, not elsewhere classified: Secondary | ICD-10-CM | POA: Diagnosis not present

## 2020-08-24 DIAGNOSIS — F3342 Major depressive disorder, recurrent, in full remission: Secondary | ICD-10-CM

## 2020-08-24 NOTE — Telephone Encounter (Signed)
Just so I know, did you tell admin staff to call and RS her?

## 2020-08-24 NOTE — Telephone Encounter (Signed)
Check on refill

## 2020-08-24 NOTE — Telephone Encounter (Signed)
Yes and left pharmacy note

## 2020-08-24 NOTE — Telephone Encounter (Signed)
Has an appt on 08/29/20.

## 2020-08-24 NOTE — Telephone Encounter (Signed)
Please review

## 2020-08-24 NOTE — Telephone Encounter (Signed)
Please call to schedule appt.

## 2020-08-29 ENCOUNTER — Encounter: Payer: Self-pay | Admitting: Psychiatry

## 2020-08-29 ENCOUNTER — Other Ambulatory Visit: Payer: Self-pay

## 2020-08-29 ENCOUNTER — Ambulatory Visit (INDEPENDENT_AMBULATORY_CARE_PROVIDER_SITE_OTHER): Payer: BC Managed Care – PPO | Admitting: Psychiatry

## 2020-08-29 DIAGNOSIS — F331 Major depressive disorder, recurrent, moderate: Secondary | ICD-10-CM | POA: Diagnosis not present

## 2020-08-29 DIAGNOSIS — F5105 Insomnia due to other mental disorder: Secondary | ICD-10-CM

## 2020-08-29 DIAGNOSIS — F3342 Major depressive disorder, recurrent, in full remission: Secondary | ICD-10-CM

## 2020-08-29 DIAGNOSIS — F411 Generalized anxiety disorder: Secondary | ICD-10-CM | POA: Diagnosis not present

## 2020-08-29 MED ORDER — CLONAZEPAM 1 MG PO TABS: 1.0000 mg | ORAL_TABLET | Freq: Every day | ORAL | 0 refills | Status: DC

## 2020-08-29 MED ORDER — BUPROPION HCL ER (XL) 300 MG PO TB24: 300.0000 mg | ORAL_TABLET | Freq: Every day | ORAL | 0 refills | Status: DC

## 2020-08-29 NOTE — Progress Notes (Signed)
[name] [id number] 08/22/1960 59 y.o.  Subjective:   Patient ID:  [name] is a 59 y.o. (DOB 10/07/1960) female.  Chief Complaint:  Chief Complaint  Patient presents with  . Follow-up  . Generalized anxiety disorder  . 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 Stress  . Sleeping Problem    Depression        Associated symptoms include no decreased concentration and no suicidal ideas.  Past medical history includes anxiety.   Anxiety Patient reports no confusion, decreased concentration, nervous/anxious behavior or suicidal ideas.     [name] presents to the office today for follow-up of depression and ADD.  Last seen August 23, 2018.  She had reduced the Abilify to 5 mg and was doing well.  She wanted to try to taper off of that medication.   visit June 2020.  Reduced abilify to 2 mg daily without any problems.   visit October 2020.  She was still doing well and she wanted to try discontinuing the Abilify which was reasonable so it was stopped.  January 2021 appointment with the following noted: No meds were changed. Healthy.  No problem off the Abilify.  Depression is under control.  Very stressful last 2 weeks which created some anxiety.  Still working long hours. Stopped Vyvanse bc didn't feel she needed it any longer about 3 weeks ago.  Tremor resolved.  No worse depression.  Other stressors have occurred related to the marriage.  Anxiety is still manageable.  Sleep 7 hours until worse last 2 weeks with work demands.  Sometimes goes to bed to late.  Work from home and a lot. 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  Mother living with her until July.   Sleep OK usually.  Diazepam mainly used for sleep and it helps with Flexeril. Wants to eventually try reducing Wellbutrin to see if tremor is better.  Dating and feels better.  January 04, 2020 appointment with the following noted: Still doing fine with mood.  Both depression and  anxiety managed.  Wants to reduce Wellbutrin to 150 mg daily. Diazepam about once weekly for sleep.  Usually stress work related. No med changes  08/29/2020 appointment with the following noted: More depression when tapered off Wellbutrin.  More sadness and resolved back on the Wellbutrin and not depressed now.   Started vitamin B6 which looked Wellbutrin and not sure if she accidentally missed Wellbutrin for a week.  Will restart it. Managing chronic work stress. Not sleeping enough and doesn't think Valium 15 mg for 2 weeks is helping with EMA 4 AM with 7 hours good night.  Time change made it worse.  No SE.  Normal sleep 8 hours. Typical is 6 hours.  Work 8-10 hours daily. Down to 1 cup coffee am.  Past Psychiatric Medication Trials: Temazepam, clonazepam, Ambien no resp, Flexeril, diazepam 15 Lost resp, remote trazodone NR Abilify 10,  lithium, lamotrigine,  bupropion 450 tremor, buspirone 30 twice daily, venlafaxine hypomania, fluoxetine,  Adderall, Vyvanse, [place],   Review of Systems:  Review of Systems  Constitutional: Positive for unexpected weight change.  Musculoskeletal: Positive for arthralgias.  Neurological: Negative for tremors and weakness.  Psychiatric/Behavioral: Positive for depression and sleep disturbance. Negative for agitation, behavioral problems, confusion, decreased concentration, dysphoric mood, hallucinations, self-injury and suicidal ideas. The patient is not nervous/anxious and is not hyperactive.     Medications: I have reviewed the patient's current medications.  Current Outpatient Medications    Medication Sig Dispense Refill  . Calcium-Vitamin D-Vitamin K (CHEWABLE CALCIUM PO) Take by mouth.    . Cholecalciferol (VITAMIN D3 GUMMIES ADULT PO) Take by mouth.    . clonazePAM (KLONOPIN) 1 MG tablet Take 1 tablet (1 mg total) by mouth at bedtime. 30 tablet 0  . Multiple Vitamin (MULTIVITAMIN) tablet Take 1 tablet by mouth daily.    Marland Kitchen buPROPion (WELLBUTRIN  XL) 300 MG 24 hr tablet Take 1 tablet (300 mg total) by mouth daily. 90 tablet 0  . HYDROcodone-acetaminophen (NORCO/VICODIN) 5-325 MG tablet Take 1 tablet by mouth every 4 (four) hours as needed. (Patient not taking: Reported on 08/29/2020) 10 tablet 0   No current facility-administered medications for this visit.    Medication Side Effects: None  Allergies:  Allergies  Allergen Reactions  . Morphine And Related Itching    Past Medical History:  Diagnosis Date  . Anxiety   . Asthma    as a child, early adult...none now  . BRCA negative 06/2013   BRCA I & II negative  . Breast cancer ([place]) 10/28/12   right breast  . Colonic mass 07/28/2011   ascending colon and found a polyp 2.5 cm adenoma - hemicoloectomy  . Depression   . GERD (gastroesophageal reflux disease)    occas  at  night....twice a  month--otc meds  . Recurrent upper respiratory infection (URI)    started 1/28.....getting better  . S/P chemotherapy, time since greater than 12 weeks     Family History  Problem Relation Age of Onset  . Heart disease Father   . Cancer Paternal Aunt        unknown form of cancer  . Lung cancer Maternal Grandmother   . Testicular cancer Cousin        paternal cousin; died in 78s  . Pancreatic cancer Cousin 42  . Colon cancer Other        paternal grandmother's sister  . Cancer Other        MGM's sister    Social History   Socioeconomic History  . Marital status: Married    Spouse name: [name]  . Number of children: 2  . Years of education: Not on file  . Highest education level: Not on file  Occupational History    Employer: [employer]  Tobacco Use  . Smoking status: Never Smoker  . Smokeless tobacco: Never Used  Substance and Sexual Activity  . Alcohol use: Yes    Comment: wine occasionally  . Drug use: No  . Sexual activity: Yes    Birth control/protection: Surgical  Other Topics Concern  . Not on file  Social History Narrative  . Not on file   Social Determinants  of Health   Financial Resource Strain: Not on file  Food Insecurity: Not on file  Transportation Needs: Not on file  Physical Activity: Not on file  Stress: Not on file  Social Connections: Not on file  Intimate Partner Violence: Not on file    Past Medical History, Surgical history, Social history, and Family history were reviewed and updated as appropriate.   Please see review of systems for further details on the patient's review from today.   Objective:   Physical Exam:  There were no vitals taken for this visit.  Physical Exam Constitutional:      General: She is not in acute distress.    Appearance: She is well-developed.  Musculoskeletal:        General: No deformity.  Neurological:  Mental Status: She is alert and oriented to person, place, and time.     Motor: No tremor.     Coordination: Coordination normal.     Gait: Gait normal.  Psychiatric:        Attention and Perception: She is attentive.        Mood and Affect: Mood is not anxious or depressed. Affect is not labile, blunt, angry or inappropriate.        Speech: Speech normal.        Behavior: Behavior normal.        Thought Content: Thought content normal. Thought content does not include homicidal or suicidal ideation. Thought content does not include homicidal or suicidal plan.        Cognition and Memory: Cognition normal.        Judgment: Judgment normal.     Comments: Insight intact. No auditory or visual hallucinations. No delusions.      Lab Review:     Component Value Date/Time   NA 141 03/08/2020 0835   NA 139 02/09/2017 0911   K 4.2 03/08/2020 0835   K 4.0 02/09/2017 0911   CL 104 03/08/2020 0835   CL 102 11/03/2012 0821   CO2 28 03/08/2020 0835   CO2 26 02/09/2017 0911   GLUCOSE 79 03/08/2020 0835   GLUCOSE 104 02/09/2017 0911   GLUCOSE 93 11/03/2012 0821   BUN 16 03/08/2020 0835   BUN 15.5 02/09/2017 0911   CREATININE 1.06 (H) 03/08/2020 0835   CREATININE 0.9 02/09/2017 0911    CALCIUM 9.7 03/08/2020 0835   CALCIUM 9.9 02/09/2017 0911   PROT 7.5 03/08/2020 0835   PROT 7.4 02/09/2017 0911   ALBUMIN 4.0 03/08/2020 0835   ALBUMIN 4.1 02/09/2017 0911   AST 17 03/08/2020 0835   AST 17 02/09/2017 0911   ALT 16 03/08/2020 0835   ALT 14 02/09/2017 0911   ALKPHOS 54 03/08/2020 0835   ALKPHOS 60 02/09/2017 0911   BILITOT 0.5 03/08/2020 0835   BILITOT 0.45 02/09/2017 0911   GFRNONAA 57 (L) 03/08/2020 0835   GFRAA >60 03/08/2020 0835       Component Value Date/Time   WBC 5.8 03/08/2020 0835   WBC 6.7 02/24/2019 0939   RBC 4.71 03/08/2020 0835   HGB 13.6 03/08/2020 0835   HGB 12.6 02/09/2017 0911   HCT 41.7 03/08/2020 0835   HCT 38.8 02/09/2017 0911   PLT 290 03/08/2020 0835   PLT 295 02/09/2017 0911   MCV 88.5 03/08/2020 0835   MCV 87.4 02/09/2017 0911   MCH 28.9 03/08/2020 0835   MCHC 32.6 03/08/2020 0835   RDW 13.0 03/08/2020 0835   RDW 14.2 02/09/2017 0911   LYMPHSABS 2.0 03/08/2020 0835   LYMPHSABS 1.6 02/09/2017 0911   MONOABS 0.4 03/08/2020 0835   MONOABS 0.5 02/09/2017 0911   EOSABS 0.3 03/08/2020 0835   EOSABS 0.1 02/09/2017 0911   BASOSABS 0.1 03/08/2020 0835   BASOSABS 0.1 02/09/2017 0911    No results found for: POCLITH, LITHIUM   No results found for: PHENYTOIN, PHENOBARB, VALPROATE, CBMZ   .res Assessment: Plan:    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  She wondered about reducing some of the  medication.  She is tolerated the reduction Wellbutrin fine and her tremor is markedly better with a lower dose.    Tried reduce Wellbutrin to 150 mg daily for 1 months and she stop it.  Felt worse and restarted  it.  Call if there is any recurrence of depression.  Disc timing of antidepressants and effects and SE.  Sleep is worse with a lot of stress going on.  We discussed the short-term risks associated with benzodiazepines including sedation and increased fall risk among others.  Discussed long-term side effect risk including dependence, potential withdrawal symptoms, and the potential eventual dose-related risk of dementia.  But recent studies from 2020 dispute this association between benzodiazepines and dementia risk. Newer studies in 2020 do not support an association with dementia. Trial switch back to clonazepam1 mg HS for EMA  She agrees to the plan  Follow-up  2-3 mos DT sleep issues   Cottle MD, DFAPA   Please see After Visit Summary for patient specific instructions.  Future Appointments  Date Time Provider Department Center  03/12/2021  8:30 AM [facility] LAB [facility] None  03/12/2021  9:00 AM Magrinat, Gustav C, MD [facility] None  03/12/2021  9:30 AM [facility] INFUSION [facility] None    No orders of the defined types were placed in this encounter.     ------------------------------- 

## 2020-09-07 DIAGNOSIS — M25631 Stiffness of right wrist, not elsewhere classified: Secondary | ICD-10-CM | POA: Diagnosis not present

## 2020-09-10 DIAGNOSIS — Z0000 Encounter for general adult medical examination without abnormal findings: Secondary | ICD-10-CM | POA: Diagnosis not present

## 2020-09-10 DIAGNOSIS — E7800 Pure hypercholesterolemia, unspecified: Secondary | ICD-10-CM | POA: Diagnosis not present

## 2020-09-10 DIAGNOSIS — Z23 Encounter for immunization: Secondary | ICD-10-CM | POA: Diagnosis not present

## 2020-09-14 DIAGNOSIS — S52521D Torus fracture of lower end of right radius, subsequent encounter for fracture with routine healing: Secondary | ICD-10-CM | POA: Diagnosis not present

## 2020-09-22 ENCOUNTER — Other Ambulatory Visit: Payer: Self-pay | Admitting: Psychiatry

## 2020-09-22 DIAGNOSIS — F3342 Major depressive disorder, recurrent, in full remission: Secondary | ICD-10-CM

## 2020-10-03 ENCOUNTER — Encounter (HOSPITAL_COMMUNITY): Payer: Self-pay

## 2020-10-03 ENCOUNTER — Other Ambulatory Visit: Payer: Self-pay

## 2020-10-03 ENCOUNTER — Ambulatory Visit (INDEPENDENT_AMBULATORY_CARE_PROVIDER_SITE_OTHER): Payer: BC Managed Care – PPO

## 2020-10-03 ENCOUNTER — Ambulatory Visit (HOSPITAL_COMMUNITY)
Admission: RE | Admit: 2020-10-03 | Discharge: 2020-10-03 | Disposition: A | Discharge status: Home / Self Care | Payer: BC Managed Care – PPO | Source: Ambulatory Visit | Attending: Internal Medicine | Admitting: Internal Medicine

## 2020-10-03 VITALS — BP 127/75 | HR 66 | Temp 99.6°F | Resp 18

## 2020-10-03 DIAGNOSIS — W19XXXA Unspecified fall, initial encounter: Secondary | ICD-10-CM

## 2020-10-03 DIAGNOSIS — S81011A Laceration without foreign body, right knee, initial encounter: Secondary | ICD-10-CM | POA: Diagnosis not present

## 2020-10-03 DIAGNOSIS — M25561 Pain in right knee: Secondary | ICD-10-CM | POA: Diagnosis not present

## 2020-10-03 DIAGNOSIS — M1711 Unilateral primary osteoarthritis, right knee: Secondary | ICD-10-CM | POA: Diagnosis not present

## 2020-10-03 MED ORDER — LIDOCAINE-EPINEPHRINE 1 %-1:100000 IJ SOLN
INTRAMUSCULAR | Status: AC
  Filled 2020-10-03: qty 1

## 2020-10-03 NOTE — ED Triage Notes (Signed)
Pt reports she mis stepped on out side steps this AM and fell on RT knee. Pt presented with ACE wrap and instant ice pack to RT knee. Bleeding controlled on arrival to room . Pt reports she is up to date on tetanus .

## 2020-10-03 NOTE — ED Provider Notes (Signed)
[place]   MRN: [id number] DOB: 03/27/1961  Subjective:   [name] is a 60 y.o. female presenting for suffering a right knee injury today.  Patient states that she was going down some steps and accidentally missed 1, forcing her to fall and break her fall with the right knee.  She suffered some scrapes and laceration.  States that she cleaned her wound thoroughly at home, but antibiotic ointment and wrapped her knee before coming in.  She had her tetanus updated a month ago at her PCPs office.  Denies loss consciousness, head injury, confusion, dizziness.  The fall was purely accidental.  No current facility-administered medications for this encounter.  Current Outpatient Medications:  .  buPROPion (WELLBUTRIN XL) 300 MG 24 hr tablet, Take 1 tablet (300 mg total) by mouth daily., Disp: 90 tablet, Rfl: 0 .  Calcium-Vitamin D-Vitamin K (CHEWABLE CALCIUM PO), Take by mouth., Disp: , Rfl:  .  Cholecalciferol (VITAMIN D3 GUMMIES ADULT PO), Take by mouth., Disp: , Rfl:  .  clonazePAM (KLONOPIN) 1 MG tablet, Take 1 tablet (1 mg total) by mouth at bedtime., Disp: 30 tablet, Rfl: 0 .  HYDROcodone-acetaminophen (NORCO/VICODIN) 5-325 MG tablet, Take 1 tablet by mouth every 4 (four) hours as needed. (Patient not taking: Reported on 08/29/2020), Disp: 10 tablet, Rfl: 0 .  Multiple Vitamin (MULTIVITAMIN) tablet, Take 1 tablet by mouth daily., Disp: , Rfl:    Allergies  Allergen Reactions  . Morphine And Related Itching    Past Medical History:  Diagnosis Date  . Anxiety   . Asthma    as a child, early adult...none now  . BRCA negative 06/2013   BRCA I & II negative  . Breast cancer ([place]) 10/28/12   right breast  . Colonic mass 07/28/2011   ascending colon and found a polyp 2.5 cm adenoma - hemicoloectomy  . Depression   . GERD (gastroesophageal reflux disease)    occas  at  night....twice a  month--otc meds  . Recurrent upper respiratory infection (URI)    started  1/28.....getting better  . S/P chemotherapy, time since greater than 12 weeks      Past Surgical History:  Procedure Laterality Date  . APPENDECTOMY    . BREAST BIOPSY Right 10/28/12   Invasive mammary ca, ER/PR=+, Her2 Neu-  . ENDOMETRIAL ABLATION  2006   HTA  . HEMICOLECTOMY  07/28/11  . TUBAL LIGATION  2006    Family History  Problem Relation Age of Onset  . Heart disease Father   . Cancer Paternal Aunt        unknown form of cancer  . Lung cancer Maternal Grandmother   . Testicular cancer Cousin        paternal cousin; died in 63s  . Pancreatic cancer Cousin 83  . Colon cancer Other        paternal grandmother's sister  . Cancer Other        MGM's sister    Social History   Tobacco Use  . Smoking status: Never Smoker  . Smokeless tobacco: Never Used  Substance Use Topics  . Alcohol use: Yes    Comment: wine occasionally  . Drug use: No    ROS   Objective:   Vitals: BP 127/75 (BP Location: Right Arm)   Pulse 66   Temp 99.6 F (37.6 C) (Oral)   Resp 18   SpO2 99%   Physical Exam Constitutional:      General: She  is not in acute distress.    Appearance: Normal appearance. She is well-developed. She is not ill-appearing, toxic-appearing or diaphoretic.  HENT:     Head: Normocephalic and atraumatic.     Nose: Nose normal.     Mouth/Throat:     Mouth: Mucous membranes are moist.     Pharynx: Oropharynx is clear.  Eyes:     General: No scleral icterus.       Right eye: No discharge.        Left eye: No discharge.     Extraocular Movements: Extraocular movements intact.     Conjunctiva/sclera: Conjunctivae normal.     Pupils: Pupils are equal, round, and reactive to light.  Cardiovascular:     Rate and Rhythm: Normal rate.  Pulmonary:     Effort: Pulmonary effort is normal.  Musculoskeletal:       Legs:  Skin:    General: Skin is warm and dry.  Neurological:     General: No focal deficit present.     Mental Status: She is alert and oriented  to person, place, and time.     Motor: No weakness.     Coordination: Coordination normal.     Gait: Gait normal.     Deep Tendon Reflexes: Reflexes normal.  Psychiatric:        Mood and Affect: Mood normal.        Behavior: Behavior normal.        Thought Content: Thought content normal.        Judgment: Judgment normal.     DG Knee Complete 4 Views Right  Result Date: 10/03/2020 CLINICAL DATA:  Fall with laceration. EXAM: RIGHT KNEE - COMPLETE 4+ VIEW COMPARISON:  No recent. FINDINGS: No acute bony or joint abnormality. No evidence of fracture or dislocation. Mild patellofemoral and medial compartment degenerative change. IMPRESSION: Mild patellofemoral and medial compartment degenerative change. No acute abnormality. Electronically Signed   By: Marcello Moores  Register   On: 10/03/2020 10:32   PROCEDURE NOTE: laceration repair Verbal consent obtained from patient.  Local anesthesia with 6cc Lidocaine 1% with epinephrine.  Wound explored for tendon, ligament damage. Wound scrubbed with soap and water and rinsed. Wound closed with #5 4-0 Prolene (simple interrupted) sutures.  Wound cleansed and dressed.   Assessment and Plan :   PDMP not reviewed this encounter.  1. Acute pain of right knee   2. Laceration of right knee, initial encounter   3. Accidental fall, initial encounter     Laceration repaired successfully. Wound care reviewed. Recommended Tylenol and/or ibuprofen for pain control. Return-to-clinic precautions discussed, patient verbalized understanding. Otherwise, follow up in 7-10 days for suture removal. Counseled patient on potential for adverse effects with medications prescribed/recommended today, ER and return-to-clinic precautions discussed, patient verbalized understanding.     Jaynee Eagles, PA-C 10/03/20 1036

## 2020-10-03 NOTE — Discharge Instructions (Signed)

## 2020-10-06 ENCOUNTER — Other Ambulatory Visit: Payer: Self-pay | Admitting: Psychiatry

## 2020-10-06 DIAGNOSIS — F5105 Insomnia due to other mental disorder: Secondary | ICD-10-CM

## 2020-10-08 NOTE — Telephone Encounter (Signed)
Last filled 08/29/20 no refills.appt on 10/29/20

## 2020-10-17 ENCOUNTER — Other Ambulatory Visit: Payer: Self-pay

## 2020-10-17 ENCOUNTER — Ambulatory Visit (HOSPITAL_COMMUNITY)
Admission: RE | Admit: 2020-10-17 | Discharge: 2020-10-17 | Disposition: A | Discharge status: Home / Self Care | Payer: BC Managed Care – PPO | Source: Ambulatory Visit | Attending: Family Medicine | Admitting: Family Medicine

## 2020-10-17 ENCOUNTER — Encounter (HOSPITAL_COMMUNITY): Payer: Self-pay

## 2020-10-17 DIAGNOSIS — Z4802 Encounter for removal of sutures: Secondary | ICD-10-CM

## 2020-10-17 NOTE — ED Triage Notes (Signed)
Pt presents for suture removal that was placed on 10/03/20. Denies any pain or signs of infection to site.

## 2020-10-29 ENCOUNTER — Ambulatory Visit (INDEPENDENT_AMBULATORY_CARE_PROVIDER_SITE_OTHER): Payer: BC Managed Care – PPO | Admitting: Psychiatry

## 2020-10-29 ENCOUNTER — Encounter: Payer: Self-pay | Admitting: Psychiatry

## 2020-10-29 ENCOUNTER — Other Ambulatory Visit: Payer: Self-pay

## 2020-10-29 DIAGNOSIS — F411 Generalized anxiety disorder: Secondary | ICD-10-CM | POA: Diagnosis not present

## 2020-10-29 DIAGNOSIS — F3342 Major depressive disorder, recurrent, in full remission: Secondary | ICD-10-CM | POA: Diagnosis not present

## 2020-10-29 DIAGNOSIS — F5105 Insomnia due to other mental disorder: Secondary | ICD-10-CM | POA: Diagnosis not present

## 2020-10-29 MED ORDER — BUPROPION HCL ER (XL) 300 MG PO TB24: 300.0000 mg | ORAL_TABLET | Freq: Every day | ORAL | 1 refills | Status: DC

## 2020-10-29 MED ORDER — CLONAZEPAM 1 MG PO TABS: 1.0000 mg | ORAL_TABLET | Freq: Every day | ORAL | 1 refills | Status: DC

## 2020-10-29 NOTE — Progress Notes (Signed)
[name] [id number] 1960/07/27 60 y.o.  Subjective:   Patient ID:  [name] is a 60 y.o. (DOB 01-Feb-1961) female.  Chief Complaint:  Chief Complaint  Patient presents with  . Follow-up  . 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 Sleeping Problem    Depression        Associated symptoms include no decreased concentration and no suicidal ideas.  Past medical history includes anxiety.   Anxiety Patient reports no confusion, decreased concentration, nervous/anxious behavior or suicidal ideas.     [name] presents to the office today for follow-up of depression and ADD.  Last seen August 23, 2018.  She had reduced the Abilify to 5 mg and was doing well.  She wanted to try to taper off of that medication.   visit June 2020.  Reduced abilify to 2 mg daily without any problems.   visit October 2020.  She was still doing well and she wanted to try discontinuing the Abilify which was reasonable so it was stopped.  January 2021 appointment with the following noted: No meds were changed. Healthy.  No problem off the Abilify.  Depression is under control.  Very stressful last 2 weeks which created some anxiety.  Still working long hours. Stopped Vyvanse bc didn't feel she needed it any longer about 3 weeks ago.  Tremor resolved.  No worse depression.  Other stressors have occurred related to the marriage.  Anxiety is still manageable.  Sleep 7 hours until worse last 2 weeks with work demands.  Sometimes goes to bed to late.  Work from home and a lot. 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  Mother living with her until July.   Sleep OK usually.  Diazepam mainly used for sleep and it helps with Flexeril. Wants to eventually try reducing Wellbutrin to see if tremor is better.  Dating and feels better.  January 04, 2020 appointment with the following noted: Still doing fine with mood.  Both depression and anxiety managed.  Wants to reduce Wellbutrin to  150 mg daily. Diazepam about once weekly for sleep.  Usually stress work related. No med changes  08/29/2020 appointment with the following noted: More depression when tapered off Wellbutrin.  More sadness and resolved back on the Wellbutrin and not depressed now.   Started vitamin B6 which looked Wellbutrin and not sure if she accidentally missed Wellbutrin for a week.  Will restart it. Managing chronic work stress. Not sleeping enough and doesn't think Valium 15 mg for 2 weeks is helping with EMA 4 AM with 7 hours good night.  Time change made it worse.  No SE.  Normal sleep 8 hours. Typical is 6 hours.  Work 8-10 hours daily. Down to 1 cup coffee am. Plan: Trial switch back to clonazepam1 mg HS for EMA  10/29/20 appt noted: Awaking 1-2 times per week but less EMA than before.  Tolerating it fine.  No handogover. Less tired daytime. 6-7 hours of sleep. Patient reports stable mood and denies depressed or irritable moods.  Patient denies any recent difficulty with anxiety.  Not anxious for awhile.. Denies appetite disturbance.  Patient reports that energy and motivation have been good.  Patient denies any difficulty with concentration.  Patient denies any suicidal ideation.   Past Psychiatric Medication Trials: Temazepam, clonazepam, Ambien no resp, Flexeril, diazepam 15 Lost resp, remote trazodone NR Abilify 10,  lithium, lamotrigine,  bupropion 450 tremor, buspirone 30 twice daily, venlafaxine hypomania, fluoxetine,  Adderall, Vyvanse, [place],   Review of Systems:  Review of Systems  Constitutional: Positive for unexpected weight change.  Musculoskeletal: Positive for arthralgias.  Neurological: Negative for tremors and weakness.  Psychiatric/Behavioral: Positive for depression and sleep disturbance. Negative for agitation, behavioral problems, confusion, decreased concentration, dysphoric mood, hallucinations, self-injury and suicidal ideas. The patient is not nervous/anxious and is not  hyperactive.     Medications: I have reviewed the patient's current medications.  Current Outpatient Medications  Medication Sig Dispense Refill  . Calcium-Vitamin D-Vitamin K (CHEWABLE CALCIUM PO) Take by mouth.    . Cholecalciferol (VITAMIN D3 GUMMIES ADULT PO) Take by mouth.    . Multiple Vitamin (MULTIVITAMIN) tablet Take 1 tablet by mouth daily.    Marland Kitchen buPROPion (WELLBUTRIN XL) 300 MG 24 hr tablet Take 1 tablet (300 mg total) by mouth daily. 90 tablet 1  . clonazePAM (KLONOPIN) 1 MG tablet Take 1 tablet (1 mg total) by mouth at bedtime. 90 tablet 1  . HYDROcodone-acetaminophen (NORCO/VICODIN) 5-325 MG tablet Take 1 tablet by mouth every 4 (four) hours as needed. (Patient not taking: No sig reported) 10 tablet 0   No current facility-administered medications for this visit.    Medication Side Effects: None  Allergies:  Allergies  Allergen Reactions  . Morphine And Related Itching    Past Medical History:  Diagnosis Date  . Anxiety   . Asthma    as a child, early adult...none now  . BRCA negative 06/2013   BRCA I & II negative  . Breast cancer ([place]) 10/28/12   right breast  . Colonic mass 07/28/2011   ascending colon and found a polyp 2.5 cm adenoma - hemicoloectomy  . Depression   . GERD (gastroesophageal reflux disease)    occas  at  night....twice a  month--otc meds  . Recurrent upper respiratory infection (URI)    started 1/28.....getting better  . S/P chemotherapy, time since greater than 12 weeks     Family History  Problem Relation Age of Onset  . Heart disease Father   . Cancer Paternal Aunt        unknown form of cancer  . Lung cancer Maternal Grandmother   . Testicular cancer Cousin        paternal cousin; died in 46s  . Pancreatic cancer Cousin 36  . Colon cancer Other        paternal grandmother's sister  . Cancer Other        MGM's sister    Social History   Socioeconomic History  . Marital status: Married    Spouse name: [name]  . Number of  children: 2  . Years of education: Not on file  . Highest education level: Not on file  Occupational History    Employer: [employer]  Tobacco Use  . Smoking status: Never Smoker  . Smokeless tobacco: Never Used  Substance and Sexual Activity  . Alcohol use: Yes    Comment: wine occasionally  . Drug use: No  . Sexual activity: Yes    Birth control/protection: Surgical  Other Topics Concern  . Not on file  Social History Narrative  . Not on file   Social Determinants of Health   Financial Resource Strain: Not on file  Food Insecurity: Not on file  Transportation Needs: Not on file  Physical Activity: Not on file  Stress: Not on file  Social Connections: Not on file  Intimate Partner Violence: Not on file    Past Medical History, Surgical history, Social  history, and Family history were reviewed and updated as appropriate.   Please see review of systems for further details on the patient's review from today.   Objective:   Physical Exam:  There were no vitals taken for this visit.  Physical Exam Constitutional:      General: She is not in acute distress.    Appearance: She is well-developed.  Musculoskeletal:        General: No deformity.  Neurological:     Mental Status: She is alert and oriented to person, place, and time.     Motor: No tremor.     Coordination: Coordination normal.     Gait: Gait normal.  Psychiatric:        Attention and Perception: She is attentive.        Mood and Affect: Mood is not anxious or depressed. Affect is not labile, blunt, angry or inappropriate.        Speech: Speech normal. Speech is not slurred.        Behavior: Behavior normal.        Thought Content: Thought content normal. Thought content does not include homicidal or suicidal ideation. Thought content does not include homicidal or suicidal plan.        Cognition and Memory: Cognition normal.        Judgment: Judgment normal.     Comments: Insight intact. No auditory or visual  hallucinations. No delusions.      Lab Review:     Component Value Date/Time   NA 141 03/08/2020 0835   NA 139 02/09/2017 0911   K 4.2 03/08/2020 0835   K 4.0 02/09/2017 0911   CL 104 03/08/2020 0835   CL 102 11/03/2012 0821   CO2 28 03/08/2020 0835   CO2 26 02/09/2017 0911   GLUCOSE 79 03/08/2020 0835   GLUCOSE 104 02/09/2017 0911   GLUCOSE 93 11/03/2012 0821   BUN 16 03/08/2020 0835   BUN 15.5 02/09/2017 0911   CREATININE 1.06 (H) 03/08/2020 0835   CREATININE 0.9 02/09/2017 0911   CALCIUM 9.7 03/08/2020 0835   CALCIUM 9.9 02/09/2017 0911   PROT 7.5 03/08/2020 0835   PROT 7.4 02/09/2017 0911   ALBUMIN 4.0 03/08/2020 0835   ALBUMIN 4.1 02/09/2017 0911   AST 17 03/08/2020 0835   AST 17 02/09/2017 0911   ALT 16 03/08/2020 0835   ALT 14 02/09/2017 0911   ALKPHOS 54 03/08/2020 0835   ALKPHOS 60 02/09/2017 0911   BILITOT 0.5 03/08/2020 0835   BILITOT 0.45 02/09/2017 0911   GFRNONAA 57 (L) 03/08/2020 0835   GFRAA >60 03/08/2020 0835       Component Value Date/Time   WBC 5.8 03/08/2020 0835   WBC 6.7 02/24/2019 0939   RBC 4.71 03/08/2020 0835   HGB 13.6 03/08/2020 0835   HGB 12.6 02/09/2017 0911   HCT 41.7 03/08/2020 0835   HCT 38.8 02/09/2017 0911   PLT 290 03/08/2020 0835   PLT 295 02/09/2017 0911   MCV 88.5 03/08/2020 0835   MCV 87.4 02/09/2017 0911   MCH 28.9 03/08/2020 0835   MCHC 32.6 03/08/2020 0835   RDW 13.0 03/08/2020 0835   RDW 14.2 02/09/2017 0911   LYMPHSABS 2.0 03/08/2020 0835   LYMPHSABS 1.6 02/09/2017 0911   MONOABS 0.4 03/08/2020 0835   MONOABS 0.5 02/09/2017 0911   EOSABS 0.3 03/08/2020 0835   EOSABS 0.1 02/09/2017 0911   BASOSABS 0.1 03/08/2020 0835   BASOSABS 0.1 02/09/2017 0911    No results  found for: POCLITH, LITHIUM   No results found for: PHENYTOIN, PHENOBARB, VALPROATE, CBMZ   .res Assessment: Plan:    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   Failed attempt to DC Wellbutrin..  Call if there is any recurrence of depression.  Disc timing of antidepressants and effects and SE.  Sleep hygiene discussed.   Sleep is worse with a lot of stress going on.  We discussed the short-term risks associated with benzodiazepines including sedation and increased fall risk among others.  Discussed long-term side effect risk including dependence, potential withdrawal symptoms, and the potential eventual dose-related risk of dementia.  But recent studies from 2020 dispute this association between benzodiazepines and dementia risk. Newer studies in 2020 do not support an association with dementia. clonazepam1 mg HS for EMA Option increase.  Sleep hygiene.  No changes  She agrees to the plan  Follow-up  6 mos  Lynder Parents MD, DFAPA   Please see After Visit Summary for patient specific instructions.  Future Appointments  Date Time Provider [place]  03/12/2021  8:30 AM [facility] None  03/12/2021  9:00 AM Magrinat, Virgie Dad, MD [facility] None  03/12/2021  9:30 AM [facility] INFUSION [facility] None    No orders of the defined types were placed in this encounter.     -------------------------------

## 2020-12-26 DIAGNOSIS — Z20822 Contact with and (suspected) exposure to covid-19: Secondary | ICD-10-CM | POA: Diagnosis not present

## 2021-01-09 DIAGNOSIS — F4322 Adjustment disorder with anxiety: Secondary | ICD-10-CM | POA: Diagnosis not present

## 2021-02-06 DIAGNOSIS — G4733 Obstructive sleep apnea (adult) (pediatric): Secondary | ICD-10-CM | POA: Diagnosis not present

## 2021-02-08 ENCOUNTER — Telehealth: Payer: Self-pay | Admitting: Psychiatry

## 2021-02-08 ENCOUNTER — Other Ambulatory Visit: Payer: Self-pay

## 2021-02-08 DIAGNOSIS — F3342 Major depressive disorder, recurrent, in full remission: Secondary | ICD-10-CM

## 2021-02-08 DIAGNOSIS — F5105 Insomnia due to other mental disorder: Secondary | ICD-10-CM

## 2021-02-08 MED ORDER — BUPROPION HCL ER (XL) 300 MG PO TB24: 300.0000 mg | ORAL_TABLET | Freq: Every day | ORAL | 0 refills | Status: DC

## 2021-02-08 MED ORDER — CLONAZEPAM 1 MG PO TABS: 1.0000 mg | ORAL_TABLET | Freq: Every day | ORAL | 0 refills | Status: DC

## 2021-02-08 NOTE — Telephone Encounter (Signed)
Pended.

## 2021-02-08 NOTE — Telephone Encounter (Signed)
Pt called and would like Rx for Bupropion and Clonazepam sent to [facility].  She is changing to this Pharmacy.

## 2021-02-22 DIAGNOSIS — Z1231 Encounter for screening mammogram for malignant neoplasm of breast: Secondary | ICD-10-CM | POA: Diagnosis not present

## 2021-03-04 ENCOUNTER — Other Ambulatory Visit: Payer: Self-pay

## 2021-03-04 DIAGNOSIS — C50311 Malignant neoplasm of lower-inner quadrant of right female breast: Secondary | ICD-10-CM

## 2021-03-04 DIAGNOSIS — Z170 Estrogen receptor positive status [ER+]: Secondary | ICD-10-CM

## 2021-03-04 NOTE — Progress Notes (Signed)
ID: [name] OB: May 12, 1961  MR#: [id number]  CSN#:[id number]  Patient Care Team: Carol Ada, MD as PCP - General (Family Medicine) Paralee Cancel, MD as Consulting Physician (Orthopedic Surgery) Aleicia Kenagy, Virgie Dad, MD as Consulting Physician (Oncology) Cottle, Billey Co., MD as Consulting Physician (Psychiatry) OTHER MD:   CHIEF COMPLAINT: Estrogen receptor positive breast cancer  CURRENT TREATMENT: zoledronate   INTERVAL HISTORY: [name] returns today for follow up of her estrogen receptor positive breast cancer.  She continues on Zoledronate yearly with her most recent dose 03/08/2020 and a dose due today.  Her most recent bone density screening from 07/29/2019 at [facility] showed a T-score of -3.0, which is minimally improved/stable.  Her most recent mammogram at [facility] was 02/22/2021 and continues to show breast density category D.  There was no evidence of malignancy.  She is scheduled for her next mammography at [facility] 03/07/2022  REVIEW OF SYSTEMS: [name] just finished a big project.  This kept her from exercising.  Before that she was going to the gym regularly.  She is now planning to go on a cruise with her husband to celebrate.  The family is doing very well.  She tolerates the zoledronate with no side effects that she is aware of.  A detailed review of systems was otherwise stable   COVID 19 VACCINATION STATUS: Pfizer    HISTORY OF PRESENT ILLNESS: From the original intake node:   [name] had a routine colonoscopy under Dr. Paulita Fujita approximately a year and a half ago, showing a right colon lesion which could not be removed intraoperatively. CT scans of the abdomen and pelvis 07/07/2011 were negative except for the question of a soft tissue lesion in the right colon, and particularly there was no adenopathy or evidence of metastatic disease. She underwent partial colectomy under Dr. Madilyn Hook 07/28/2011, and the pathology from that procedure ([id number]) showed a  3.2 cm tubular adenoma without high-grade dysplasia or malignancy. Margins were negative. 0 of 16 lymph nodes were involved.   And the patient has been working at weight loss and managed to lose approximately 25 pounds over the past year. She feels this is what allowed her to palpate a mass in her right breast earlier this month. (She had had negative mammographic screening 02/26/2012). On 10/27/2012 the patient underwent bilateral diagnostic mammography. The breasts are "extremely dense". Mammography did not show any new or worrisome finding. Right breast ultrasound however located an irregular hypoechoic mass in the area of the patient's palpable abnormality. It measured approximately 7 mm. Biopsy of this mass 10/28/2012 showed an invasive ductal carcinoma, grade 2, estrogen and progesterone receptor positive, with no HER-2 amplification, and an MIB-1 of 5%.   Bilateral breast MRI 11/01/2012 found a 1.3 cm irregular enhancing mass in the lower inner quadrant of the right breast, with no other masses of concern in either breast and no abnormal appearing lymph nodes.   The patient's subsequent history is as detailed below.   PAST MEDICAL HISTORY: Past Medical History:  Diagnosis Date   Anxiety    Asthma    as a child, early adult...none now   BRCA negative 06/2013   BRCA I & II negative   Breast cancer ([place]) 10/28/12   right breast   Colonic mass 07/28/2011   ascending colon and found a polyp 2.5 cm adenoma - hemicoloectomy   Depression    GERD (gastroesophageal reflux disease)    occas  at  night....twice a  month--otc meds   Recurrent  upper respiratory infection (URI)    started 1/28.....getting better   S/P chemotherapy, time since greater than 12 weeks      PAST SURGICAL HISTORY: Past Surgical History:  Procedure Laterality Date   APPENDECTOMY     BREAST BIOPSY Right 10/28/12   Invasive mammary ca, ER/PR=+, Her2 Neu-   ENDOMETRIAL ABLATION  2006   HTA   HEMICOLECTOMY  07/28/11    TUBAL LIGATION  2006    FAMILY HISTORY Family History  Problem Relation Age of Onset   Heart disease Father    Cancer Paternal Aunt        unknown form of cancer   Lung cancer Maternal Grandmother    Testicular cancer Cousin        paternal cousin; died in 24s   Pancreatic cancer Cousin 30   Colon cancer Other        paternal grandmother's sister   Cancer Other        MGM's sister   the patient's father died from congestive heart failure at the age of 70. The patient's mother is still living, in her early 87s. The patient has one brother, 4 sisters. There is no history of breast or ovarian cancer in the family to the patient's knowledge.   GYNECOLOGIC HISTORY:  Menarche age 34, first live birth age 8. The patient's less. It was September 2013 and the one before that was March 2013. She is not taking hormone replacement. She did take oral contraceptives for several years intermittently in the past. There were no complications.    SOCIAL HISTORY:  (Updated September 2021) [name] is originally from [place]. 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 She is not a counselor herself.  Her former husband, [name] (goes by "[name]") works as a Social worker for the [employer] as well as having his own clinic in [place]. He is originally from [place].  They divorced over 2019.  The patient married [name] on 03/10/2020.  She kept the [facility] name because as an immigrant it would cost her more than $1000 to change it.  Her new husband is an Chief Financial Officer working for [employer] and has 2 children from a prior marriage, both living in [place].  [name]'s children are [name], who lives in [place] and has a Oceanographer in counseling; and [name], who is working in Engineer, technical sales.  [name] has 5 biological grandchildren.   ADVANCED DIRECTIVES: In the absence of any documentation to the contrary, the patient's spouse is  their HCPOA.    HEALTH MAINTENANCE: Social History   Tobacco Use   Smoking status: Never   Smokeless tobacco: Never  Substance Use Topics   Alcohol use: Yes    Comment: wine occasionally   Drug use: No     Colonoscopy: 06/02/2011 / [facility]  PAP:  Bone density: 05/19/2017 at [facility] showed a T score of -3.1 osteoporosis.  Lipid panel:   Allergies  Allergen Reactions   Morphine And Related Itching   Current Outpatient Medications  Medication Sig Dispense Refill   buPROPion (WELLBUTRIN XL) 300 MG 24 hr tablet Take 1 tablet (300 mg total) by mouth daily. 90 tablet 0   Calcium-Vitamin D-Vitamin K (CHEWABLE CALCIUM PO) Take by mouth.     Cholecalciferol (VITAMIN D3 GUMMIES ADULT PO) Take by mouth.     clonazePAM (KLONOPIN) 1 MG tablet Take 1 tablet (1 mg total) by mouth at bedtime. 90 tablet 0   Multiple Vitamin (MULTIVITAMIN) tablet Take  1 tablet by mouth daily.     No current facility-administered medications for this visit.    OBJECTIVE: Middle-aged white Gonzalez who appears well Vitals:   03/05/21 1140  BP: 125/65  Pulse: 76  Resp: 16  Temp: 97.9 F (36.6 C)  SpO2: 100%      Body mass index is 26.98 kg/m.    ECOG FS: 1  Sclerae unicteric, EOMs intact Wearing a mask No cervical or supraclavicular adenopathy Lungs no rales or rhonchi Heart regular rate and rhythm Abd soft, nontender, positive bowel sounds MSK no focal spinal tenderness, no upper extremity lymphedema Neuro: nonfocal, well oriented, appropriate affect Breasts: The right breast is status postlumpectomy and radiation.  There is no evidence of local recurrence.  The left breast and both axillae are benign.   LAB RESULTS: Lab Results  Component Value Date   WBC 8.2 03/05/2021   NEUTROABS 4.8 03/05/2021   HGB 12.7 03/05/2021   HCT 38.0 03/05/2021   MCV 85.8 03/05/2021   PLT 327 03/05/2021      Chemistry      Component Value Date/Time   NA 140 03/05/2021 1130   NA 139 02/09/2017 0911   K 4.1  03/05/2021 1130   K 4.0 02/09/2017 0911   CL 108 03/05/2021 1130   CL 102 11/03/2012 0821   CO2 23 03/05/2021 1130   CO2 26 02/09/2017 0911   BUN 19 03/05/2021 1130   BUN 15.5 02/09/2017 0911   CREATININE 0.98 03/05/2021 1130   CREATININE 0.9 02/09/2017 0911      Component Value Date/Time   CALCIUM 9.7 03/05/2021 1130   CALCIUM 9.9 02/09/2017 0911   ALKPHOS 54 03/05/2021 1130   ALKPHOS 60 02/09/2017 0911   AST 16 03/05/2021 1130   AST 17 02/09/2017 0911   ALT 13 03/05/2021 1130   ALT 14 02/09/2017 0911   BILITOT 0.4 03/05/2021 1130   BILITOT 0.45 02/09/2017 0911      STUDIES: No results found.    ASSESSMENT: 60 y.o. [name] status post right breast lower inner quadrant biopsy 10/28/2012 for a clinical T1c N0, stage IA invasive ductal carcinoma, grade 2, estrogen and progesterone receptor positive, HER-2 not amplified, with an MIB-1 of 20%  (0) [name]'s genetics show a VUS, namely TP53 c.[id number]>T.    (1) s/p Right lumpectomy and sentinel lymph node sampling 11/17/2012 for a pT2 pN0, stage IIA invasive ductal carcinoma with prominent lobular features, grade 2, with initially positive  margins cleared intraoperatively; estrogen receptor positive with an Allred score of 7, progesterone receptor positive with an Allred score of 8, HercepTest 0, proliferation index by ACIS III 32% ([id number])   (2) Oncotype DX score of 33 predicting a risk of distant recurrence within 10 years of 23% if her only systemic treatment is tamoxifen for 5 years; the estrogen receptor was interpreted as negative on the Oncotype report  (3) adjuvant cyclophosphamide and docetaxel started 12/30/2012, first cycle given at [facility], with the remaining cycles given  at [facility] and completed 4th cycle 03/03/2013  (4)  adjuvant radiation therapy completed 05/10/2013  (5) started tamoxifen January 2015, stopped March 2015 due to depression  (6) anastrozole started 11/12/2013, discontinued 02/24/2014 with  significant arthralgias/myalgias, resumed November 2015 with no improvement in the arthralgias myalgias, again discontinued  (7) exemestane started November 2015  (a) held as of 03/14/2015 because of arthralgias and myalgias.  (8) letrozole started 05/16/2015 stopped 02/2018 by patient due to depletion of calcium   (a) bone density  04/23/2015 at [facility] shows osteoporosis with a T score of -3.0  (b) ibandronate started 02/15/2016, discontinued March 2019  (c) bone density 05/19/2017 at [facility] showed a T score of -3.1 osteoporosis.  (d) Zoledronate started on 08/27/2017, repeated every 6 months for 2 years, then yearly as of 03/08/2020  (e) repeat bone density 07/29/2019 shows a T score of -3.0.   PLAN:  Teegan is now a little over 8 years out from definitive surgery for her breast cancer with no evidence of disease recurrence.  This is very favorable.  We follow her chiefly for her breast density and osteoporosis.  She will receive zoledronate today and again next year.  I have set her up for repeat bone density when she has her next mammography at [place] September 2023  We have been doing breast MRIs about every year because of her density the breasts.  This is getting to be financially difficult for her so at this point she would prefer not to have an MRI in 2023.  She will simply have her mammogram.  Of course she will contact us if she notices any difference in either breast.  Otherwise we will see her a year from now with her next zoledronate dose.  Total encounter time 25 minutes.Sarajane Jews C. Jerae Izard, MD 03/05/21 12:39 PM Medical Oncology and Hematology [facility] [place], [place] [address] Tel. [phone]    Fax. [phone]   I, Wilburn Mylar, am acting as scribe for Dr. Virgie Dad. Caytlin Better.  I, Lurline Del MD, have reviewed the above documentation for accuracy and completeness, and I agree with the above.   *Total Encounter Time as  defined by the Centers for Medicare and Medicaid Services includes, in addition to the face-to-face time of a patient visit (documented in the note above) non-face-to-face time: obtaining and reviewing outside history, ordering and reviewing medications, tests or procedures, care coordination (communications with other health care professionals or caregivers) and documentation in the medical record.

## 2021-03-04 NOTE — Addendum Note (Signed)
Addended by: Neysa Hotter on: 03/04/2021 05:27 PM   Modules accepted: Orders

## 2021-03-05 ENCOUNTER — Other Ambulatory Visit: Payer: Self-pay

## 2021-03-05 ENCOUNTER — Inpatient Hospital Stay: Payer: BC Managed Care – PPO

## 2021-03-05 ENCOUNTER — Inpatient Hospital Stay: Payer: BC Managed Care – PPO | Attending: Oncology

## 2021-03-05 ENCOUNTER — Inpatient Hospital Stay (HOSPITAL_BASED_OUTPATIENT_CLINIC_OR_DEPARTMENT_OTHER): Payer: BC Managed Care – PPO | Admitting: Oncology

## 2021-03-05 VITALS — BP 125/65 | HR 76 | Temp 97.9°F | Resp 16 | Ht 63.0 in | Wt 152.3 lb

## 2021-03-05 DIAGNOSIS — Z86010 Personal history of colonic polyps: Secondary | ICD-10-CM | POA: Insufficient documentation

## 2021-03-05 DIAGNOSIS — Z923 Personal history of irradiation: Secondary | ICD-10-CM | POA: Insufficient documentation

## 2021-03-05 DIAGNOSIS — Z853 Personal history of malignant neoplasm of breast: Secondary | ICD-10-CM | POA: Diagnosis not present

## 2021-03-05 DIAGNOSIS — Z170 Estrogen receptor positive status [ER+]: Secondary | ICD-10-CM

## 2021-03-05 DIAGNOSIS — Z801 Family history of malignant neoplasm of trachea, bronchus and lung: Secondary | ICD-10-CM | POA: Diagnosis not present

## 2021-03-05 DIAGNOSIS — Z79899 Other long term (current) drug therapy: Secondary | ICD-10-CM | POA: Diagnosis not present

## 2021-03-05 DIAGNOSIS — K219 Gastro-esophageal reflux disease without esophagitis: Secondary | ICD-10-CM | POA: Insufficient documentation

## 2021-03-05 DIAGNOSIS — M818 Other osteoporosis without current pathological fracture: Secondary | ICD-10-CM | POA: Diagnosis not present

## 2021-03-05 DIAGNOSIS — C50311 Malignant neoplasm of lower-inner quadrant of right female breast: Secondary | ICD-10-CM | POA: Diagnosis not present

## 2021-03-05 LAB — CMP (CANCER CENTER ONLY)
ALT: 13 U/L (ref 0–44)
AST: 16 U/L (ref 15–41)
Albumin: 4.1 g/dL (ref 3.5–5.0)
Alkaline Phosphatase: 54 U/L (ref 38–126)
Anion gap: 9 (ref 5–15)
BUN: 19 mg/dL (ref 6–20)
CO2: 23 mmol/L (ref 22–32)
Calcium: 9.7 mg/dL (ref 8.9–10.3)
Chloride: 108 mmol/L (ref 98–111)
Creatinine: 0.98 mg/dL (ref 0.44–1.00)
GFR, Estimated: >60 mL/min (ref >60)
Glucose, Bld: 87 mg/dL (ref 70–99)
Potassium: 4.1 mmol/L (ref 3.5–5.1)
Sodium: 140 mmol/L (ref 135–145)
Total Bilirubin: 0.4 mg/dL (ref 0.3–1.2)
Total Protein: 7.3 g/dL (ref 6.5–8.1)

## 2021-03-05 LAB — CBC WITH DIFFERENTIAL (CANCER CENTER ONLY)
Abs Immature Granulocytes: 0.02 10*3/uL (ref 0.00–0.07)
Basophils Absolute: 0.1 10*3/uL (ref 0.0–0.1)
Basophils Relative: 1 %
Eosinophils Absolute: 0.4 10*3/uL (ref 0.0–0.5)
Eosinophils Relative: 5 %
HCT: 38 % (ref 36.0–46.0)
Hemoglobin: 12.7 g/dL (ref 12.0–15.0)
Immature Granulocytes: 0 %
Lymphocytes Relative: 29 %
Lymphs Abs: 2.4 10*3/uL (ref 0.7–4.0)
MCH: 28.7 pg (ref 26.0–34.0)
MCHC: 33.4 g/dL (ref 30.0–36.0)
MCV: 85.8 fL (ref 80.0–100.0)
Monocytes Absolute: 0.6 10*3/uL (ref 0.1–1.0)
Monocytes Relative: 7 %
Neutro Abs: 4.8 10*3/uL (ref 1.7–7.7)
Neutrophils Relative %: 58 %
Platelet Count: 327 10*3/uL (ref 150–400)
RBC: 4.43 MIL/uL (ref 3.87–5.11)
RDW: 13.5 % (ref 11.5–15.5)
WBC Count: 8.2 10*3/uL (ref 4.0–10.5)
nRBC: 0 % (ref 0.0–0.2)

## 2021-03-05 MED ORDER — ZOLEDRONIC ACID 4 MG/100ML IV SOLN
INTRAVENOUS | Status: AC
  Administered 2021-03-05: 4 mg via INTRAVENOUS
  Filled 2021-03-05: qty 100

## 2021-03-05 MED ORDER — ZOLEDRONIC ACID 4 MG/100ML IV SOLN: 4.0000 mg | Freq: Once | INTRAVENOUS | Status: AC

## 2021-03-05 NOTE — Patient Instructions (Signed)
[facility] MEDICAL ONCOLOGY  Discharge Instructions: Thank you for choosing [facility] to provide your oncology and hematology care.   If you have a lab appointment with the Cancer Center, please go directly to the Cancer Center and check in at the registration area.   Wear comfortable clothing and clothing appropriate for easy access to any Portacath or PICC line.   We strive to give you quality time with your provider. You may need to reschedule your appointment if you arrive late (15 or more minutes).  Arriving late affects you and other patients whose appointments are after yours.  Also, if you miss three or more appointments without notifying the office, you may be dismissed from the clinic at the provider's discretion.      For prescription refill requests, have your pharmacy contact our office and allow 72 hours for refills to be completed.    Today you received the following chemotherapy and/or immunotherapy agents: Zometa      To help prevent nausea and vomiting after your treatment, we encourage you to take your nausea medication as directed.  BELOW ARE SYMPTOMS THAT SHOULD BE REPORTED IMMEDIATELY: *FEVER GREATER THAN 100.4 F (38 C) OR HIGHER *CHILLS OR SWEATING *NAUSEA AND VOMITING THAT IS NOT CONTROLLED WITH YOUR NAUSEA MEDICATION *UNUSUAL SHORTNESS OF BREATH *UNUSUAL BRUISING OR BLEEDING *URINARY PROBLEMS (pain or burning when urinating, or frequent urination) *BOWEL PROBLEMS (unusual diarrhea, constipation, pain near the anus) TENDERNESS IN MOUTH AND THROAT WITH OR WITHOUT PRESENCE OF ULCERS (sore throat, sores in mouth, or a toothache) UNUSUAL RASH, SWELLING OR PAIN  UNUSUAL VAGINAL DISCHARGE OR ITCHING   Items with * indicate a potential emergency and should be followed up as soon as possible or go to the Emergency Department if any problems should occur.  Please show the CHEMOTHERAPY ALERT CARD or IMMUNOTHERAPY ALERT CARD at check-in to the  Emergency Department and triage nurse.  Should you have questions after your visit or need to cancel or reschedule your appointment, please contact [facility] MEDICAL [facility]COL[facility]  Dept: [phone]  and follow the prompts.  Office hours are 8:00 a.m. to 4:30 p.m. Monday - Friday. Please note that voicemails left after 4:00 p.m. may not be returned until the following business day.  We are closed weekends and major holidays. You have access to a nurse at all times for urgent questions. Please call the main number to the clinic Dept: [phone] and follow the prompts.   For any non-urgent questions, you may also contact your provider using MyChart. We now offer e-Visits for anyone 18 and older to request care online for non-urgent symptoms. For details visit [email or web address].   Also download the MyChart app! Go to the app store, search "MyChart", open the app, select [place], and log in with your MyChart username and password.  Due to Covid, a mask is required upon entering the hospital/clinic. If you do not have a mask, one will be given to you upon arrival. For doctor visits, patients may have 1 support person aged 18 or older with them. For treatment visits, patients cannot have anyone with them due to current Covid guidelines and our immunocompromised population.  

## 2021-03-12 ENCOUNTER — Ambulatory Visit: Payer: BC Managed Care – PPO

## 2021-03-12 ENCOUNTER — Ambulatory Visit: Payer: BC Managed Care – PPO | Admitting: Oncology

## 2021-03-12 ENCOUNTER — Other Ambulatory Visit: Payer: BC Managed Care – PPO

## 2021-04-29 ENCOUNTER — Ambulatory Visit (INDEPENDENT_AMBULATORY_CARE_PROVIDER_SITE_OTHER): Payer: BC Managed Care – PPO | Admitting: Psychiatry

## 2021-04-29 ENCOUNTER — Encounter: Payer: Self-pay | Admitting: Psychiatry

## 2021-04-29 ENCOUNTER — Other Ambulatory Visit: Payer: Self-pay

## 2021-04-29 DIAGNOSIS — F900 Attention-deficit hyperactivity disorder, predominantly inattentive type: Secondary | ICD-10-CM

## 2021-04-29 DIAGNOSIS — F411 Generalized anxiety disorder: Secondary | ICD-10-CM | POA: Diagnosis not present

## 2021-04-29 DIAGNOSIS — F5105 Insomnia due to other mental disorder: Secondary | ICD-10-CM | POA: Diagnosis not present

## 2021-04-29 DIAGNOSIS — F3342 Major depressive disorder, recurrent, in full remission: Secondary | ICD-10-CM | POA: Diagnosis not present

## 2021-04-29 MED ORDER — BUPROPION HCL ER (XL) 300 MG PO TB24: 300.0000 mg | ORAL_TABLET | Freq: Every day | ORAL | 2 refills | Status: DC

## 2021-04-29 MED ORDER — CLONAZEPAM 1 MG PO TABS: 1.0000 mg | ORAL_TABLET | Freq: Every day | ORAL | 1 refills | Status: DC

## 2021-04-29 NOTE — Progress Notes (Signed)
[name] [id number] 1960-11-11 60 y.o.  Subjective:   Patient ID:  [name] is a 60 y.o. (DOB 1961/06/11) female.  Chief Complaint:  Chief Complaint  Patient presents with   Follow-up   Depression   Anxiety   ADHD   Sleeping Problem    Depression        Associated symptoms include appetite change.  Associated symptoms include no decreased concentration and no suicidal ideas.  Past medical history includes anxiety.   Anxiety Patient reports no confusion, decreased concentration, nervous/anxious behavior or suicidal ideas.    [name] presents to the office today for follow-up of depression and ADD.  Last seen August 23, 2018.  She had reduced the Abilify to 5 mg and was doing well.  She wanted to try to taper off of that medication.   visit June 2020.  Reduced abilify to 2 mg daily without any problems.   visit October 2020.  She was still doing well and she wanted to try discontinuing the Abilify which was reasonable so it was stopped.  January 2021 appointment with the following noted: No meds were changed. Healthy.  No problem off the Abilify.  Depression is under control.  Very stressful last 2 weeks which created some anxiety.  Still working long hours. Stopped Vyvanse bc didn't feel she needed it any longer about 3 weeks ago.  Tremor resolved.  No worse depression.  Other stressors have occurred related to the marriage.  Anxiety is still manageable.  Sleep 7 hours until worse last 2 weeks with work demands.  Sometimes goes to bed to late.  Work from home and a lot. 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  Mother living with her until July.   Sleep OK usually.  Diazepam mainly used for sleep and it helps with Flexeril. Wants to eventually try reducing Wellbutrin to see if tremor is better.  Dating and feels better.  January 04, 2020 appointment with the following noted: Still doing fine with mood.  Both depression and anxiety managed.  Wants to  reduce Wellbutrin to 150 mg daily. Diazepam about once weekly for sleep.  Usually stress work related. No med changes  08/29/2020 appointment with the following noted: More depression when tapered off Wellbutrin.  More sadness and resolved back on the Wellbutrin and not depressed now.   Started vitamin B6 which looked Wellbutrin and not sure if she accidentally missed Wellbutrin for a week.  Will restart it. Managing chronic work stress. Not sleeping enough and doesn't think Valium 15 mg for 2 weeks is helping with EMA 4 AM with 7 hours good night.  Time change made it worse.  No SE.  Normal sleep 8 hours. Typical is 6 hours.  Work 8-10 hours daily. Down to 1 cup coffee am. Plan: Trial switch back to clonazepam1 mg HS for EMA  10/29/20 appt noted: Awaking 1-2 times per week but less EMA than before.  Tolerating it fine.  No handogover. Less tired daytime. 6-7 hours of sleep. Patient reports stable mood and denies depressed or irritable moods.  Patient denies any recent difficulty with anxiety.  Not anxious for awhile.. Denies appetite disturbance.  Patient reports that energy and motivation have been good.  Patient denies any difficulty with concentration.  Patient denies any suicidal ideation. No med changes  04/29/21 appt noted: Still on Wellbutrin 300 and clonazepam 1 mg HS. Doing fine with a lot of stress at work but last week should be end  with new project.   Sleep fine with clonazepam 6-7 hours with no awakening.  No hangover effects. No SE. Patient reports stable mood and denies depressed or irritable moods.  Patient denies any recent difficulty with anxiety.  Patient denies difficulty with sleep initiation or maintenance. Denies appetite disturbance.  Patient reports that energy and motivation have been good.  Patient denies any difficulty with concentration.  Patient denies any suicidal ideation. Stress eating working from home. 9 gkids 61yo to 60 yo  Past Psychiatric Medication  Trials: Temazepam, clonazepam, Ambien no resp, Flexeril, diazepam 15 Lost resp, remote trazodone NR Abilify 10,  lithium, lamotrigine,  bupropion 450 tremor, Failed attempt to DC Wellbutrin.Marland Kitchen buspirone 30 twice daily, venlafaxine hypomania, fluoxetine,  Adderall, Vyvanse, [place]o,   Review of Systems:  Review of Systems  Constitutional:  Positive for appetite change and unexpected weight change.  Musculoskeletal:  Positive for arthralgias.  Neurological:  Negative for tremors and weakness.  Psychiatric/Behavioral:  Positive for sleep disturbance. Negative for agitation, behavioral problems, confusion, decreased concentration, dysphoric mood, hallucinations, self-injury and suicidal ideas. The patient is not nervous/anxious and is not hyperactive.    Medications: I have reviewed the patient's current medications.  Current Outpatient Medications  Medication Sig Dispense Refill   Calcium-Vitamin D-Vitamin K (CHEWABLE CALCIUM PO) Take by mouth.     Cholecalciferol (VITAMIN D3 GUMMIES ADULT PO) Take by mouth.     Multiple Vitamin (MULTIVITAMIN) tablet Take 1 tablet by mouth daily.     buPROPion (WELLBUTRIN XL) 300 MG 24 hr tablet Take 1 tablet (300 mg total) by mouth daily. 90 tablet 2   clonazePAM (KLONOPIN) 1 MG tablet Take 1 tablet (1 mg total) by mouth at bedtime. 90 tablet 1   No current facility-administered medications for this visit.    Medication Side Effects: None  Allergies:  Allergies  Allergen Reactions   Morphine And Related Itching    Past Medical History:  Diagnosis Date   Anxiety    Asthma    as a child, early adult...none now   BRCA negative 06/2013   BRCA I & II negative   Breast cancer ([place]) 10/28/12   right breast   Colonic mass 07/28/2011   ascending colon and found a polyp 2.5 cm adenoma - hemicoloectomy   Depression    GERD (gastroesophageal reflux disease)    occas  at  night....twice a  month--otc meds   Recurrent upper respiratory infection (URI)     started 1/28.....getting better   S/P chemotherapy, time since greater than 12 weeks     Family History  Problem Relation Age of Onset   Heart disease Father    Cancer Paternal Aunt        unknown form of cancer   Lung cancer Maternal Grandmother    Testicular cancer Cousin        paternal cousin; died in 66s   Pancreatic cancer Cousin 93   Colon cancer Other        paternal grandmother's sister   Cancer Other        MGM's sister    Social History   Socioeconomic History   Marital status: Married    Spouse name: [name]   Number of children: 2   Years of education: Not on file   Highest education level: Not on file  Occupational History    Employer: [employer]  Tobacco Use   Smoking status: Never   Smokeless tobacco: Never  Substance and Sexual Activity   Alcohol use: Yes  Comment: wine occasionally   Drug use: No   Sexual activity: Yes    Birth control/protection: Surgical  Other Topics Concern   Not on file  Social History Narrative   Not on file   Social Determinants of Health   Financial Resource Strain: Not on file  Food Insecurity: Not on file  Transportation Needs: Not on file  Physical Activity: Not on file  Stress: Not on file  Social Connections: Not on file  Intimate Partner Violence: Not on file    Past Medical History, Surgical history, Social history, and Family history were reviewed and updated as appropriate.   Please see review of systems for further details on the patient's review from today.   Objective:   Physical Exam:  There were no vitals taken for this visit.  Physical Exam Constitutional:      General: She is not in acute distress.    Appearance: She is well-developed.  Musculoskeletal:        General: No deformity.  Neurological:     Mental Status: She is alert and oriented to person, place, and time.     Motor: No tremor.     Coordination: Coordination normal.     Gait: Gait normal.  Psychiatric:        Attention and  Perception: She is attentive.        Mood and Affect: Mood is not anxious or depressed. Affect is not labile, blunt, angry or inappropriate.        Speech: Speech normal. Speech is not slurred.        Behavior: Behavior normal.        Thought Content: Thought content normal. Thought content does not include homicidal or suicidal ideation. Thought content does not include homicidal or suicidal plan.        Cognition and Memory: Cognition normal.        Judgment: Judgment normal.     Comments: Insight intact. No auditory or visual hallucinations. No delusions.  Recent stress getting better.    Lab Review:     Component Value Date/Time   NA 140 03/05/2021 1130   NA 139 02/09/2017 0911   K 4.1 03/05/2021 1130   K 4.0 02/09/2017 0911   CL 108 03/05/2021 1130   CL 102 11/03/2012 0821   CO2 23 03/05/2021 1130   CO2 26 02/09/2017 0911   GLUCOSE 87 03/05/2021 1130   GLUCOSE 104 02/09/2017 0911   GLUCOSE 93 11/03/2012 0821   BUN 19 03/05/2021 1130   BUN 15.5 02/09/2017 0911   CREATININE 0.98 03/05/2021 1130   CREATININE 0.9 02/09/2017 0911   CALCIUM 9.7 03/05/2021 1130   CALCIUM 9.9 02/09/2017 0911   PROT 7.3 03/05/2021 1130   PROT 7.4 02/09/2017 0911   ALBUMIN 4.1 03/05/2021 1130   ALBUMIN 4.1 02/09/2017 0911   AST 16 03/05/2021 1130   AST 17 02/09/2017 0911   ALT 13 03/05/2021 1130   ALT 14 02/09/2017 0911   ALKPHOS 54 03/05/2021 1130   ALKPHOS 60 02/09/2017 0911   BILITOT 0.4 03/05/2021 1130   BILITOT 0.45 02/09/2017 0911   GFRNONAA >60 03/05/2021 1130   GFRAA >60 03/08/2020 0835       Component Value Date/Time   WBC 8.2 03/05/2021 1130   WBC 6.7 02/24/2019 0939   RBC 4.43 03/05/2021 1130   HGB 12.7 03/05/2021 1130   HGB 12.6 02/09/2017 0911   HCT 38.0 03/05/2021 1130   HCT 38.8 02/09/2017 0911   PLT 327  03/05/2021 1130   PLT 295 02/09/2017 0911   MCV 85.8 03/05/2021 1130   MCV 87.4 02/09/2017 0911   MCH 28.7 03/05/2021 1130   MCHC 33.4 03/05/2021 1130   RDW  13.5 03/05/2021 1130   RDW 14.2 02/09/2017 0911   LYMPHSABS 2.4 03/05/2021 1130   LYMPHSABS 1.6 02/09/2017 0911   MONOABS 0.6 03/05/2021 1130   MONOABS 0.5 02/09/2017 0911   EOSABS 0.4 03/05/2021 1130   EOSABS 0.1 02/09/2017 0911   BASOSABS 0.1 03/05/2021 1130   BASOSABS 0.1 02/09/2017 0911    No results found for: POCLITH, LITHIUM   No results found for: PHENYTOIN, PHENOBARB, VALPROATE, CBMZ   .res Assessment: Plan:    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  Failed attempt to DC Wellbutrin therefore no incation to change Continue Wellbutrin XL 300 mg AM  Call if there is any recurrence of depression.  Disc timing of antidepressants and effects and SE.  Sleep hygiene discussed.   Sleep is worse with a lot of stress going on.  We discussed the short-term risks associated with benzodiazepines including sedation and increased fall risk among others.  Discussed long-term side effect risk including dependence, potential withdrawal symptoms, and the potential eventual dose-related risk of dementia.  But recent studies from 2020 dispute this association between benzodiazepines and dementia risk. Newer studies in 2020 do not support an association with dementia. clonazepam1 mg HS for EMA Option increase.  Sleep hygiene.  No changes  She agrees to the plan  Follow-up  9 mos  Lynder Parents MD, DFAPA   Please see After Visit Summary for patient specific instructions.  Future Appointments  Date Time Provider [place]  03/03/2022  8:15 AM [facility] None  03/03/2022  8:45 AM Nicholas Lose, MD [facility] None   03/03/2022  9:45 AM [facility] N[facility] None    No orders of the defined types were placed in this encounter.     -------------------------------

## 2021-06-21 DIAGNOSIS — Z20822 Contact with and (suspected) exposure to covid-19: Secondary | ICD-10-CM | POA: Diagnosis not present

## 2021-08-02 DIAGNOSIS — B351 Tinea unguium: Secondary | ICD-10-CM | POA: Diagnosis not present

## 2021-08-15 DIAGNOSIS — F4320 Adjustment disorder, unspecified: Secondary | ICD-10-CM | POA: Diagnosis not present

## 2021-08-19 ENCOUNTER — Telehealth: Payer: Self-pay | Admitting: Psychiatry

## 2021-08-19 ENCOUNTER — Other Ambulatory Visit: Payer: Self-pay | Admitting: Psychiatry

## 2021-08-19 MED ORDER — HYDROXYZINE HCL 10 MG PO TABS: ORAL_TABLET | ORAL | 0 refills | Status: AC

## 2021-08-19 NOTE — Telephone Encounter (Signed)
Called patient. She said she is having itching on the soles of her feet and her palms. She feels like this may be related to anxiety. She is under stress at work and in her marriage. She has not tried any type of allergy medication. The itching is not constant. She has used lotion and it has not helped. She has gotten hydrocortisone cream to try the next time it happens. She states this is new to her.  ?

## 2021-08-19 NOTE — Telephone Encounter (Signed)
Pt called stating she is getting this severe itching. Provoked by anxiety. Not sure if meds causing this and if need to be seen before follow up on 8/14. Contact # [phone]   ?

## 2021-08-19 NOTE — Telephone Encounter (Signed)
Hydroxyzine is the most effective medicine for itching.  It would last 6 to 8 hours in effect.  She can also take it for anxiety as it is often helpful for that.  It is not habit-forming or addicting.  She can take 1 to 2 tablets 3 times a day as needed.  Prescription was sent ?

## 2021-08-20 NOTE — Telephone Encounter (Signed)
Patient provided recommendations and informed that Rx had been sent.  ?

## 2021-08-21 DIAGNOSIS — F4320 Adjustment disorder, unspecified: Secondary | ICD-10-CM | POA: Diagnosis not present

## 2021-08-28 DIAGNOSIS — M170 Bilateral primary osteoarthritis of knee: Secondary | ICD-10-CM | POA: Diagnosis not present

## 2021-08-29 DIAGNOSIS — F4322 Adjustment disorder with anxiety: Secondary | ICD-10-CM | POA: Diagnosis not present

## 2021-09-10 DIAGNOSIS — Z01411 Encounter for gynecological examination (general) (routine) with abnormal findings: Secondary | ICD-10-CM | POA: Diagnosis not present

## 2021-09-10 DIAGNOSIS — Z0142 Encounter for cervical smear to confirm findings of recent normal smear following initial abnormal smear: Secondary | ICD-10-CM | POA: Diagnosis not present

## 2021-09-10 DIAGNOSIS — Z124 Encounter for screening for malignant neoplasm of cervix: Secondary | ICD-10-CM | POA: Diagnosis not present

## 2021-09-10 DIAGNOSIS — Z113 Encounter for screening for infections with a predominantly sexual mode of transmission: Secondary | ICD-10-CM | POA: Diagnosis not present

## 2021-09-10 DIAGNOSIS — Z01419 Encounter for gynecological examination (general) (routine) without abnormal findings: Secondary | ICD-10-CM | POA: Diagnosis not present

## 2021-09-10 DIAGNOSIS — Z6826 Body mass index (BMI) 26.0-26.9, adult: Secondary | ICD-10-CM | POA: Diagnosis not present

## 2021-09-11 DIAGNOSIS — F4322 Adjustment disorder with anxiety: Secondary | ICD-10-CM | POA: Diagnosis not present

## 2021-09-17 DIAGNOSIS — I788 Other diseases of capillaries: Secondary | ICD-10-CM | POA: Diagnosis not present

## 2021-09-17 DIAGNOSIS — B351 Tinea unguium: Secondary | ICD-10-CM | POA: Diagnosis not present

## 2021-09-17 DIAGNOSIS — L814 Other melanin hyperpigmentation: Secondary | ICD-10-CM | POA: Diagnosis not present

## 2021-09-17 DIAGNOSIS — D1801 Hemangioma of skin and subcutaneous tissue: Secondary | ICD-10-CM | POA: Diagnosis not present

## 2021-09-17 DIAGNOSIS — L821 Other seborrheic keratosis: Secondary | ICD-10-CM | POA: Diagnosis not present

## 2021-09-25 DIAGNOSIS — E7800 Pure hypercholesterolemia, unspecified: Secondary | ICD-10-CM | POA: Diagnosis not present

## 2021-09-25 DIAGNOSIS — Z0000 Encounter for general adult medical examination without abnormal findings: Secondary | ICD-10-CM | POA: Diagnosis not present

## 2021-10-03 DIAGNOSIS — F4322 Adjustment disorder with anxiety: Secondary | ICD-10-CM | POA: Diagnosis not present

## 2021-11-07 DIAGNOSIS — R7401 Elevation of levels of liver transaminase levels: Secondary | ICD-10-CM | POA: Diagnosis not present

## 2021-11-07 DIAGNOSIS — F4322 Adjustment disorder with anxiety: Secondary | ICD-10-CM | POA: Diagnosis not present

## 2021-11-28 DIAGNOSIS — F4322 Adjustment disorder with anxiety: Secondary | ICD-10-CM | POA: Diagnosis not present

## 2021-12-20 DIAGNOSIS — L089 Local infection of the skin and subcutaneous tissue, unspecified: Secondary | ICD-10-CM | POA: Diagnosis not present

## 2021-12-20 DIAGNOSIS — S80869A Insect bite (nonvenomous), unspecified lower leg, initial encounter: Secondary | ICD-10-CM | POA: Diagnosis not present

## 2021-12-20 DIAGNOSIS — S70269A Insect bite (nonvenomous), unspecified hip, initial encounter: Secondary | ICD-10-CM | POA: Diagnosis not present

## 2021-12-20 DIAGNOSIS — S70369A Insect bite (nonvenomous), unspecified thigh, initial encounter: Secondary | ICD-10-CM | POA: Diagnosis not present

## 2022-01-27 ENCOUNTER — Ambulatory Visit: Payer: BC Managed Care – PPO | Admitting: Psychiatry

## 2022-01-28 ENCOUNTER — Other Ambulatory Visit: Payer: Self-pay | Admitting: Psychiatry

## 2022-01-28 DIAGNOSIS — F5105 Insomnia due to other mental disorder: Secondary | ICD-10-CM

## 2022-02-20 DIAGNOSIS — F4322 Adjustment disorder with anxiety: Secondary | ICD-10-CM | POA: Diagnosis not present

## 2022-02-25 NOTE — Progress Notes (Signed)
Patient Care Team: Carol Ada, MD as PCP - General (Family Medicine) Paralee Cancel, MD as Consulting Physician (Orthopedic Surgery) Magrinat, Virgie Dad, MD (Inactive) as Consulting Physician (Oncology) Cottle, Billey Co., MD as Consulting Physician (Psychiatry)  DIAGNOSIS:  Encounter Diagnosis  Name Primary?   Malignant neoplasm of lower-inner quadrant of right breast of female, estrogen receptor positive ([place])     CHIEF COMPLIANT: follow up of her estrogen receptor positive breast cancer. Establish oncology care with Dr. Lindi Adie   INTERVAL HISTORY: [name] is a 61 y.o. with the above mention. She presents to the clinic today for a follow-up Establish oncology care with Dr. Lindi Adie. She denies any symptoms or new problems. She tries to walk for exercise. She walks her dog.  She denies any lumps or nodules in the breast.  ALLERGIES:  is allergic to morphine and related.  MEDICATIONS:  Current Outpatient Medications  Medication Sig Dispense Refill   alendronate (FOSAMAX) 70 MG tablet Take 1 tablet (70 mg total) by mouth once a week. Take with a full glass of water on an empty stomach. 12 tablet 3   hydrOXYzine (ATARAX) 10 MG tablet 1-2 tablets 3 times daily as needed for itching 100 tablet 0   buPROPion (WELLBUTRIN XL) 300 MG 24 hr tablet Take 1 tablet (300 mg total) by mouth daily. 90 tablet 2   Calcium-Vitamin D-Vitamin K (CHEWABLE CALCIUM PO) Take by mouth.     Cholecalciferol (VITAMIN D3 GUMMIES ADULT PO) Take by mouth.     clonazePAM (KLONOPIN) 1 MG tablet TAKE 1 TABLET BY MOUTH AT BEDTIME 90 tablet 0   Multiple Vitamin (MULTIVITAMIN) tablet Take 1 tablet by mouth daily.     No current facility-administered medications for this visit.    PHYSICAL EXAMINATION: ECOG PERFORMANCE STATUS: 0 - Asymptomatic  Vitals:   03/03/22 0841  BP: (!) 111/58  Pulse: 75  Resp: 18  Temp: 98.3 F (36.8 C)  SpO2: 100%   Filed Weights   03/03/22 0841  Weight: 142 lb 8 oz  (64.6 kg)    BREAST: No palpable masses or nodules in either right or left breasts. No palpable axillary supraclavicular or infraclavicular adenopathy no breast tenderness or nipple discharge. (exam performed in the presence of a chaperone)  LABORATORY DATA:  I have reviewed the data as listed    Latest Ref Rng & Units 03/05/2021   11:30 AM 03/08/2020    8:35 AM 02/24/2019    9:39 AM  CMP  Glucose 70 - 99 mg/dL 87  79  87   BUN 6 - 20 mg/dL _0 Creatinine 0.44 - 1.00 mg/dL 0.98  1.06  0.91   Sodium 135 - 145 mmol/L 140  141  142   Potassium 3.5 - 5.1 mmol/L 4.1  4.2  4.4   Chloride 98 - 111 mmol/L 108  104  105   CO2 22 - 32 mmol/L _1 Calcium 8.9 - 10.3 mg/dL 9.7  9.7  10.1   Total Protein 6.5 - 8.1 g/dL 7.3  7.5  7.5   Total Bilirubin 0.3 - 1.2 mg/dL 0.4  0.5  0.3   Alkaline Phos 38 - 126 U/L 54  54  45   AST 15 - 41 U/L _2 ALT 0 - 44 U/L _3 Lab Results  Component Value Date   WBC 5.5 03/03/2022  HGB 13.0 03/03/2022   HCT 39.4 03/03/2022   MCV 87.0 03/03/2022   PLT 295 03/03/2022   NEUTROABS 3.2 03/03/2022    ASSESSMENT & PLAN:  Malignant neoplasm of lower-inner quadrant of right breast of female, estrogen receptor positive ([place]) 10/28/2012: Clinical T1CN0 stage Ia grade 2 IDC ER/PR positive HER2 negative Ki-67 20% 11/17/2012: Right lumpectomy: T2N0 stage IIa IDC with lobular features, grade 2 ER/PR positive HER2 negative Ki-67 32%, Oncotype DX score 33: Risk of distant recurrence 23% 12/30/2012: Taxotere and Cytoxan x4 completed 03/03/2013 05/10/2013: Adjuvant radiation  Antiestrogen treatment summary: Tamoxifen started January 2015 stopped in March 2015 due to depression switched to anastrozole 11/12/2013 discontinued 02/24/2014 due to arthralgias and myalgias, switched to exemestane November 2015 and then switched to letrozole 05/16/2015 stopped 03/05/2018 due to osteoporosis  Breast cancer surveillance: 1.  Breast exam 03/03/2022:  Benign 2. mammogram 02/21/2021: Benign 3.  I recommend patient get breast MRI next year.  She will check on how much money is in her FSA account and make a determination whether she wants to do it in February or April.  Osteoporosis: I recommended that we start her on bisphosphonate therapy.  She is agreeable to now start on Fosamax.  She takes calcium and vitamin D.  Return to clinic in 1 year for follow-up with survivorship clinic with Mendel Ryder.    No orders of the defined types were placed in this encounter.  The patient has a good understanding of the overall plan. she agrees with it. she will call with any problems that may develop before the next visit here. Total time spent: 30 mins including face to face time and time spent for planning, charting and co-ordination of care   Harriette Ohara, MD 03/03/22    I Gardiner Coins am scribing for Dr. Lindi Adie  I have reviewed the above documentation for accuracy and completeness, and I agree with the above.

## 2022-02-26 DIAGNOSIS — G4733 Obstructive sleep apnea (adult) (pediatric): Secondary | ICD-10-CM | POA: Diagnosis not present

## 2022-02-28 ENCOUNTER — Other Ambulatory Visit: Payer: Self-pay

## 2022-02-28 DIAGNOSIS — Z170 Estrogen receptor positive status [ER+]: Secondary | ICD-10-CM

## 2022-03-03 ENCOUNTER — Other Ambulatory Visit: Payer: Self-pay | Admitting: Hematology and Oncology

## 2022-03-03 ENCOUNTER — Inpatient Hospital Stay: Payer: BC Managed Care – PPO | Attending: Hematology and Oncology

## 2022-03-03 ENCOUNTER — Inpatient Hospital Stay: Payer: BC Managed Care – PPO

## 2022-03-03 ENCOUNTER — Other Ambulatory Visit: Payer: Self-pay

## 2022-03-03 ENCOUNTER — Inpatient Hospital Stay (HOSPITAL_BASED_OUTPATIENT_CLINIC_OR_DEPARTMENT_OTHER): Payer: BC Managed Care – PPO | Admitting: Hematology and Oncology

## 2022-03-03 VITALS — BP 103/55 | HR 66 | Temp 98.0°F | Resp 18

## 2022-03-03 DIAGNOSIS — Z79811 Long term (current) use of aromatase inhibitors: Secondary | ICD-10-CM | POA: Insufficient documentation

## 2022-03-03 DIAGNOSIS — C50311 Malignant neoplasm of lower-inner quadrant of right female breast: Secondary | ICD-10-CM | POA: Insufficient documentation

## 2022-03-03 DIAGNOSIS — M818 Other osteoporosis without current pathological fracture: Secondary | ICD-10-CM

## 2022-03-03 DIAGNOSIS — Z170 Estrogen receptor positive status [ER+]: Secondary | ICD-10-CM

## 2022-03-03 DIAGNOSIS — M810 Age-related osteoporosis without current pathological fracture: Secondary | ICD-10-CM | POA: Insufficient documentation

## 2022-03-03 LAB — CBC WITH DIFFERENTIAL (CANCER CENTER ONLY)
Abs Immature Granulocytes: 0.01 10*3/uL (ref 0.00–0.07)
Basophils Absolute: 0 10*3/uL (ref 0.0–0.1)
Basophils Relative: 1 %
Eosinophils Absolute: 0.2 10*3/uL (ref 0.0–0.5)
Eosinophils Relative: 3 %
HCT: 39.4 % (ref 36.0–46.0)
Hemoglobin: 13 g/dL (ref 12.0–15.0)
Immature Granulocytes: 0 %
Lymphocytes Relative: 31 %
Lymphs Abs: 1.7 10*3/uL (ref 0.7–4.0)
MCH: 28.7 pg (ref 26.0–34.0)
MCHC: 33 g/dL (ref 30.0–36.0)
MCV: 87 fL (ref 80.0–100.0)
Monocytes Absolute: 0.4 10*3/uL (ref 0.1–1.0)
Monocytes Relative: 8 %
Neutro Abs: 3.2 10*3/uL (ref 1.7–7.7)
Neutrophils Relative %: 57 %
Platelet Count: 295 10*3/uL (ref 150–400)
RBC: 4.53 MIL/uL (ref 3.87–5.11)
RDW: 13.6 % (ref 11.5–15.5)
WBC Count: 5.5 10*3/uL (ref 4.0–10.5)
nRBC: 0 % (ref 0.0–0.2)

## 2022-03-03 LAB — CMP (CANCER CENTER ONLY)
ALT: 18 U/L (ref 0–44)
AST: 18 U/L (ref 15–41)
Albumin: 4.5 g/dL (ref 3.5–5.0)
Alkaline Phosphatase: 48 U/L (ref 38–126)
Anion gap: 11 (ref 5–15)
BUN: 16 mg/dL (ref 8–23)
CO2: 25 mmol/L (ref 22–32)
Calcium: 10.3 mg/dL (ref 8.9–10.3)
Chloride: 106 mmol/L (ref 98–111)
Creatinine: 0.89 mg/dL (ref 0.44–1.00)
GFR, Estimated: >60 mL/min (ref >60)
Glucose, Bld: 80 mg/dL (ref 70–99)
Potassium: 3.6 mmol/L (ref 3.5–5.1)
Sodium: 142 mmol/L (ref 135–145)
Total Bilirubin: 0.4 mg/dL (ref 0.3–1.2)
Total Protein: 7.1 g/dL (ref 6.5–8.1)

## 2022-03-03 MED ORDER — ALENDRONATE SODIUM 70 MG PO TABS: 70.0000 mg | ORAL_TABLET | ORAL | 3 refills | Status: DC

## 2022-03-03 MED ORDER — SODIUM CHLORIDE 0.9 % IV SOLN: INTRAVENOUS | Status: DC

## 2022-03-03 MED ORDER — ZOLEDRONIC ACID 4 MG/100ML IV SOLN
4.0000 mg | Freq: Once | INTRAVENOUS | Status: AC
  Administered 2022-03-03: 4 mg via INTRAVENOUS
  Filled 2022-03-03: qty 100

## 2022-03-03 NOTE — Assessment & Plan Note (Addendum)
10/28/2012: Clinical T1CN0 stage Ia grade 2 IDC ER/PR positive HER2 negative Ki-67 20% 11/17/2012: Right lumpectomy: T2N0 stage IIa IDC with lobular features, grade 2 ER/PR positive HER2 negative Ki-67 32%, Oncotype DX score 33: Risk of distant recurrence 23% 12/30/2012: Taxotere and Cytoxan x4 completed 03/03/2013 05/10/2013: Adjuvant radiation  Antiestrogen treatment summary: Tamoxifen started January 2015 stopped in March 2015 due to depression switched to anastrozole 11/12/2013 discontinued 02/24/2014 due to arthralgias and myalgias, switched to exemestane November 2015 and then switched to letrozole 05/16/2015 stopped 03/05/2018 due to osteoporosis  Breast cancer surveillance: 1.  Breast exam 03/03/2022: Benign 2. mammogram 02/21/2021: Benign 3.  I recommend patient get breast MRI next year.  She will check on how much money is in her FSA account and make a determination whether she wants to do it in February or April.  Return to clinic in 1 year for follow-up  

## 2022-03-03 NOTE — Patient Instructions (Signed)
[name]  Discharge Instructions: Thank you for choosing [facility] to provide your oncology and hematology care.   If you have a lab appointment with the [facility], please go directly to the [facility] and check in at the registration area.   Wear comfortable clothing and clothing appropriate for easy access to any Portacath or PICC line.   We strive to give you quality time with your provider. You may need to reschedule your appointment if you arrive late (15 or more minutes).  Arriving late affects you and other patients whose appointments are after yours.  Also, if you miss three or more appointments without notifying the office, you may be dismissed from the clinic at the provider's discretion.      For prescription refill requests, have your pharmacy contact our office and allow 72 hours for refills to be completed.    Today you received the following chemotherapy and/or immunotherapy agents Zometa      To help prevent nausea and vomiting after your treatment, we encourage you to take your nausea medication as directed.  BELOW ARE SYMPTOMS THAT SHOULD BE REPORTED IMMEDIATELY: *FEVER GREATER THAN 100.4 F (38 C) OR HIGHER *CHILLS OR SWEATING *NAUSEA AND VOMITING THAT IS NOT CONTROLLED WITH YOUR NAUSEA MEDICATION *UNUSUAL SHORTNESS OF BREATH *UNUSUAL BRUISING OR BLEEDING *URINARY PROBLEMS (pain or burning when urinating, or frequent urination) *BOWEL PROBLEMS (unusual diarrhea, constipation, pain near the anus) TENDERNESS IN MOUTH AND THROAT WITH OR WITHOUT PRESENCE OF ULCERS (sore throat, sores in mouth, or a toothache) UNUSUAL RASH, SWELLING OR PAIN  UNUSUAL VAGINAL DISCHARGE OR ITCHING   Items with * indicate a potential emergency and should be followed up as soon as possible or go to the Emergency Department if any problems should occur.  Please show the CHEMOTHERAPY ALERT CARD or IMMUNOTHERAPY ALERT CARD at check-in to the  Emergency Department and triage nurse.  Should you have questions after your visit or need to cancel or reschedule your appointment, please contact [facility]  Dept: [phone]  and follow the prompts.  Office hours are 8:00 a.m. to 4:30 p.m. Monday - Friday. Please note that voicemails left after 4:00 p.m. may not be returned until the following business day.  We are closed weekends and major holidays. You have access to a nurse at all times for urgent questions. Please call the main number to the clinic Dept: [phone] and follow the prompts.   For any non-urgent questions, you may also contact your provider using MyChart. We now offer e-Visits for anyone 59 and older to request care online for non-urgent symptoms. For details visit [email or web address].   Also download the MyChart app! Go to the app store, search "MyChart", open the app, select [facility], and log in with your MyChart username and password.  Due to Covid, a mask is required upon entering the hospital/clinic. If you do not have a mask, one will be given to you upon arrival. For doctor visits, patients may have 1 support person aged 6 or older with them. For treatment visits, patients cannot have anyone with them due to current Covid guidelines and our immunocompromised population.

## 2022-03-03 NOTE — Progress Notes (Signed)
Patient to get zometa today. She had dental clearance per pt.- her SCR is normal and her corrected CA is 9.9

## 2022-03-05 ENCOUNTER — Ambulatory Visit: Payer: BC Managed Care – PPO | Admitting: Psychiatry

## 2022-03-05 ENCOUNTER — Encounter: Payer: Self-pay | Admitting: Psychiatry

## 2022-03-05 DIAGNOSIS — F411 Generalized anxiety disorder: Secondary | ICD-10-CM | POA: Diagnosis not present

## 2022-03-05 DIAGNOSIS — F5105 Insomnia due to other mental disorder: Secondary | ICD-10-CM

## 2022-03-05 DIAGNOSIS — F3342 Major depressive disorder, recurrent, in full remission: Secondary | ICD-10-CM | POA: Diagnosis not present

## 2022-03-05 DIAGNOSIS — F900 Attention-deficit hyperactivity disorder, predominantly inattentive type: Secondary | ICD-10-CM

## 2022-03-05 MED ORDER — BUPROPION HCL ER (XL) 300 MG PO TB24: 300.0000 mg | ORAL_TABLET | Freq: Every day | ORAL | 2 refills | Status: DC

## 2022-03-05 MED ORDER — CLONAZEPAM 1 MG PO TABS: 1.0000 mg | ORAL_TABLET | Freq: Every day | ORAL | 3 refills | Status: DC

## 2022-03-05 NOTE — Progress Notes (Signed)
[name] [id number] [birth year] 61 y.o.  Subjective:   Patient ID:  [name] is a 61 y.o. (DOB [birth year]) female.  Chief Complaint:  Chief Complaint  Patient presents with   Follow-up   Depression   Anxiety    Depression        Associated symptoms include appetite change.  Associated symptoms include no decreased concentration and no suicidal ideas.  Past medical history includes anxiety.   Anxiety Patient reports no confusion, decreased concentration, nervous/anxious behavior or suicidal ideas.     [name] presents to the office today for follow-up of depression and ADD.  seen August 23, 2018.  She had reduced the Abilify to 5 mg and was doing well.  She wanted to try to taper off of that medication.   visit June 2020.  Reduced abilify to 2 mg daily without any problems.   visit October 2020.  She was still doing well and she wanted to try discontinuing the Abilify which was reasonable so it was stopped.  January 2021 appointment with the following noted: No meds were changed. Healthy.  No problem off the Abilify.  Depression is under control.  Very stressful last 2 weeks which created some anxiety.  Still working long hours. Stopped Vyvanse bc didn't feel she needed it any longer about 3 weeks ago.  Tremor resolved.  No worse depression.  Other stressors have occurred related to the marriage.  Anxiety is still manageable.  Sleep 7 hours until worse last 2 weeks with work demands.  Sometimes goes to bed to late.  Work from home and a lot. 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  Mother living with her until July.   Sleep OK usually.  Diazepam mainly used for sleep and it helps with Flexeril. Wants to eventually try reducing Wellbutrin to see if tremor is better.  Dating and feels better.  January 04, 2020 appointment with the following noted: Still doing fine with mood.  Both depression and anxiety managed.  Wants to reduce Wellbutrin to 150 mg  daily. Diazepam about once weekly for sleep.  Usually stress work related. No med changes  08/29/2020 appointment with the following noted: More depression when tapered off Wellbutrin.  More sadness and resolved back on the Wellbutrin and not depressed now.   Started vitamin B6 which looked Wellbutrin and not sure if she accidentally missed Wellbutrin for a week.  Will restart it. Managing chronic work stress. Not sleeping enough and doesn't think Valium 15 mg for 2 weeks is helping with EMA 4 AM with 7 hours good night.  Time change made it worse.  No SE.  Normal sleep 8 hours. Typical is 6 hours.  Work 8-10 hours daily. Down to 1 cup coffee am. Plan: Trial switch back to clonazepam1 mg HS for EMA  10/29/20 appt noted: Awaking 1-2 times per week but less EMA than before.  Tolerating it fine.  No handogover. Less tired daytime. 6-7 hours of sleep. Patient reports stable mood and denies depressed or irritable moods.  Patient denies any recent difficulty with anxiety.  Not anxious for awhile.. Denies appetite disturbance.  Patient reports that energy and motivation have been good.  Patient denies any difficulty with concentration.  Patient denies any suicidal ideation. No med changes  04/29/21 appt noted: Still on Wellbutrin 300 and clonazepam 1 mg HS. Doing fine with a lot of stress at work but last week should be end with new project.   Sleep fine  with clonazepam 6-7 hours with no awakening.  No hangover effects. No SE. Patient reports stable mood and denies depressed or irritable moods.  Patient denies any recent difficulty with anxiety.  Patient denies difficulty with sleep initiation or maintenance. Denies appetite disturbance.  Patient reports that energy and motivation have been good.  Patient denies any difficulty with concentration.  Patient denies any suicidal ideation. Stress eating working from home. 9 gkids 61yo to 61 yo Plan no med changes  03/05/2022 appointment noted: Still on  Wellbutrin 300, clonazepam 1 mg HS and hydroxyzine prn itching under stress. Doesn't know.why this happens itching in hands and bottom of feet.  Only once every few months. Still pleased with Wellbutrin.  Normal sadness for what she's going through.  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 Good function at work. No SE.   Past Psychiatric Medication Trials: Temazepam, clonazepam, Ambien no resp, Flexeril, diazepam 15 Lost resp, remote trazodone NR Abilify 10,  lithium, lamotrigine,  bupropion 450 tremor, Failed attempt to DC Wellbutrin.Marland Kitchen buspirone 30 twice daily, venlafaxine hypomania, fluoxetine,  Adderall, Vyvanse, [place]keo,   Review of Systems:  Review of Systems  Constitutional:  Positive for appetite change and unexpected weight change.  Musculoskeletal:  Positive for arthralgias.  Neurological:  Negative for tremors and weakness.  Psychiatric/Behavioral:  Negative for agitation, behavioral problems, confusion, decreased concentration, dysphoric mood, hallucinations, self-injury, sleep disturbance and suicidal ideas. The patient is not nervous/anxious and is not hyperactive.     Medications: I have reviewed the patient's current medications.  Current Outpatient Medications  Medication Sig Dispense Refill   alendronate (FOSAMAX) 70 MG tablet Take 1 tablet (70 mg total) by mouth once a week. Take with a full glass of water on an empty stomach. 12 tablet 3   Calcium-Vitamin D-Vitamin K (CHEWABLE CALCIUM PO) Take by mouth.     Cholecalciferol (VITAMIN D3 GUMMIES ADULT PO) Take by mouth.     hydrOXYzine (ATARAX) 10 MG tablet 1-2 tablets 3 times daily as needed for itching 100 tablet 0   Multiple Vitamin (MULTIVITAMIN) tablet Take 1 tablet by mouth daily.     buPROPion (WELLBUTRIN XL) 300 MG 24 hr tablet Take 1 tablet (300 mg total) by mouth daily. 90 tablet 2   clonazePAM (KLONOPIN) 1 MG tablet Take 1 tablet (1 mg total) by mouth at bedtime. 90 tablet 3   No  current facility-administered medications for this visit.    Medication Side Effects: None  Allergies:  Allergies  Allergen Reactions   Morphine And Related Itching    Past Medical History:  Diagnosis Date   Anxiety    Asthma    as a child, early adult...none now   BRCA negative 06/2013   BRCA I & II negative   Breast cancer ([place]) 10/28/12   right breast   Colonic mass 07/28/2011   ascending colon and found a polyp 2.5 cm adenoma - hemicoloectomy   Depression    GERD (gastroesophageal reflux disease)    occas  at  night....twice a  month--otc meds   Recurrent upper respiratory infection (URI)    started 1/28.....getting better   S/P chemotherapy, time since greater than 12 weeks     Family History  Problem Relation Age of Onset   Heart disease Father    Cancer Paternal Aunt        unknown form of cancer   Lung cancer Maternal Grandmother    Testicular cancer Cousin        paternal cousin;  died in 76s   Pancreatic cancer Cousin 74   Colon cancer Other        paternal grandmother's sister   Cancer Other        MGM's sister    Social History   Socioeconomic History   Marital status: Married    Spouse name: [name]   Number of children: 2   Years of education: Not on file   Highest education level: Not on file  Occupational History    Employer: [employer]  Tobacco Use   Smoking status: Never   Smokeless tobacco: Never  Substance and Sexual Activity   Alcohol use: Yes    Comment: wine occasionally   Drug use: No   Sexual activity: Yes    Birth control/protection: Surgical  Other Topics Concern   Not on file  Social History Narrative   Not on file   Social Determinants of Health   Financial Resource Strain: Not on file  Food Insecurity: Not on file  Transportation Needs: Not on file  Physical Activity: Not on file  Stress: Not on file  Social Connections: Not on file  Intimate Partner Violence: Not on file    Past Medical History, Surgical history,  Social history, and Family history were reviewed and updated as appropriate.   Please see review of systems for further details on the patient's review from today.   Objective:   Physical Exam:  There were no vitals taken for this visit.  Physical Exam Constitutional:      General: She is not in acute distress.    Appearance: She is well-developed.  Musculoskeletal:        General: No deformity.  Neurological:     Mental Status: She is alert and oriented to person, place, and time.     Motor: No tremor.     Coordination: Coordination normal.     Gait: Gait normal.  Psychiatric:        Attention and Perception: She is attentive.        Mood and Affect: Mood is not anxious or depressed. Affect is not labile, blunt, angry or inappropriate.        Speech: Speech normal. Speech is not slurred.        Behavior: Behavior normal.        Thought Content: Thought content normal. Thought content is not delusional. Thought content does not include homicidal or suicidal ideation. Thought content does not include suicidal plan.        Cognition and Memory: Cognition normal.        Judgment: Judgment normal.     Comments: Insight intact. No auditory or visual hallucinations. No delusions.  SX controlled.     Lab Review:     Component Value Date/Time   NA 142 03/03/2022 0824   NA 139 02/09/2017 0911   K 3.6 03/03/2022 0824   K 4.0 02/09/2017 0911   CL 106 03/03/2022 0824   CL 102 11/03/2012 0821   CO2 25 03/03/2022 0824   CO2 26 02/09/2017 0911   GLUCOSE 80 03/03/2022 0824   GLUCOSE 104 02/09/2017 0911   GLUCOSE 93 11/03/2012 0821   BUN 16 03/03/2022 0824   BUN 15.5 02/09/2017 0911   CREATININE 0.89 03/03/2022 0824   CREATININE 0.9 02/09/2017 0911   CALCIUM 10.3 03/03/2022 0824   CALCIUM 9.9 02/09/2017 0911   PROT 7.1 03/03/2022 0824   PROT 7.4 02/09/2017 0911   ALBUMIN 4.5 03/03/2022 0824   ALBUMIN 4.1 02/09/2017 0911  AST 18 03/03/2022 0824   AST 17 02/09/2017 0911    ALT 18 03/03/2022 0824   ALT 14 02/09/2017 0911   ALKPHOS 48 03/03/2022 0824   ALKPHOS 60 02/09/2017 0911   BILITOT 0.4 03/03/2022 0824   BILITOT 0.45 02/09/2017 0911   GFRNONAA >60 03/03/2022 0824   GFRAA >60 03/08/2020 0835       Component Value Date/Time   WBC 5.5 03/03/2022 0824   WBC 6.7 02/24/2019 0939   RBC 4.53 03/03/2022 0824   HGB 13.0 03/03/2022 0824   HGB 12.6 02/09/2017 0911   HCT 39.4 03/03/2022 0824   HCT 38.8 02/09/2017 0911   PLT 295 03/03/2022 0824   PLT 295 02/09/2017 0911   MCV 87.0 03/03/2022 0824   MCV 87.4 02/09/2017 0911   MCH 28.7 03/03/2022 0824   MCHC 33.0 03/03/2022 0824   RDW 13.6 03/03/2022 0824   RDW 14.2 02/09/2017 0911   LYMPHSABS 1.7 03/03/2022 0824   LYMPHSABS 1.6 02/09/2017 0911   MONOABS 0.4 03/03/2022 0824   MONOABS 0.5 02/09/2017 0911   EOSABS 0.2 03/03/2022 0824   EOSABS 0.1 02/09/2017 0911   BASOSABS 0.0 03/03/2022 0824   BASOSABS 0.1 02/09/2017 0911    No results found for: "POCLITH", "LITHIUM"   No results found for: "PHENYTOIN", "PHENOBARB", "VALPROATE", "CBMZ"   .res Assessment: Plan:    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  Failed attempt to DC Wellbutrin therefore no incation to change Continue Wellbutrin XL 300 mg AM  Call if there is any recurrence of depression.  Disc timing of antidepressants and effects and SE.  Sleep hygiene discussed.   Sleep is worse with a lot of stress going on.  We discussed the short-term risks associated with benzodiazepines including sedation and increased fall risk among others.  Discussed long-term side effect risk including dependence,  potential withdrawal symptoms, and the potential eventual dose-related risk of dementia.  But recent studies from 2020 dispute this association between benzodiazepines and dementia risk. Newer studies in 2020 do not support an association with dementia. clonazepam1 mg HS for EMA Option increase.  Sleep hygiene.  Explained mechanism of stress, histamines, and usefulness of hydroxyzine.  Addressed her fear of the hydroxyzine. Extensive discussion of how this can happen with bodily stress too like cold weather. Use prn.   No changes  She agrees to the plan  Follow-up  9 mos  Lynder Parents MD, DFAPA   Please see After Visit Summary for patient specific instructions.  Future Appointments  Date Time Provider [place]  03/04/2023  8:15 AM Gardenia Phlegm, NP [facility] None  03/04/2023  9:00 AM [facility] INFUSION [facility] None    No orders of the defined types were placed in this encounter.     -------------------------------

## 2022-03-06 DIAGNOSIS — F4320 Adjustment disorder, unspecified: Secondary | ICD-10-CM | POA: Diagnosis not present

## 2022-03-07 DIAGNOSIS — Z1231 Encounter for screening mammogram for malignant neoplasm of breast: Secondary | ICD-10-CM | POA: Diagnosis not present

## 2022-03-07 DIAGNOSIS — M8588 Other specified disorders of bone density and structure, other site: Secondary | ICD-10-CM | POA: Diagnosis not present

## 2022-03-07 DIAGNOSIS — M810 Age-related osteoporosis without current pathological fracture: Secondary | ICD-10-CM | POA: Diagnosis not present

## 2022-03-10 ENCOUNTER — Encounter: Payer: Self-pay | Admitting: Hematology and Oncology

## 2022-03-20 DIAGNOSIS — M810 Age-related osteoporosis without current pathological fracture: Secondary | ICD-10-CM | POA: Diagnosis not present

## 2022-04-03 DIAGNOSIS — Z23 Encounter for immunization: Secondary | ICD-10-CM | POA: Diagnosis not present

## 2022-07-02 DIAGNOSIS — M170 Bilateral primary osteoarthritis of knee: Secondary | ICD-10-CM | POA: Diagnosis not present

## 2022-08-18 DIAGNOSIS — M170 Bilateral primary osteoarthritis of knee: Secondary | ICD-10-CM | POA: Diagnosis not present

## 2022-09-01 DIAGNOSIS — M170 Bilateral primary osteoarthritis of knee: Secondary | ICD-10-CM | POA: Diagnosis not present

## 2022-09-09 DIAGNOSIS — M170 Bilateral primary osteoarthritis of knee: Secondary | ICD-10-CM | POA: Diagnosis not present

## 2022-09-16 DIAGNOSIS — M170 Bilateral primary osteoarthritis of knee: Secondary | ICD-10-CM | POA: Diagnosis not present

## 2022-09-19 IMAGING — CR DG WRIST COMPLETE 3+V*R*
4 series · 4 of 4 positions shown · non-contrast
Comparison: None.

CLINICAL DATA: Right wrist pain after fall

EXAM:
RIGHT WRIST - COMPLETE 3+ VIEW

[x wrist pa right]
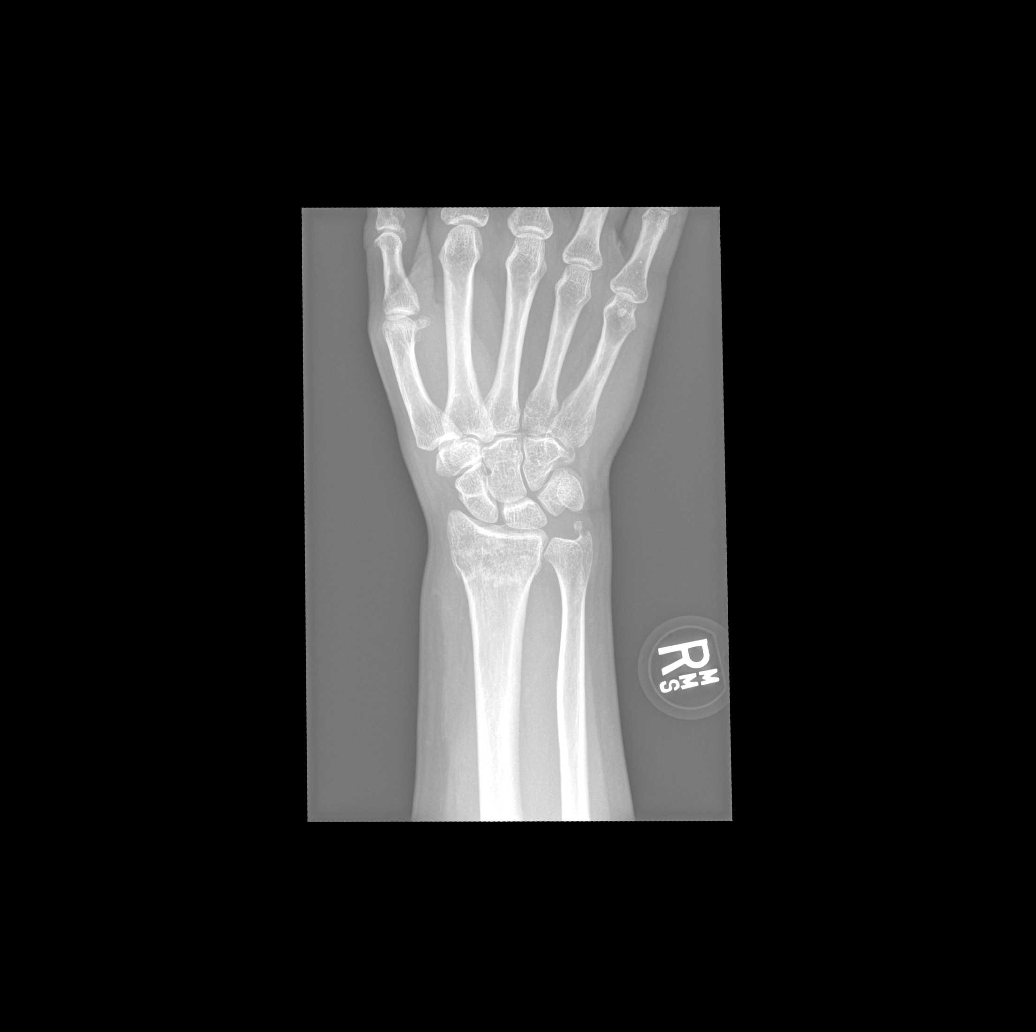

[x wrist obl right]
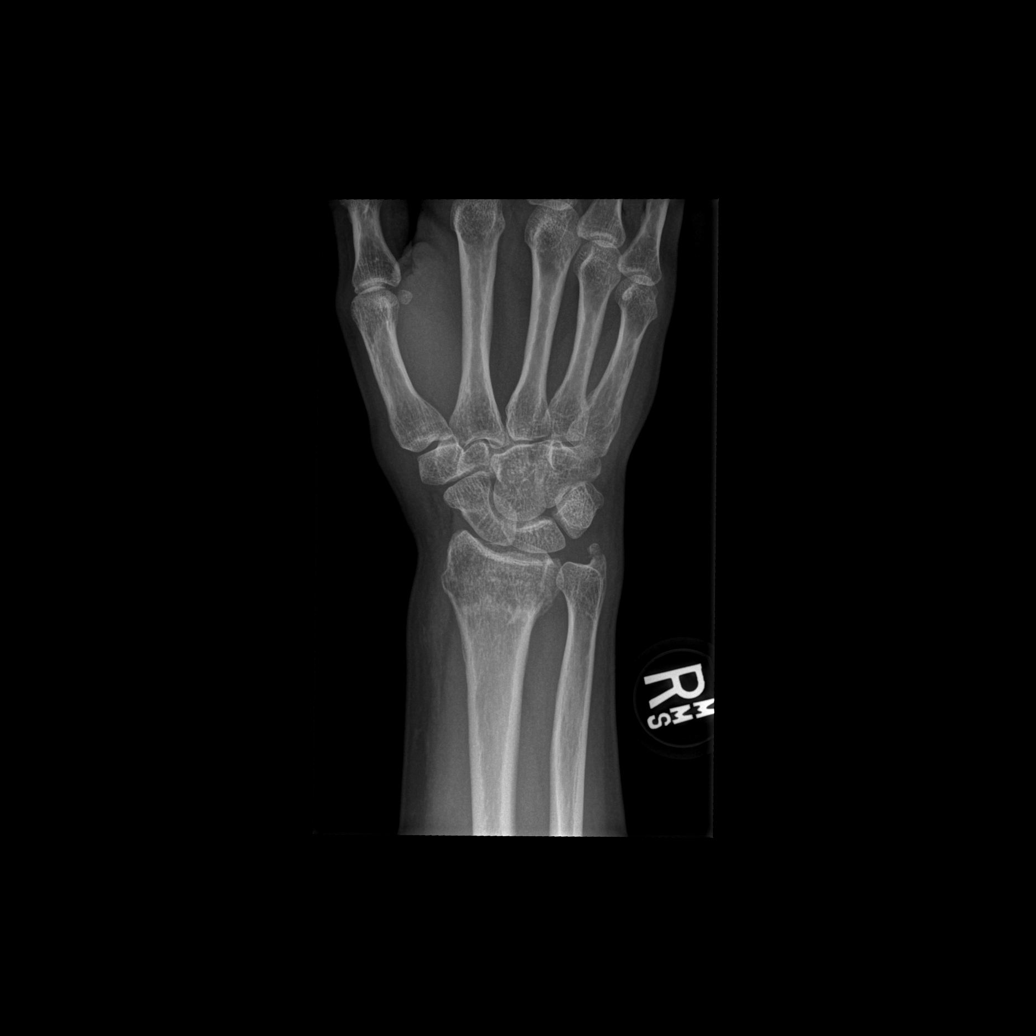

[x wrist navicular view right]
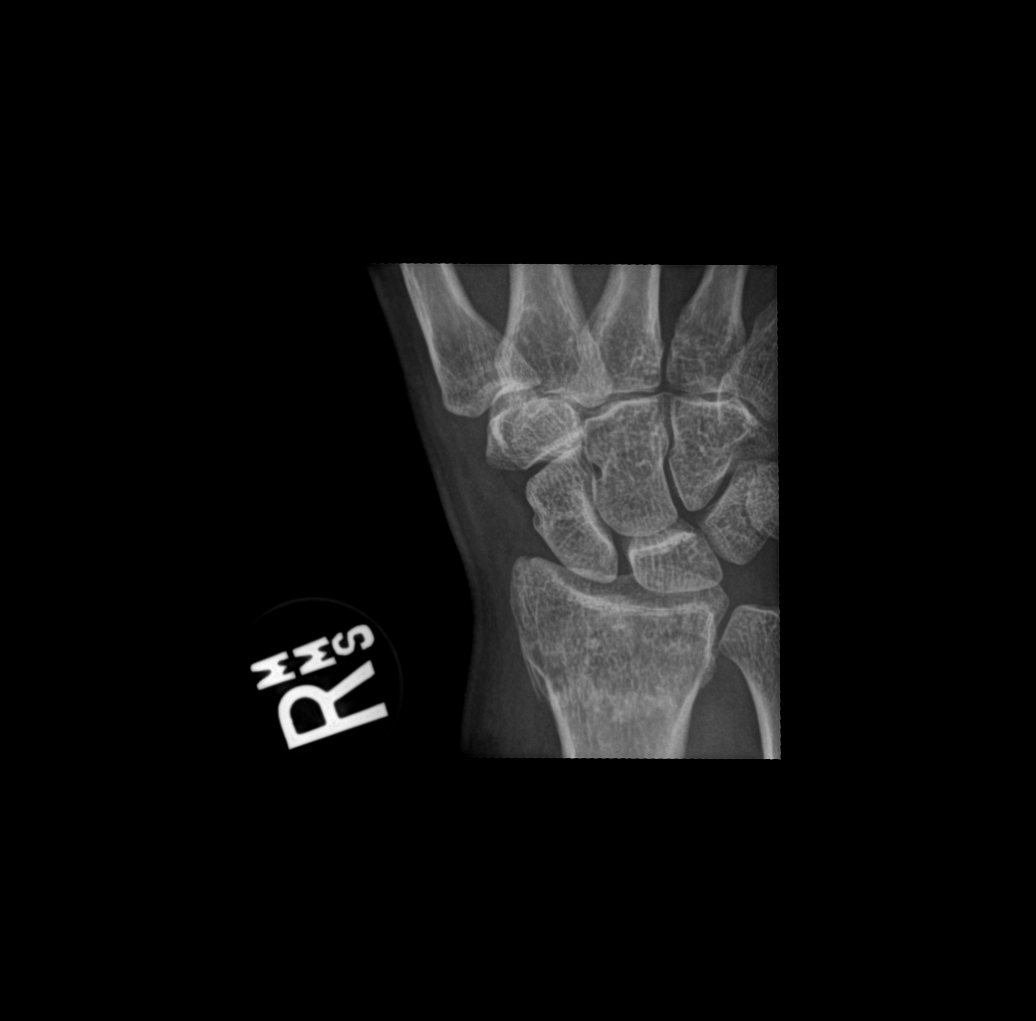

[x wrist lat right]
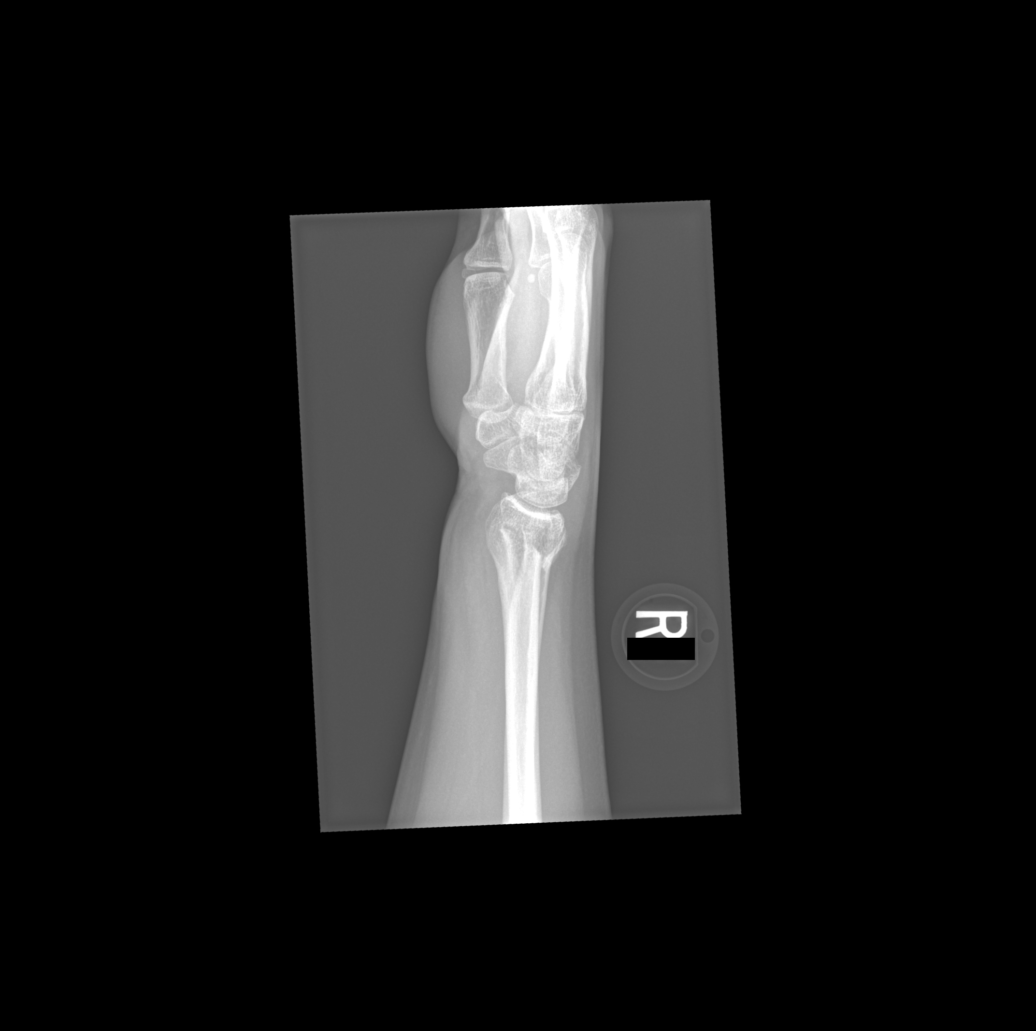

[4 of 4 positions shown; findings below may reference images not displayed]

FINDINGS: Acute fracture of the distal radial metaphysis with mild impaction
and slight dorsal angulation. No definite intra-articular extension
to the radiocarpal joint is evident. Minimally displaced fracture
through the tip of the ulnar styloid. Carpal bones and carpal
intraosseous spaces are maintained. No dislocation. Soft tissue
swelling at the wrist.
IMPRESSION: 1. Acute fracture of the distal radial metaphysis with mild
impaction and slight dorsal angulation.
2. Minimally displaced fracture through the tip of the ulnar
styloid.

## 2022-09-23 DIAGNOSIS — M170 Bilateral primary osteoarthritis of knee: Secondary | ICD-10-CM | POA: Diagnosis not present

## 2022-09-30 DIAGNOSIS — M170 Bilateral primary osteoarthritis of knee: Secondary | ICD-10-CM | POA: Diagnosis not present

## 2022-10-02 DIAGNOSIS — G4733 Obstructive sleep apnea (adult) (pediatric): Secondary | ICD-10-CM | POA: Diagnosis not present

## 2022-10-02 DIAGNOSIS — F331 Major depressive disorder, recurrent, moderate: Secondary | ICD-10-CM | POA: Diagnosis not present

## 2022-10-02 DIAGNOSIS — E7800 Pure hypercholesterolemia, unspecified: Secondary | ICD-10-CM | POA: Diagnosis not present

## 2022-10-02 DIAGNOSIS — M818 Other osteoporosis without current pathological fracture: Secondary | ICD-10-CM | POA: Diagnosis not present

## 2022-10-02 DIAGNOSIS — Z0000 Encounter for general adult medical examination without abnormal findings: Secondary | ICD-10-CM | POA: Diagnosis not present

## 2022-11-06 DIAGNOSIS — F4322 Adjustment disorder with anxiety: Secondary | ICD-10-CM | POA: Diagnosis not present

## 2022-11-27 DIAGNOSIS — F4320 Adjustment disorder, unspecified: Secondary | ICD-10-CM | POA: Diagnosis not present

## 2022-12-04 ENCOUNTER — Ambulatory Visit: Payer: BC Managed Care – PPO | Admitting: Psychiatry

## 2022-12-04 ENCOUNTER — Telehealth (INDEPENDENT_AMBULATORY_CARE_PROVIDER_SITE_OTHER): Payer: BC Managed Care – PPO | Admitting: Psychiatry

## 2022-12-04 ENCOUNTER — Encounter: Payer: Self-pay | Admitting: Psychiatry

## 2022-12-04 DIAGNOSIS — F3342 Major depressive disorder, recurrent, in full remission: Secondary | ICD-10-CM | POA: Diagnosis not present

## 2022-12-04 DIAGNOSIS — F5105 Insomnia due to other mental disorder: Secondary | ICD-10-CM

## 2022-12-04 DIAGNOSIS — R251 Tremor, unspecified: Secondary | ICD-10-CM

## 2022-12-04 DIAGNOSIS — F900 Attention-deficit hyperactivity disorder, predominantly inattentive type: Secondary | ICD-10-CM

## 2022-12-04 DIAGNOSIS — F411 Generalized anxiety disorder: Secondary | ICD-10-CM

## 2022-12-04 NOTE — Progress Notes (Signed)
[name] [id number] 1961/03/05 62 y.o.   Video Visit via My Chart  I connected with pt by video using My Chart and verified that I am speaking with the correct person using two identifiers.   I discussed the limitations, risks, security and privacy concerns of performing an evaluation and management service by My Chart  and the availability of in person appointments. I also discussed with the patient that there may be a patient responsible charge related to this service. The patient expressed understanding and agreed to proceed.  I discussed the assessment and treatment plan with the patient. The patient was provided an opportunity to ask questions and all were answered. The patient agreed with the plan and demonstrated an understanding of the instructions.   The patient was advised to call back or seek an in-person evaluation if the symptoms worsen or if the condition fails to improve as anticipated.  I provided 30 minutes of video time during this encounter.  The patient was located at home and the provider was located office. Session [id number]  Subjective:   Patient ID:  [name] is a 62 y.o. (DOB 02-Jan-1961) female.  Chief Complaint:  Chief Complaint  Patient presents with   Follow-up   Depression   Sleeping Problem    Depression        Associated symptoms include appetite change.  Associated symptoms include no decreased concentration and no suicidal ideas.  Past medical history includes anxiety.   Anxiety Patient reports no confusion, decreased concentration, nervous/anxious behavior or suicidal ideas.     [name] presents to the office today for follow-up of depression and ADD.  seen August 23, 2018.  She had reduced the Abilify to 5 mg and was doing well.  She wanted to try to taper off of that medication.   visit June 2020.  Reduced abilify to 2 mg daily without any problems.   visit October 2020.  She was still doing well and she wanted to  try discontinuing the Abilify which was reasonable so it was stopped.  January 2021 appointment with the following noted: No meds were changed. Healthy.  No problem off the Abilify.  Depression is under control.  Very stressful last 2 weeks which created some anxiety.  Still working long hours. Stopped Vyvanse bc didn't feel she needed it any longer about 3 weeks ago.  Tremor resolved.  No worse depression.  Other stressors have occurred related to the marriage.  Anxiety is still manageable.  Sleep 7 hours until worse last 2 weeks with work demands.  Sometimes goes to bed to late.  Work from home and a lot. 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  Mother living with her until July.   Sleep OK usually.  Diazepam mainly used for sleep and it helps with Flexeril. Wants to eventually try reducing Wellbutrin to see if tremor is better.  Dating and feels better.  January 04, 2020 appointment with the following noted: Still doing fine with mood.  Both depression and anxiety managed.  Wants to reduce Wellbutrin to 150 mg daily. Diazepam about once weekly for sleep.  Usually stress work related. No med changes  08/29/2020 appointment with the following noted: More depression when tapered off Wellbutrin.  More sadness and resolved back on the Wellbutrin and not depressed now.   Started vitamin B6 which looked Wellbutrin and not sure if she accidentally missed Wellbutrin for a week.  Will restart it. Managing chronic work stress. Not  sleeping enough and doesn't think Valium 15 mg for 2 weeks is helping with EMA 4 AM with 7 hours good night.  Time change made it worse.  No SE.  Normal sleep 8 hours. Typical is 6 hours.  Work 8-10 hours daily. Down to 1 cup coffee am. Plan: Trial switch back to clonazepam1 mg HS for EMA  10/29/20 appt noted: Awaking 1-2 times per week but less EMA than before.  Tolerating it fine.  No handogover. Less tired daytime. 6-7 hours of sleep. Patient reports stable mood and  denies depressed or irritable moods.  Patient denies any recent difficulty with anxiety.  Not anxious for awhile.. Denies appetite disturbance.  Patient reports that energy and motivation have been good.  Patient denies any difficulty with concentration.  Patient denies any suicidal ideation. No med changes  04/29/21 appt noted: Still on Wellbutrin 300 and clonazepam 1 mg HS. Doing fine with a lot of stress at work but last week should be end with new project.   Sleep fine with clonazepam 6-7 hours with no awakening.  No hangover effects. No SE. Patient reports stable mood and denies depressed or irritable moods.  Patient denies any recent difficulty with anxiety.  Patient denies difficulty with sleep initiation or maintenance. Denies appetite disturbance.  Patient reports that energy and motivation have been good.  Patient denies any difficulty with concentration.  Patient denies any suicidal ideation. Stress eating working from home. 9 gkids 11yo to 62 yo Plan no med changes  03/05/2022 appointment noted: Still on Wellbutrin 300, clonazepam 1 mg HS and hydroxyzine prn itching under stress. Doesn't know.why this happens itching in hands and bottom of feet.  Only once every few months. Still pleased with Wellbutrin.  Normal sadness for what she's going through.  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 Good function at work. No SE.  12/04/22 appt noted: Occ clonazepam but needs 1/5 mg HS.   Still on Wellbutrin 300, prn clonazepam 1.5 mg HS and hydroxyzine prn itching under stress. No sig dep but about to hit another major family crisis.  Continue s counseling. No SE problems unless tremor.  It seems worse.  Can interfere. 1 cup coffee per day. Does ot want to reduce Wellbutrin.     Past Psychiatric Medication Trials: Temazepam, clonazepam, Ambien no resp, Flexeril, diazepam 15 Lost resp, remote trazodone NR Abilify 10,  lithium, lamotrigine,  bupropion 450 tremor,  Failed attempt to DC Wellbutrin.Marland Kitchen buspirone 30 twice daily, venlafaxine hypomania, fluoxetine,  Adderall, Vyvanse, Evekeo,   Review of Systems:  Review of Systems  Constitutional:  Positive for appetite change and unexpected weight change.  Musculoskeletal:  Positive for arthralgias.  Neurological:  Positive for tremors and weakness.  Psychiatric/Behavioral:  Negative for agitation, behavioral problems, confusion, decreased concentration, dysphoric mood, hallucinations, self-injury, sleep disturbance and suicidal ideas. The patient is not nervous/anxious and is not hyperactive.     Medications: I have reviewed the patient's current medications.  Current Outpatient Medications  Medication Sig Dispense Refill   alendronate (FOSAMAX) 70 MG tablet Take 1 tablet (70 mg total) by mouth once a week. Take with a full glass of water on an empty stomach. 12 tablet 3   buPROPion (WELLBUTRIN XL) 300 MG 24 hr tablet Take 1 tablet (300 mg total) by mouth daily. 90 tablet 2   Calcium-Vitamin D-Vitamin K (CHEWABLE CALCIUM PO) Take by mouth.     Cholecalciferol (VITAMIN D3 GUMMIES ADULT PO) Take by mouth.     clonazePAM (  KLONOPIN) 1 MG tablet Take 1 tablet (1 mg total) by mouth at bedtime. 90 tablet 3   hydrOXYzine (ATARAX) 10 MG tablet 1-2 tablets 3 times daily as needed for itching 100 tablet 0   Multiple Vitamin (MULTIVITAMIN) tablet Take 1 tablet by mouth daily.     No current facility-administered medications for this visit.    Medication Side Effects: None  Allergies:  Allergies  Allergen Reactions   Morphine And Codeine Itching    Past Medical History:  Diagnosis Date   Anxiety    Asthma    as a child, early adult...none now   BRCA negative 06/2013   BRCA I & II negative   Breast cancer (HCC) 10/28/12   right breast   Colonic mass 07/28/2011   ascending colon and found a polyp 2.5 cm adenoma - hemicoloectomy   Depression    GERD (gastroesophageal reflux disease)    occas  at   night....twice a  month--otc meds   Recurrent upper respiratory infection (URI)    started 1/28.....getting better   S/P chemotherapy, time since greater than 12 weeks     Family History  Problem Relation Age of Onset   Heart disease Father    Cancer Paternal Aunt        unknown form of cancer   Lung cancer Maternal Grandmother    Testicular cancer Cousin        paternal cousin; died in 68s   Pancreatic cancer Cousin 68   Colon cancer Other        paternal grandmother's sister   Cancer Other        MGM's sister    Social History   Socioeconomic History   Marital status: Married    Spouse name: [name]   Number of children: 2   Years of education: Not on file   Highest education level: Not on file  Occupational History    Employer: [employer]  Tobacco Use   Smoking status: Never   Smokeless tobacco: Never  Substance and Sexual Activity   Alcohol use: Yes    Comment: wine occasionally   Drug use: No   Sexual activity: Yes    Birth control/protection: Surgical  Other Topics Concern   Not on file  Social History Narrative   Not on file   Social Determinants of Health   Financial Resource Strain: Not on file  Food Insecurity: Not on file  Transportation Needs: Not on file  Physical Activity: Not on file  Stress: Not on file  Social Connections: Not on file  Intimate Partner Violence: Not on file    Past Medical History, Surgical history, Social history, and Family history were reviewed and updated as appropriate.   Please see review of systems for further details on the patient's review from today.   Objective:   Physical Exam:  There were no vitals taken for this visit.  Physical Exam Neurological:     Mental Status: She is alert and oriented to person, place, and time.     Cranial Nerves: No dysarthria.  Psychiatric:        Attention and Perception: Attention and perception normal.        Mood and Affect: Mood normal.        Speech: Speech normal.         Behavior: Behavior is cooperative.        Thought Content: Thought content normal. Thought content is not paranoid or delusional. Thought content does not include homicidal or  suicidal ideation. Thought content does not include suicidal plan.        Cognition and Memory: Cognition and memory normal.        Judgment: Judgment normal.     Comments: Insight intact Some stress with family but managing so far     Lab Review:     Component Value Date/Time   NA 142 03/03/2022 0824   NA 139 02/09/2017 0911   K 3.6 03/03/2022 0824   K 4.0 02/09/2017 0911   CL 106 03/03/2022 0824   CL 102 11/03/2012 0821   CO2 25 03/03/2022 0824   CO2 26 02/09/2017 0911   GLUCOSE 80 03/03/2022 0824   GLUCOSE 104 02/09/2017 0911   GLUCOSE 93 11/03/2012 0821   BUN 16 03/03/2022 0824   BUN 15.5 02/09/2017 0911   CREATININE 0.89 03/03/2022 0824   CREATININE 0.9 02/09/2017 0911   CALCIUM 10.3 03/03/2022 0824   CALCIUM 9.9 02/09/2017 0911   PROT 7.1 03/03/2022 0824   PROT 7.4 02/09/2017 0911   ALBUMIN 4.5 03/03/2022 0824   ALBUMIN 4.1 02/09/2017 0911   AST 18 03/03/2022 0824   AST 17 02/09/2017 0911   ALT 18 03/03/2022 0824   ALT 14 02/09/2017 0911   ALKPHOS 48 03/03/2022 0824   ALKPHOS 60 02/09/2017 0911   BILITOT 0.4 03/03/2022 0824   BILITOT 0.45 02/09/2017 0911   GFRNONAA >60 03/03/2022 0824   GFRAA >60 03/08/2020 0835       Component Value Date/Time   WBC 5.5 03/03/2022 0824   WBC 6.7 02/24/2019 0939   RBC 4.53 03/03/2022 0824   HGB 13.0 03/03/2022 0824   HGB 12.6 02/09/2017 0911   HCT 39.4 03/03/2022 0824   HCT 38.8 02/09/2017 0911   PLT 295 03/03/2022 0824   PLT 295 02/09/2017 0911   MCV 87.0 03/03/2022 0824   MCV 87.4 02/09/2017 0911   MCH 28.7 03/03/2022 0824   MCHC 33.0 03/03/2022 0824   RDW 13.6 03/03/2022 0824   RDW 14.2 02/09/2017 0911   LYMPHSABS 1.7 03/03/2022 0824   LYMPHSABS 1.6 02/09/2017 0911   MONOABS 0.4 03/03/2022 0824   MONOABS 0.5 02/09/2017 0911   EOSABS 0.2  03/03/2022 0824   EOSABS 0.1 02/09/2017 0911   BASOSABS 0.0 03/03/2022 0824   BASOSABS 0.1 02/09/2017 0911    No results found for: "POCLITH", "LITHIUM"   No results found for: "PHENYTOIN", "PHENOBARB", "VALPROATE", "CBMZ"   .res Assessment: Plan:    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  Failed attempt to DC Wellbutrin therefore no incation to change.  Disc it could be contributing to tremor. Continue Wellbutrin XL 300 mg AM.  She doesn't want to reduce.   Call if there is any recurrence of depression.  Disc timing of antidepressants and effects and SE.  Sleep hygiene discussed.   Sleep is worse with a lot of stress going on.  We discussed the short-term risks associated with benzodiazepines including sedation and increased fall risk among others.  Discussed long-term side effect risk including dependence, potential withdrawal symptoms, and the potential eventual dose-related risk of dementia.  But recent studies from 2020 dispute this association between benzodiazepines and dementia risk. Newer studies in 2020 do not support an association with dementia. Clonazepam prn 1.5 mg HS  for EMA Option increase.  Sleep hygiene.  Explained mechanism of stress, histamines, and usefulness of hydroxyzine.  Addressed her fear of the hydroxyzine. Extensive discussion of how this can happen with bodily stress too like cold weather. Use prn.   Rec see neuro bc weakness with tremor.  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    No changes  She agrees to the plan  Follow-up  9 mos  Meredith Staggers MD, DFAPA   Please see After Visit Summary for patient specific instructions.  Future  Appointments  Date Time Provider Department Center  03/04/2023  8:15 AM Loa Socks, NP [facility] None  03/04/2023  9:00 AM [facility] INFUSION [facility] None    No orders of the defined types were placed in this encounter.     -------------------------------

## 2022-12-17 DIAGNOSIS — F4322 Adjustment disorder with anxiety: Secondary | ICD-10-CM | POA: Diagnosis not present

## 2023-01-01 DIAGNOSIS — F4320 Adjustment disorder, unspecified: Secondary | ICD-10-CM | POA: Diagnosis not present

## 2023-01-03 ENCOUNTER — Other Ambulatory Visit: Payer: Self-pay | Admitting: Psychiatry

## 2023-01-03 DIAGNOSIS — F3342 Major depressive disorder, recurrent, in full remission: Secondary | ICD-10-CM

## 2023-01-14 DIAGNOSIS — F4322 Adjustment disorder with anxiety: Secondary | ICD-10-CM | POA: Diagnosis not present

## 2023-02-12 DIAGNOSIS — F4320 Adjustment disorder, unspecified: Secondary | ICD-10-CM | POA: Diagnosis not present

## 2023-02-20 DIAGNOSIS — M170 Bilateral primary osteoarthritis of knee: Secondary | ICD-10-CM | POA: Diagnosis not present

## 2023-03-03 ENCOUNTER — Other Ambulatory Visit: Payer: Self-pay

## 2023-03-03 DIAGNOSIS — M818 Other osteoporosis without current pathological fracture: Secondary | ICD-10-CM

## 2023-03-04 ENCOUNTER — Inpatient Hospital Stay: Payer: BC Managed Care – PPO

## 2023-03-04 ENCOUNTER — Inpatient Hospital Stay: Payer: BC Managed Care – PPO | Attending: Adult Health | Admitting: Adult Health

## 2023-03-04 ENCOUNTER — Encounter: Payer: Self-pay | Admitting: Adult Health

## 2023-03-04 ENCOUNTER — Encounter: Payer: Self-pay | Admitting: Oncology

## 2023-03-04 VITALS — BP 123/66 | HR 18 | Temp 97.9°F | Resp 18 | Ht 63.0 in | Wt 150.8 lb

## 2023-03-04 DIAGNOSIS — K219 Gastro-esophageal reflux disease without esophagitis: Secondary | ICD-10-CM | POA: Insufficient documentation

## 2023-03-04 DIAGNOSIS — Z923 Personal history of irradiation: Secondary | ICD-10-CM | POA: Insufficient documentation

## 2023-03-04 DIAGNOSIS — M818 Other osteoporosis without current pathological fracture: Secondary | ICD-10-CM | POA: Diagnosis not present

## 2023-03-04 DIAGNOSIS — Z800 Family history of malignant neoplasm of digestive organs: Secondary | ICD-10-CM | POA: Diagnosis not present

## 2023-03-04 DIAGNOSIS — C50311 Malignant neoplasm of lower-inner quadrant of right female breast: Secondary | ICD-10-CM | POA: Diagnosis not present

## 2023-03-04 DIAGNOSIS — Z9221 Personal history of antineoplastic chemotherapy: Secondary | ICD-10-CM | POA: Diagnosis not present

## 2023-03-04 DIAGNOSIS — K5900 Constipation, unspecified: Secondary | ICD-10-CM | POA: Insufficient documentation

## 2023-03-04 DIAGNOSIS — M810 Age-related osteoporosis without current pathological fracture: Secondary | ICD-10-CM | POA: Diagnosis not present

## 2023-03-04 DIAGNOSIS — Z170 Estrogen receptor positive status [ER+]: Secondary | ICD-10-CM | POA: Diagnosis not present

## 2023-03-04 LAB — CBC WITH DIFFERENTIAL (CANCER CENTER ONLY)
Abs Immature Granulocytes: 0.02 10*3/uL (ref 0.00–0.07)
Basophils Absolute: 0.1 10*3/uL (ref 0.0–0.1)
Basophils Relative: 1 %
Eosinophils Absolute: 0.2 10*3/uL (ref 0.0–0.5)
Eosinophils Relative: 3 %
HCT: 39.4 % (ref 36.0–46.0)
Hemoglobin: 12.8 g/dL (ref 12.0–15.0)
Immature Granulocytes: 0 %
Lymphocytes Relative: 24 %
Lymphs Abs: 1.8 10*3/uL (ref 0.7–4.0)
MCH: 28.6 pg (ref 26.0–34.0)
MCHC: 32.5 g/dL (ref 30.0–36.0)
MCV: 87.9 fL (ref 80.0–100.0)
Monocytes Absolute: 0.6 10*3/uL (ref 0.1–1.0)
Monocytes Relative: 8 %
Neutro Abs: 5.1 10*3/uL (ref 1.7–7.7)
Neutrophils Relative %: 64 %
Platelet Count: 325 10*3/uL (ref 150–400)
RBC: 4.48 MIL/uL (ref 3.87–5.11)
RDW: 13.4 % (ref 11.5–15.5)
WBC Count: 7.8 10*3/uL (ref 4.0–10.5)
nRBC: 0 % (ref 0.0–0.2)

## 2023-03-04 LAB — CMP (CANCER CENTER ONLY)
ALT: 15 U/L (ref 0–44)
AST: 14 U/L — ABNORMAL LOW (ref 15–41)
Albumin: 4.1 g/dL (ref 3.5–5.0)
Alkaline Phosphatase: 59 U/L (ref 38–126)
Anion gap: 6 (ref 5–15)
BUN: 11 mg/dL (ref 8–23)
CO2: 26 mmol/L (ref 22–32)
Calcium: 9.2 mg/dL (ref 8.9–10.3)
Chloride: 108 mmol/L (ref 98–111)
Creatinine: 0.9 mg/dL (ref 0.44–1.00)
GFR, Estimated: >60 mL/min (ref >60)
Glucose, Bld: 88 mg/dL (ref 70–99)
Potassium: 3.9 mmol/L (ref 3.5–5.1)
Sodium: 140 mmol/L (ref 135–145)
Total Bilirubin: 0.3 mg/dL (ref 0.3–1.2)
Total Protein: 6.9 g/dL (ref 6.5–8.1)

## 2023-03-04 MED ORDER — ZOLEDRONIC ACID 4 MG/100ML IV SOLN
4.0000 mg | Freq: Once | INTRAVENOUS | Status: AC
  Administered 2023-03-04: 4 mg via INTRAVENOUS
  Filled 2023-03-04: qty 100

## 2023-03-04 MED ORDER — SODIUM CHLORIDE 0.9 % IV SOLN: INTRAVENOUS | Status: DC

## 2023-03-04 NOTE — Patient Instructions (Addendum)

## 2023-03-04 NOTE — Patient Instructions (Signed)
Continue with annual mammograms and talk to your PCP about your bone health and management of osteoporosis moving forward.  Your next bone density is due 03/10/2024.   Bone Health Bones protect organs, store calcium, anchor muscles, and support the whole body. Keeping your bones strong is important, especially as you get older. You can take actions to help keep your bones strong and healthy. Why is keeping my bones healthy important?  Keeping your bones healthy is important because your body constantly replaces bone cells. Cells get old, and new cells take their place. As we age, we lose bone cells because the body may not be able to make enough new cells to replace the old cells. The amount of bone cells and bone tissue you have is referred to as bone mass. The higher your bone mass, the stronger your bones. The aging process leads to an overall loss of bone mass in the body, which can increase the likelihood of: Broken bones. A condition in which the bones become weak and brittle (osteoporosis). A large decline in bone mass occurs in older adults. In women, it occurs about the time of menopause. What actions can I take to keep my bones healthy? Good health habits are important for maintaining healthy bones. This includes eating nutritious foods and exercising regularly. To have healthy bones, you need to get enough of the right minerals and vitamins. Most nutrition experts recommend getting these nutrients from the foods that you eat. In some cases, taking supplements may also be recommended. Doing certain types of exercise is also important for bone health. What are the nutritional recommendations for healthy bones?  Eating a well-balanced diet with plenty of calcium and vitamin D will help to protect your bones. Nutritional recommendations vary from person to person. Ask your health care provider what is healthy for you. Here are some general guidelines. Get enough calcium Calcium is the most  important (essential) mineral for bone health. Most people can get enough calcium from their diet, but supplements may be recommended for people who are at risk for osteoporosis. Good sources of calcium include: Dairy products, such as low-fat or nonfat milk, cheese, and yogurt. Dark green leafy vegetables, such as bok choy and broccoli. Foods that have calcium added to them (are fortified). Foods that may be fortified with calcium include orange juice, cereal, bread, soy beverages, and tofu products. Nuts, such as almonds. Follow these recommended amounts for daily calcium intake: Infants, 0-6 months: 200 mg. Infants, 6-12 months: 260 mg. Children, age 73-3: 700 mg. Children, age 51-8: 1,000 mg. Children, age 81-13: 1,300 mg. Teens, age 94-18: 1,300 mg. Adults, age 76-50: 1,000 mg. Adults, age 37-70: Men: 1,000 mg. Women: 1,200 mg. Adults, age 732 or older: 1,200 mg. Pregnant and breastfeeding females: Teens: 1,300 mg. Adults: 1,000 mg. Get enough vitamin D Vitamin D is the most essential vitamin for bone health. It helps the body absorb calcium. Sunlight stimulates the skin to make vitamin D, so be sure to get enough sunlight. If you live in a cold climate or you do not get outside often, your health care provider may recommend that you take vitamin D supplements. Good sources of vitamin D in your diet include: Egg yolks. Saltwater fish. Milk and cereal fortified with vitamin D. Follow these recommended amounts for daily vitamin D intake: Infants, 0-12 months: 400 international units (IU). Children and teens, age 73-18: 600 international units. Adults, age 22 or younger: 600 international units. Adults, age 513 or older: 600-1,000  international units. Get other important nutrients Other nutrients that are important for bone health include: Phosphorus. This mineral is found in meat, poultry, dairy foods, nuts, and legumes. The recommended daily intake for adult men and adult women is 700  mg. Magnesium. This mineral is found in seeds, nuts, dark green vegetables, and legumes. The recommended daily intake for adult men is 400-420 mg. For adult women, it is 310-320 mg. Vitamin K. This vitamin is found in green leafy vegetables. The recommended daily intake is 120 mcg for adult men and 90 mcg for adult women. What type of physical activity is best for building and maintaining healthy bones? Weight-bearing and strength-building activities are important for building and maintaining healthy bones. Weight-bearing activities cause muscles and bones to work against gravity. Strength-building activities increase the strength of the muscles that support bones. Weight-bearing and muscle-building activities include: Walking and hiking. Jogging and running. Dancing. Gym exercises. Lifting weights. Tennis and racquetball. Climbing stairs. Aerobics. Adults should get at least 30 minutes of moderate physical activity on most days. Children should get at least 60 minutes of moderate physical activity on most days. Ask your health care provider what type of exercise is best for you. How can I find out if my bone mass is low? Bone mass can be measured with an X-ray test called a bone mineral density (BMD) test. This test is recommended for all women who are age 32 or older. It may also be recommended for: Men who are age 90 or older. People who are at risk for osteoporosis because of: Having a long-term disease that weakens bones, such as kidney disease or rheumatoid arthritis. Having menopause earlier than normal. Taking medicine that weakens bones, such as steroids, thyroid hormones, or hormone treatment for breast cancer or prostate cancer. Smoking. Drinking three or more alcoholic drinks a day. Being underweight. Sedentary lifestyle. If you find that you have a low bone mass, you may be able to prevent osteoporosis or further bone loss by changing your diet and lifestyle. Where can I find  more information? Bone Health & Osteoporosis Foundation: https://carlson-fletcher.info/ [employer]: www.bones.http://www.myers.net/ International Osteoporosis Foundation: Investment banker, operational.iofbonehealth.org Summary The aging process leads to an overall loss of bone mass in the body, which can increase the likelihood of broken bones and osteoporosis. Eating a well-balanced diet with plenty of calcium and vitamin D will help to protect your bones. Weight-bearing and strength-building activities are also important for building and maintaining strong bones. Bone mass can be measured with an X-ray test called a bone mineral density (BMD) test. This information is not intended to replace advice given to you by your health care provider. Make sure you discuss any questions you have with your health care provider. Document Revised: 11/14/2020 Document Reviewed: 11/14/2020 Elsevier Patient Education  2024 [employer].

## 2023-03-06 NOTE — Progress Notes (Signed)
[facility] Cancer Follow up:    Elizabeth Brunette, MD [phone] [address] [place] [address]   DIAGNOSIS:  Cancer Staging  Malignant neoplasm of lower-inner quadrant of right breast of female, estrogen receptor positive (HCC) Staging form: Breast, AJCC 7th Edition - Clinical: Stage IA (T1c, N0, cM0) - Signed by Lowella Dell, MD on 08/11/2013 Specimen type: Core Needle Biopsy Histopathologic type: 9931 Laterality: Right Staging comments: Staged at breast conference 5.21.14  - Pathologic: No stage assigned - Unsigned Specimen type: Core Needle Biopsy Histopathologic type: 9931 Laterality: Right   SUMMARY OF ONCOLOGIC HISTORY: Oncology History  Malignant neoplasm of lower-inner quadrant of right breast of female, estrogen receptor positive (HCC)  10/28/2012 Initial Diagnosis   Clinical T1CN0 stage Ia grade 2 IDC ER/PR positive HER2 negative Ki-67 20%    11/17/2012 Surgery   Right lumpectomy: T2N0 stage IIa IDC with lobular features, grade 2 ER/PR positive HER2 negative Ki-67 32%, Oncotype DX score 33: Risk of distant recurrence 23%    12/30/2012 - 03/03/2013 Adjuvant Chemotherapy   Taxotere and Cytoxan x 4    - 05/10/2013 Radiation Therapy   Completed adjuvant radiation   06/2013 - 02/2018 Anti-estrogen oral therapy   Tamoxifen started January 2015 stopped in March 2015 due to depression switched to anastrozole 11/12/2013 discontinued 02/24/2014 due to arthralgias and myalgias, switched to exemestane November 2015 and then switched to letrozole 05/16/2015 stopped 03/05/2018 due to osteoporosis      CURRENT THERAPY: Zometa  INTERVAL HISTORY: [name] 62 y.o. female returns for f/u and evaluation prior to receiving Zometa.  She tolerates this annually well and this is her last year that she has planned to receive this with [place].  Her most recent mammogram occurred on 03/07/2022 demonstrating no mammographic evidence of malignancy and breast density  category D.    Dr. Pamelia Hoit recommended breast MRI due to her breast density at her last visit which she did not proceed with.  She also underwent bone density testing on 03/07/2022 that demonstrated osteoporosis with t score of -2.9 in the left femoral neck.   [name] denies any breast changes or concerns today.  She has no new issues.     Patient Active Problem List   Diagnosis Date Noted   ADD (attention deficit disorder) 05/10/2018   GAD (generalized anxiety disorder) 05/10/2018   Osteoporosis 08/10/2017   Arthritis 06/06/2015   Depression 10/10/2013   Malignant neoplasm of lower-inner quadrant of right breast of female, estrogen receptor positive (HCC) 03/15/2013   Constipation 02/17/2013   GERD (gastroesophageal reflux disease) 01/27/2013   Anemia, unspecified 01/27/2013    is allergic to morphine and codeine.  MEDICAL HISTORY: Past Medical History:  Diagnosis Date   Anxiety    Asthma    as a child, early adult...none now   BRCA negative 06/2013   BRCA I & II negative   Breast cancer (HCC) 10/28/12   right breast   Colonic mass 07/28/2011   ascending colon and found a polyp 2.5 cm adenoma - hemicoloectomy   Depression    GERD (gastroesophageal reflux disease)    occas  at  night....twice a  month--otc meds   Recurrent upper respiratory infection (URI)    started 1/28.....getting better   S/P chemotherapy, time since greater than 12 weeks     SURGICAL HISTORY: Past Surgical History:  Procedure Laterality Date   APPENDECTOMY     BREAST BIOPSY Right 10/28/12   Invasive mammary ca, ER/PR=+, Her2  Neu-   ENDOMETRIAL ABLATION  2006   HTA   HEMICOLECTOMY  07/28/11   TUBAL LIGATION  2006    SOCIAL HISTORY: Social History   Socioeconomic History   Marital status: Married    Spouse name: [name]   Number of children: 2   Years of education: Not on file   Highest education level: Not on file  Occupational History    Employer: [employer]  Tobacco Use   Smoking status:  Never   Smokeless tobacco: Never  Substance and Sexual Activity   Alcohol use: Yes    Comment: wine occasionally   Drug use: No   Sexual activity: Yes    Birth control/protection: Surgical  Other Topics Concern   Not on file  Social History Narrative   Not on file   Social Determinants of Health   Financial Resource Strain: Not on file  Food Insecurity: Not on file  Transportation Needs: Not on file  Physical Activity: Not on file  Stress: Not on file  Social Connections: Not on file  Intimate Partner Violence: Not on file    FAMILY HISTORY: Family History  Problem Relation Age of Onset   Heart disease Father    Cancer Paternal Aunt        unknown form of cancer   Lung cancer Maternal Grandmother    Testicular cancer Cousin        paternal cousin; died in 6s   Pancreatic cancer Cousin 35   Colon cancer Other        paternal grandmother's sister   Cancer Other        MGM's sister    Review of Systems  Constitutional:  Negative for appetite change, chills, fatigue, fever and unexpected weight change.  HENT:   Negative for hearing loss, lump/mass and trouble swallowing.   Eyes:  Negative for eye problems and icterus.  Respiratory:  Negative for chest tightness, cough and shortness of breath.   Cardiovascular:  Negative for chest pain, leg swelling and palpitations.  Gastrointestinal:  Negative for abdominal distention, abdominal pain, constipation, diarrhea, nausea and vomiting.  Endocrine: Negative for hot flashes.  Genitourinary:  Negative for difficulty urinating.   Musculoskeletal:  Negative for arthralgias.  Skin:  Negative for itching and rash.  Neurological:  Negative for dizziness, extremity weakness, headaches and numbness.  Hematological:  Negative for adenopathy. Does not bruise/bleed easily.  Psychiatric/Behavioral:  Negative for depression. The patient is not nervous/anxious.       PHYSICAL EXAMINATION    Vitals:   03/04/23 0822  BP: 123/66   Pulse: (!) 18  Resp: 18  Temp: 97.9 F (36.6 C)  SpO2: 100%    Physical Exam Constitutional:      General: She is not in acute distress.    Appearance: Normal appearance. She is not toxic-appearing.  HENT:     Head: Normocephalic and atraumatic.     Mouth/Throat:     Mouth: Mucous membranes are moist.     Pharynx: Oropharynx is clear. No oropharyngeal exudate or posterior oropharyngeal erythema.  Eyes:     General: No scleral icterus. Cardiovascular:     Rate and Rhythm: Normal rate and regular rhythm.     Pulses: Normal pulses.     Heart sounds: Normal heart sounds.  Pulmonary:     Effort: Pulmonary effort is normal.     Breath sounds: Normal breath sounds.  Chest:     Comments: Right breast s/p lumpectomy and radiation, no sign of local recurrence,  left breast benign Abdominal:     General: Abdomen is flat. Bowel sounds are normal. There is no distension.     Palpations: Abdomen is soft.     Tenderness: There is no abdominal tenderness.  Musculoskeletal:        General: No swelling.     Cervical back: Neck supple.  Lymphadenopathy:     Cervical: No cervical adenopathy.  Skin:    General: Skin is warm and dry.     Findings: No rash.  Neurological:     General: No focal deficit present.     Mental Status: She is alert.  Psychiatric:        Mood and Affect: Mood normal.        Behavior: Behavior normal.     LABORATORY DATA:  CBC    Component Value Date/Time   WBC 7.8 03/04/2023 0802   WBC 6.7 02/24/2019 0939   RBC 4.48 03/04/2023 0802   HGB 12.8 03/04/2023 0802   HGB 12.6 02/09/2017 0911   HCT 39.4 03/04/2023 0802   HCT 38.8 02/09/2017 0911   PLT 325 03/04/2023 0802   PLT 295 02/09/2017 0911   MCV 87.9 03/04/2023 0802   MCV 87.4 02/09/2017 0911   MCH 28.6 03/04/2023 0802   MCHC 32.5 03/04/2023 0802   RDW 13.4 03/04/2023 0802   RDW 14.2 02/09/2017 0911   LYMPHSABS 1.8 03/04/2023 0802   LYMPHSABS 1.6 02/09/2017 0911   MONOABS 0.6 03/04/2023 0802    MONOABS 0.5 02/09/2017 0911   EOSABS 0.2 03/04/2023 0802   EOSABS 0.1 02/09/2017 0911   BASOSABS 0.1 03/04/2023 0802   BASOSABS 0.1 02/09/2017 0911    CMP     Component Value Date/Time   NA 140 03/04/2023 0802   NA 139 02/09/2017 0911   K 3.9 03/04/2023 0802   K 4.0 02/09/2017 0911   CL 108 03/04/2023 0802   CL 102 11/03/2012 0821   CO2 26 03/04/2023 0802   CO2 26 02/09/2017 0911   GLUCOSE 88 03/04/2023 0802   GLUCOSE 104 02/09/2017 0911   GLUCOSE 93 11/03/2012 0821   BUN 11 03/04/2023 0802   BUN 15.5 02/09/2017 0911   CREATININE 0.90 03/04/2023 0802   CREATININE 0.9 02/09/2017 0911   CALCIUM 9.2 03/04/2023 0802   CALCIUM 9.9 02/09/2017 0911   PROT 6.9 03/04/2023 0802   PROT 7.4 02/09/2017 0911   ALBUMIN 4.1 03/04/2023 0802   ALBUMIN 4.1 02/09/2017 0911   AST 14 (L) 03/04/2023 0802   AST 17 02/09/2017 0911   ALT 15 03/04/2023 0802   ALT 14 02/09/2017 0911   ALKPHOS 59 03/04/2023 0802   ALKPHOS 60 02/09/2017 0911   BILITOT 0.3 03/04/2023 0802   BILITOT 0.45 02/09/2017 0911   GFRNONAA >60 03/04/2023 0802   GFRAA >60 03/08/2020 0835       ASSESSMENT and THERAPY PLAN:   Malignant neoplasm of lower-inner quadrant of right breast of female, estrogen receptor positive (HCC) 10/28/2012: Clinical T1CN0 stage Ia grade 2 IDC ER/PR positive HER2 negative Ki-67 20% 11/17/2012: Right lumpectomy: T2N0 stage IIa IDC with lobular features, grade 2 ER/PR positive HER2 negative Ki-67 32%, Oncotype DX score 33: Risk of distant recurrence 23% 12/30/2012: Taxotere and Cytoxan x4 completed 03/03/2013 05/10/2013: Adjuvant radiation  Antiestrogen treatment summary: Tamoxifen started January 2015 stopped in March 2015 due to depression switched to anastrozole 11/12/2013 discontinued 02/24/2014 due to arthralgias and myalgias, switched to exemestane November 2015 and then switched to letrozole 05/16/2015 stopped 03/05/2018 due to osteoporosis  Breast cancer surveillance: 1.  Breast exam  03/04/2023: Benign 2. mammogram 03/03/2022: Benign 3.  Opted to forego breast MRI  She is 10 years out from her diagnosis and can graduate from f/u with [place].  I recommended healthy diet, exercise, weight bearing exercises, and RTC PRN.     Osteoporosis Most recent bone density shows osteoporosis.  Receiving Zometa today.  I recommended she f/u with her PCP about continued osteoporosis management since she is graduating from oncologic follow up today.    If she wishes to return here for management moving forward she will let us know.    All questions were answered. The patient knows to call the clinic with any problems, questions or concerns. We can certainly see the patient much sooner if necessary.  Total encounter time:30 minutes*in face-to-face visit time, chart review, lab review, care coordination, order entry, and documentation of the encounter time.  Lillard Anes, NP 03/06/23 2:21 PM Medical Oncology and Hematology [facility] [address] [place], [place] [address] Tel. [phone]    Fax. [phone]  *Total Encounter Time as defined by the Centers for Medicare and Medicaid Services includes, in addition to the face-to-face time of a patient visit (documented in the note above) non-face-to-face time: obtaining and reviewing outside history, ordering and reviewing medications, tests or procedures, care coordination (communications with other health care professionals or caregivers) and documentation in the medical record.

## 2023-03-06 NOTE — Assessment & Plan Note (Signed)
Most recent bone density shows osteoporosis.  Receiving Zometa today.  I recommended she f/u with her PCP about continued osteoporosis management since she is graduating from oncologic follow up today.    If she wishes to return here for management moving forward she will let us know.

## 2023-03-06 NOTE — Assessment & Plan Note (Addendum)
10/28/2012: Clinical T1CN0 stage Ia grade 2 IDC ER/PR positive HER2 negative Ki-67 20% 11/17/2012: Right lumpectomy: T2N0 stage IIa IDC with lobular features, grade 2 ER/PR positive HER2 negative Ki-67 32%, Oncotype DX score 33: Risk of distant recurrence 23% 12/30/2012: Taxotere and Cytoxan x4 completed 03/03/2013 05/10/2013: Adjuvant radiation  Antiestrogen treatment summary: Tamoxifen started January 2015 stopped in March 2015 due to depression switched to anastrozole 11/12/2013 discontinued 02/24/2014 due to arthralgias and myalgias, switched to exemestane November 2015 and then switched to letrozole 05/16/2015 stopped 03/05/2018 due to osteoporosis  Breast cancer surveillance: 1.  Breast exam 03/04/2023: Benign 2. mammogram 03/03/2022: Benign 3.  Opted to forego breast MRI  She is 10 years out from her diagnosis and can graduate from f/u with [place].  I recommended healthy diet, exercise, weight bearing exercises, and RTC PRN.

## 2023-03-09 DIAGNOSIS — Z1231 Encounter for screening mammogram for malignant neoplasm of breast: Secondary | ICD-10-CM | POA: Diagnosis not present

## 2023-03-11 ENCOUNTER — Encounter: Payer: Self-pay | Admitting: Hematology and Oncology

## 2023-03-19 DIAGNOSIS — F4320 Adjustment disorder, unspecified: Secondary | ICD-10-CM | POA: Diagnosis not present

## 2023-03-29 ENCOUNTER — Other Ambulatory Visit: Payer: Self-pay | Admitting: Psychiatry

## 2023-03-29 DIAGNOSIS — F3342 Major depressive disorder, recurrent, in full remission: Secondary | ICD-10-CM

## 2023-03-30 NOTE — Telephone Encounter (Signed)
Rf appropriate lv 03/06/23; nv unscheduled rtc 9 months; lf 01/03/23

## 2023-04-15 DIAGNOSIS — Z23 Encounter for immunization: Secondary | ICD-10-CM | POA: Diagnosis not present

## 2023-05-19 ENCOUNTER — Other Ambulatory Visit: Payer: Self-pay | Admitting: Psychiatry

## 2023-05-19 DIAGNOSIS — F5105 Insomnia due to other mental disorder: Secondary | ICD-10-CM

## 2023-06-25 DIAGNOSIS — J0110 Acute frontal sinusitis, unspecified: Secondary | ICD-10-CM | POA: Diagnosis not present

## 2023-07-10 ENCOUNTER — Other Ambulatory Visit: Payer: Self-pay | Admitting: Psychiatry

## 2023-07-10 DIAGNOSIS — F3342 Major depressive disorder, recurrent, in full remission: Secondary | ICD-10-CM

## 2023-07-10 NOTE — Telephone Encounter (Signed)
Call to sched

## 2023-07-10 NOTE — Telephone Encounter (Signed)
Please schedule pt an appt

## 2023-07-17 ENCOUNTER — Other Ambulatory Visit: Payer: Self-pay

## 2023-07-17 DIAGNOSIS — F3342 Major depressive disorder, recurrent, in full remission: Secondary | ICD-10-CM

## 2023-07-17 MED ORDER — BUPROPION HCL ER (XL) 300 MG PO TB24
300.0000 mg | ORAL_TABLET | Freq: Every day | ORAL | 0 refills | Status: DC
Start: 2023-07-17 — End: 2023-09-23

## 2023-07-17 NOTE — Telephone Encounter (Signed)
Pt has app 4/9

## 2023-08-10 DIAGNOSIS — M170 Bilateral primary osteoarthritis of knee: Secondary | ICD-10-CM | POA: Diagnosis not present

## 2023-09-02 ENCOUNTER — Other Ambulatory Visit: Payer: Self-pay | Admitting: Psychiatry

## 2023-09-02 DIAGNOSIS — F5105 Insomnia due to other mental disorder: Secondary | ICD-10-CM

## 2023-09-02 NOTE — Telephone Encounter (Signed)
 Verify dose  LV:  Clonazepam prn 1.5 mg HS for EMA

## 2023-09-23 ENCOUNTER — Encounter: Payer: Self-pay | Admitting: Psychiatry

## 2023-09-23 ENCOUNTER — Telehealth: Payer: BC Managed Care – PPO | Admitting: Psychiatry

## 2023-09-23 DIAGNOSIS — F411 Generalized anxiety disorder: Secondary | ICD-10-CM | POA: Diagnosis not present

## 2023-09-23 DIAGNOSIS — F900 Attention-deficit hyperactivity disorder, predominantly inattentive type: Secondary | ICD-10-CM

## 2023-09-23 DIAGNOSIS — F5105 Insomnia due to other mental disorder: Secondary | ICD-10-CM

## 2023-09-23 DIAGNOSIS — F3342 Major depressive disorder, recurrent, in full remission: Secondary | ICD-10-CM

## 2023-09-23 MED ORDER — BUPROPION HCL ER (XL) 300 MG PO TB24
300.0000 mg | ORAL_TABLET | Freq: Every day | ORAL | 1 refills | Status: DC
Start: 2023-09-23 — End: 2024-04-11

## 2023-09-23 NOTE — Progress Notes (Signed)
 [name] [id number] June 01, 1961 63 y.o.   Video Visit via My Chart  I connected with pt by video using My Chart and verified that I am speaking with the correct person using two identifiers.   I discussed the limitations, risks, security and privacy concerns of performing an evaluation and management service by My Chart  and the availability of in person appointments. I also discussed with the patient that there may be a patient responsible charge related to this service. The patient expressed understanding and agreed to proceed.  I discussed the assessment and treatment plan with the patient. The patient was provided an opportunity to ask questions and all were answered. The patient agreed with the plan and demonstrated an understanding of the instructions.   The patient was advised to call back or seek an in-person evaluation if the symptoms worsen or if the condition fails to improve as anticipated.  I provided 30 minutes of video time during this encounter.  The patient was located at home and the provider was located office. Session [id number]  Subjective:   Patient ID:  [name] is a 63 y.o. (DOB 12/24/1960) female.  Chief Complaint:  Chief Complaint  Patient presents with   Follow-up   Depression   Sleeping Problem    Depression        Associated symptoms include appetite change.  Associated symptoms include no decreased concentration and no suicidal ideas.  Past medical history includes anxiety.   Anxiety Patient reports no confusion, decreased concentration, nervous/anxious behavior or suicidal ideas.     [name] presents to the office today for follow-up of depression and ADD.  seen August 23, 2018.  She had reduced the Abilify to 5 mg and was doing well.  She wanted to try to taper off of that medication.   visit June 2020.  Reduced abilify to 2 mg daily without any problems.   visit October 2020.  She was still doing well and she wanted to  try discontinuing the Abilify which was reasonable so it was stopped.  January 2021 appointment with the following noted: No meds were changed. Healthy.  No problem off the Abilify.  Depression is under control.  Very stressful last 2 weeks which created some anxiety.  Still working long hours. Stopped Vyvanse bc didn't feel she needed it any longer about 3 weeks ago.  Tremor resolved.  No worse depression.  Other stressors have occurred related to the marriage.  Anxiety is still manageable.  Sleep 7 hours until worse last 2 weeks with work demands.  Sometimes goes to bed to late.  Work from home and a lot. 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  Mother living with her until July.   Sleep OK usually.  Diazepam mainly used for sleep and it helps with Flexeril. Wants to eventually try reducing Wellbutrin to see if tremor is better.  Dating and feels better.  January 04, 2020 appointment with the following noted: Still doing fine with mood.  Both depression and anxiety managed.  Wants to reduce Wellbutrin to 150 mg daily. Diazepam about once weekly for sleep.  Usually stress work related. No med changes  08/29/2020 appointment with the following noted: More depression when tapered off Wellbutrin.  More sadness and resolved back on the Wellbutrin and not depressed now.   Started vitamin B6 which looked Wellbutrin and not sure if she accidentally missed Wellbutrin for a week.  Will restart it. Managing chronic work stress. Not  sleeping enough and doesn't think Valium 15 mg for 2 weeks is helping with EMA 4 AM with 7 hours good night.  Time change made it worse.  No SE.  Normal sleep 8 hours. Typical is 6 hours.  Work 8-10 hours daily. Down to 1 cup coffee am. Plan: Trial switch back to clonazepam1 mg HS for EMA  10/29/20 appt noted: Awaking 1-2 times per week but less EMA than before.  Tolerating it fine.  No handogover. Less tired daytime. 6-7 hours of sleep. Patient reports stable mood and  denies depressed or irritable moods.  Patient denies any recent difficulty with anxiety.  Not anxious for awhile.. Denies appetite disturbance.  Patient reports that energy and motivation have been good.  Patient denies any difficulty with concentration.  Patient denies any suicidal ideation. No med changes  04/29/21 appt noted: Still on Wellbutrin 300 and clonazepam 1 mg HS. Doing fine with a lot of stress at work but last week should be end with new project.   Sleep fine with clonazepam 6-7 hours with no awakening.  No hangover effects. No SE. Patient reports stable mood and denies depressed or irritable moods.  Patient denies any recent difficulty with anxiety.  Patient denies difficulty with sleep initiation or maintenance. Denies appetite disturbance.  Patient reports that energy and motivation have been good.  Patient denies any difficulty with concentration.  Patient denies any suicidal ideation. Stress eating working from home. 9 gkids 11yo to 63 yo Plan no med changes  03/05/2022 appointment noted: Still on Wellbutrin 300, clonazepam 1 mg HS and hydroxyzine prn itching under stress. Doesn't know.why this happens itching in hands and bottom of feet.  Only once every few months. Still pleased with Wellbutrin.  Normal sadness for what she's going through.  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 Good function at work. No SE.  12/04/22 appt noted: Occ clonazepam but needs 1/5 mg HS.   Still on Wellbutrin 300, prn clonazepam 1.5 mg HS and hydroxyzine prn itching under stress. No sig dep but about to hit another major family crisis.  Continue s counseling. No SE problems unless tremor.  It seems worse.  Can interfere. 1 cup coffee per day. Does ot want to reduce Wellbutrin.    09/23/23 appt noted: Med: Wellbutrin 300, prn clonazepam 2 mg HS and hydroxyzine prn itching under stress. No SE. Lower doses don't work of clonazepam.  Only takes every 3 day or so.  She  will try to cut back in the future.  5-6 hours daily. Mood has been good other than concerns over family.  I'm ok with what I have with meds.  Not dep.   Plenty of work and function is good. Pending TKR bc hard to walk.  Sometimes sleep isssues DT knee pain.  Past Psychiatric Medication Trials: Temazepam, clonazepam, Ambien no resp, Flexeril, diazepam 15 Lost resp, remote trazodone NR Abilify 10,  lithium, lamotrigine,  bupropion 450 tremor, Failed attempt to DC Wellbutrin.Marland Kitchen buspirone 30 twice daily, venlafaxine hypomania, fluoxetine,  Adderall, Vyvanse, Evekeo,   Review of Systems:  Review of Systems  Constitutional:  Positive for appetite change and unexpected weight change.  Musculoskeletal:  Positive for arthralgias.  Neurological:  Positive for tremors and weakness.  Psychiatric/Behavioral:  Negative for agitation, behavioral problems, confusion, decreased concentration, dysphoric mood, hallucinations, self-injury, sleep disturbance and suicidal ideas. The patient is not nervous/anxious and is not hyperactive.     Medications: I have reviewed the patient's current medications.  Current  Outpatient Medications  Medication Sig Dispense Refill   Calcium-Vitamin D-Vitamin K (CHEWABLE CALCIUM PO) Take by mouth.     Cholecalciferol (VITAMIN D3 GUMMIES ADULT PO) Take by mouth.     clonazePAM (KLONOPIN) 1 MG tablet Take 1.5 tablets (1.5 mg total) by mouth at bedtime. 135 tablet 0   hydrOXYzine (ATARAX) 10 MG tablet 1-2 tablets 3 times daily as needed for itching 100 tablet 0   Multiple Vitamin (MULTIVITAMIN) tablet Take 1 tablet by mouth daily.     buPROPion (WELLBUTRIN XL) 300 MG 24 hr tablet Take 1 tablet (300 mg total) by mouth daily. 90 tablet 1   No current facility-administered medications for this visit.    Medication Side Effects: None  Allergies:  Allergies  Allergen Reactions   Morphine And Codeine Itching    Past Medical History:  Diagnosis Date   Anxiety    Asthma     as a child, early adult...none now   BRCA negative 06/2013   BRCA I & II negative   Breast cancer (HCC) 10/28/12   right breast   Colonic mass 07/28/2011   ascending colon and found a polyp 2.5 cm adenoma - hemicoloectomy   Depression    GERD (gastroesophageal reflux disease)    occas  at  night....twice a  month--otc meds   Recurrent upper respiratory infection (URI)    started 1/28.....getting better   S/P chemotherapy, time since greater than 12 weeks     Family History  Problem Relation Age of Onset   Heart disease Father    Cancer Paternal Aunt        unknown form of cancer   Lung cancer Maternal Grandmother    Testicular cancer Cousin        paternal cousin; died in 31s   Pancreatic cancer Cousin 46   Colon cancer Other        paternal grandmother's sister   Cancer Other        MGM's sister    Social History   Socioeconomic History   Marital status: Married    Spouse name: [name]   Number of children: 2   Years of education: Not on file   Highest education level: Not on file  Occupational History    Employer: [employer]  Tobacco Use   Smoking status: Never   Smokeless tobacco: Never  Substance and Sexual Activity   Alcohol use: Yes    Comment: wine occasionally   Drug use: No   Sexual activity: Yes    Birth control/protection: Surgical  Other Topics Concern   Not on file  Social History Narrative   Not on file   Social Drivers of Health   Financial Resource Strain: Not on file  Food Insecurity: Not on file  Transportation Needs: Not on file  Physical Activity: Not on file  Stress: Not on file  Social Connections: Not on file  Intimate Partner Violence: Not on file    Past Medical History, Surgical history, Social history, and Family history were reviewed and updated as appropriate.   Please see review of systems for further details on the patient's review from today.   Objective:   Physical Exam:  There were no vitals taken for this  visit.  Physical Exam Neurological:     Mental Status: She is alert and oriented to person, place, and time.     Cranial Nerves: No dysarthria.  Psychiatric:        Attention and Perception: Attention and perception normal.  Mood and Affect: Mood normal. Mood is not anxious or depressed.        Speech: Speech normal.        Behavior: Behavior is cooperative.        Thought Content: Thought content normal. Thought content is not paranoid or delusional. Thought content does not include homicidal or suicidal ideation. Thought content does not include suicidal plan.        Cognition and Memory: Cognition and memory normal.        Judgment: Judgment normal.     Comments: Insight intact Some stress with family but managing so far     Lab Review:     Component Value Date/Time   NA 140 03/04/2023 0802   NA 139 02/09/2017 0911   K 3.9 03/04/2023 0802   K 4.0 02/09/2017 0911   CL 108 03/04/2023 0802   CL 102 11/03/2012 0821   CO2 26 03/04/2023 0802   CO2 26 02/09/2017 0911   GLUCOSE 88 03/04/2023 0802   GLUCOSE 104 02/09/2017 0911   GLUCOSE 93 11/03/2012 0821   BUN 11 03/04/2023 0802   BUN 15.5 02/09/2017 0911   CREATININE 0.90 03/04/2023 0802   CREATININE 0.9 02/09/2017 0911   CALCIUM 9.2 03/04/2023 0802   CALCIUM 9.9 02/09/2017 0911   PROT 6.9 03/04/2023 0802   PROT 7.4 02/09/2017 0911   ALBUMIN 4.1 03/04/2023 0802   ALBUMIN 4.1 02/09/2017 0911   AST 14 (L) 03/04/2023 0802   AST 17 02/09/2017 0911   ALT 15 03/04/2023 0802   ALT 14 02/09/2017 0911   ALKPHOS 59 03/04/2023 0802   ALKPHOS 60 02/09/2017 0911   BILITOT 0.3 03/04/2023 0802   BILITOT 0.45 02/09/2017 0911   GFRNONAA >60 03/04/2023 0802   GFRAA >60 03/08/2020 0835       Component Value Date/Time   WBC 7.8 03/04/2023 0802   WBC 6.7 02/24/2019 0939   RBC 4.48 03/04/2023 0802   HGB 12.8 03/04/2023 0802   HGB 12.6 02/09/2017 0911   HCT 39.4 03/04/2023 0802   HCT 38.8 02/09/2017 0911   PLT 325  03/04/2023 0802   PLT 295 02/09/2017 0911   MCV 87.9 03/04/2023 0802   MCV 87.4 02/09/2017 0911   MCH 28.6 03/04/2023 0802   MCHC 32.5 03/04/2023 0802   RDW 13.4 03/04/2023 0802   RDW 14.2 02/09/2017 0911   LYMPHSABS 1.8 03/04/2023 0802   LYMPHSABS 1.6 02/09/2017 0911   MONOABS 0.6 03/04/2023 0802   MONOABS 0.5 02/09/2017 0911   EOSABS 0.2 03/04/2023 0802   EOSABS 0.1 02/09/2017 0911   BASOSABS 0.1 03/04/2023 0802   BASOSABS 0.1 02/09/2017 0911    No results found for: "POCLITH", "LITHIUM"   No results found for: "PHENYTOIN", "PHENOBARB", "VALPROATE", "CBMZ"   .res Assessment: Plan:    Generalized anxiety disorder  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  Failed attempt to DC Wellbutrin therefore no incation to change.  Disc it could be contributing to tremor. Continue Wellbutrin XL 300 mg AM.  She doesn't want to reduce.   Call if there is any recurrence of depression.  Disc timing of antidepressants and effects and SE.  Sleep hygiene discussed.   Sleep is worse with a lot of stress going  on.  We discussed the short-term risks associated with benzodiazepines including sedation and increased fall risk among others.  Discussed long-term side effect risk including dependence, potential withdrawal symptoms, and the potential eventual dose-related risk of dementia.  But recent studies from 2020 dispute this association between benzodiazepines and dementia risk. Newer studies in 2020 do not support an association with dementia. Clonazepam prn 2 mg HS for EMA.  Don't go higher.  Used only couple times per week. Option increase.  Sleep hygiene.  No changes  She agrees to the plan  Follow-up  9  mos  Meredith Staggers MD, DFAPA   Please see After Visit Summary for patient specific instructions.  No future appointments.   No orders of the defined types were placed in this encounter.     -------------------------------

## 2023-10-05 DIAGNOSIS — M810 Age-related osteoporosis without current pathological fracture: Secondary | ICD-10-CM | POA: Diagnosis not present

## 2023-10-05 DIAGNOSIS — Z01411 Encounter for gynecological examination (general) (routine) with abnormal findings: Secondary | ICD-10-CM | POA: Diagnosis not present

## 2023-10-05 DIAGNOSIS — Z1331 Encounter for screening for depression: Secondary | ICD-10-CM | POA: Diagnosis not present

## 2023-10-15 DIAGNOSIS — Z0000 Encounter for general adult medical examination without abnormal findings: Secondary | ICD-10-CM | POA: Diagnosis not present

## 2023-10-15 DIAGNOSIS — F331 Major depressive disorder, recurrent, moderate: Secondary | ICD-10-CM | POA: Diagnosis not present

## 2023-10-15 DIAGNOSIS — G4733 Obstructive sleep apnea (adult) (pediatric): Secondary | ICD-10-CM | POA: Diagnosis not present

## 2023-10-15 DIAGNOSIS — F900 Attention-deficit hyperactivity disorder, predominantly inattentive type: Secondary | ICD-10-CM | POA: Diagnosis not present

## 2023-10-15 DIAGNOSIS — E7800 Pure hypercholesterolemia, unspecified: Secondary | ICD-10-CM | POA: Diagnosis not present

## 2023-11-02 DIAGNOSIS — R632 Polyphagia: Secondary | ICD-10-CM | POA: Diagnosis not present

## 2023-11-02 DIAGNOSIS — M818 Other osteoporosis without current pathological fracture: Secondary | ICD-10-CM | POA: Diagnosis not present

## 2023-11-02 DIAGNOSIS — E782 Mixed hyperlipidemia: Secondary | ICD-10-CM | POA: Diagnosis not present

## 2023-11-02 DIAGNOSIS — G4733 Obstructive sleep apnea (adult) (pediatric): Secondary | ICD-10-CM | POA: Diagnosis not present

## 2023-11-02 DIAGNOSIS — F331 Major depressive disorder, recurrent, moderate: Secondary | ICD-10-CM | POA: Diagnosis not present

## 2023-11-02 DIAGNOSIS — R635 Abnormal weight gain: Secondary | ICD-10-CM | POA: Diagnosis not present

## 2023-11-02 DIAGNOSIS — Z131 Encounter for screening for diabetes mellitus: Secondary | ICD-10-CM | POA: Diagnosis not present

## 2023-11-18 DIAGNOSIS — R632 Polyphagia: Secondary | ICD-10-CM | POA: Diagnosis not present

## 2023-11-18 DIAGNOSIS — M818 Other osteoporosis without current pathological fracture: Secondary | ICD-10-CM | POA: Diagnosis not present

## 2023-11-18 DIAGNOSIS — G4733 Obstructive sleep apnea (adult) (pediatric): Secondary | ICD-10-CM | POA: Diagnosis not present

## 2023-11-18 DIAGNOSIS — E782 Mixed hyperlipidemia: Secondary | ICD-10-CM | POA: Diagnosis not present

## 2023-11-30 DIAGNOSIS — R632 Polyphagia: Secondary | ICD-10-CM | POA: Diagnosis not present

## 2023-11-30 DIAGNOSIS — G4733 Obstructive sleep apnea (adult) (pediatric): Secondary | ICD-10-CM | POA: Diagnosis not present

## 2023-11-30 DIAGNOSIS — E782 Mixed hyperlipidemia: Secondary | ICD-10-CM | POA: Diagnosis not present

## 2023-11-30 DIAGNOSIS — M818 Other osteoporosis without current pathological fracture: Secondary | ICD-10-CM | POA: Diagnosis not present

## 2023-12-22 DIAGNOSIS — T7840XA Allergy, unspecified, initial encounter: Secondary | ICD-10-CM | POA: Diagnosis not present

## 2023-12-22 DIAGNOSIS — R220 Localized swelling, mass and lump, head: Secondary | ICD-10-CM | POA: Diagnosis not present

## 2023-12-23 DIAGNOSIS — M170 Bilateral primary osteoarthritis of knee: Secondary | ICD-10-CM | POA: Diagnosis not present

## 2023-12-25 DIAGNOSIS — Z860101 Personal history of adenomatous and serrated colon polyps: Secondary | ICD-10-CM | POA: Diagnosis not present

## 2023-12-25 DIAGNOSIS — Z980 Intestinal bypass and anastomosis status: Secondary | ICD-10-CM | POA: Diagnosis not present

## 2023-12-25 DIAGNOSIS — Z09 Encounter for follow-up examination after completed treatment for conditions other than malignant neoplasm: Secondary | ICD-10-CM | POA: Diagnosis not present

## 2024-03-18 DIAGNOSIS — Z1231 Encounter for screening mammogram for malignant neoplasm of breast: Secondary | ICD-10-CM | POA: Diagnosis not present

## 2024-03-22 ENCOUNTER — Encounter: Payer: Self-pay | Admitting: Hematology and Oncology

## 2024-04-11 ENCOUNTER — Other Ambulatory Visit: Payer: Self-pay | Admitting: Psychiatry

## 2024-04-11 DIAGNOSIS — F3342 Major depressive disorder, recurrent, in full remission: Secondary | ICD-10-CM

## 2024-04-11 NOTE — Telephone Encounter (Signed)
Call to RS

## 2024-04-15 NOTE — Telephone Encounter (Signed)
Scheduled 12/31

## 2024-06-15 ENCOUNTER — Encounter: Payer: Self-pay | Admitting: Psychiatry

## 2024-06-15 ENCOUNTER — Ambulatory Visit: Admitting: Psychiatry

## 2024-06-15 DIAGNOSIS — F411 Generalized anxiety disorder: Secondary | ICD-10-CM

## 2024-06-15 DIAGNOSIS — F3342 Major depressive disorder, recurrent, in full remission: Secondary | ICD-10-CM | POA: Diagnosis not present

## 2024-06-15 DIAGNOSIS — F900 Attention-deficit hyperactivity disorder, predominantly inattentive type: Secondary | ICD-10-CM | POA: Diagnosis not present

## 2024-06-15 DIAGNOSIS — F5105 Insomnia due to other mental disorder: Secondary | ICD-10-CM

## 2024-06-15 DIAGNOSIS — R251 Tremor, unspecified: Secondary | ICD-10-CM | POA: Diagnosis not present

## 2024-06-15 MED ORDER — CLONAZEPAM 1 MG PO TABS: 1.5000 mg | ORAL_TABLET | Freq: Every day | ORAL | 0 refills | Status: AC

## 2024-06-15 MED ORDER — BUPROPION HCL ER (XL) 300 MG PO TB24: 300.0000 mg | ORAL_TABLET | Freq: Every day | ORAL | 1 refills | Status: AC

## 2024-06-15 MED ORDER — LISDEXAMFETAMINE DIMESYLATE 40 MG PO CAPS: 40.0000 mg | ORAL_CAPSULE | Freq: Every day | ORAL | 0 refills | Status: DC

## 2024-06-15 NOTE — Progress Notes (Signed)
 [name] [id number] 1961-04-11 63 y.o.    Subjective:   Patient ID:  [name] is a 63 y.o. (DOB January 27, 1961) female.  Chief Complaint:  Chief Complaint  Patient presents with   Follow-up   Depression   Sleeping Problem   ADD    [name] presents to the office today for follow-up of depression and ADD.  seen August 23, 2018.  She had reduced the Abilify  to 5 mg and was doing well.  She wanted to try to taper off of that medication.   visit June 2020.  Reduced abilify  to 2 mg daily without any problems.   visit October 2020.  She was still doing well and she wanted to try discontinuing the Abilify  which was reasonable so it was stopped.  January 2021 appointment with the following noted: No meds were changed. Healthy.  No problem off the Abilify .  Depression is under control.  Very stressful last 2 weeks which created some anxiety.  Still working long hours. Stopped Vyvanse  bc didn't feel she needed it any longer about 3 weeks ago.  Tremor resolved.  No worse depression.  Other stressors have occurred related to the marriage.  Anxiety is still manageable.  Sleep 7 hours until worse last 2 weeks with work demands.  Sometimes goes to bed to late.  Work from home and a lot. 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  Mother living with her until July.   Sleep OK usually.  Diazepam  mainly used for sleep and it helps with Flexeril . Wants to eventually try reducing Wellbutrin  to see if tremor is better.  Dating and feels better.  January 04, 2020 appointment with the following noted: Still doing fine with mood.  Both depression and anxiety managed.  Wants to reduce Wellbutrin  to 150 mg daily. Diazepam  about once weekly for sleep.  Usually stress work related. No med changes  08/29/2020 appointment with the following noted: More depression when tapered off Wellbutrin .  More sadness and resolved back on the Wellbutrin  and not depressed now.   Started vitamin B6 which  looked Wellbutrin  and not sure if she accidentally missed Wellbutrin  for a week.  Will restart it. Managing chronic work stress. Not sleeping enough and doesn't think Valium  15 mg for 2 weeks is helping with EMA 4 AM with 7 hours good night.  Time change made it worse.  No SE.  Normal sleep 8 hours. Typical is 6 hours.  Work 8-10 hours daily. Down to 1 cup coffee am. Plan: Trial switch back to clonazepam1 mg HS for EMA  10/29/20 appt noted: Awaking 1-2 times per week but less EMA than before.  Tolerating it fine.  No handogover. Less tired daytime. 6-7 hours of sleep. Patient reports stable mood and denies depressed or irritable moods.  Patient denies any recent difficulty with anxiety.  Not anxious for awhile.. Denies appetite disturbance.  Patient reports that energy and motivation have been good.  Patient denies any difficulty with concentration.  Patient denies any suicidal ideation. No med changes  04/29/21 appt noted: Still on Wellbutrin  300 and clonazepam  1 mg HS. Doing fine with a lot of stress at work but last week should be end with new project.   Sleep fine with clonazepam  6-7 hours with no awakening.  No hangover effects. No SE. Patient reports stable mood and denies depressed or irritable moods.  Patient denies any recent difficulty with anxiety.  Patient denies difficulty with sleep initiation or maintenance. Denies appetite  disturbance.  Patient reports that energy and motivation have been good.  Patient denies any difficulty with concentration.  Patient denies any suicidal ideation. Stress eating working from home. 9 gkids 11yo to 63 yo Plan no med changes  03/05/2022 appointment noted: Still on Wellbutrin  300, clonazepam  1 mg HS and hydroxyzine  prn itching under stress. Doesn't know.why this happens itching in hands and bottom of feet.  Only once every few months. Still pleased with Wellbutrin .  Normal sadness for what she's going through.  Good relationship with  kids. Counselor. Satisfied with meds. Good function at work. No SE.  12/04/22 appt noted: Occ clonazepam  but needs 1/5 mg HS.   Still on Wellbutrin  300, prn clonazepam  1.5 mg HS and hydroxyzine  prn itching under stress. No sig dep but about to hit another major family crisis.  Continue s counseling. No SE problems unless tremor.  It seems worse.  Can interfere. 1 cup coffee per day. Does ot want to reduce Wellbutrin .    09/23/23 appt noted: Med: Wellbutrin  300, prn clonazepam  2 mg HS and hydroxyzine  prn itching under stress. No SE. Lower doses don't work of clonazepam .  Only takes every 3 day or so.  She will try to cut back in the future.  5-6 hours daily. Mood has been good other than concerns over family.  I'm ok with what I have with meds.  Not dep.   Plenty of work and function is good. Pending TKR bc hard to walk.  Sometimes sleep isssues DT knee pain. Plan no changes  06/15/24 appt noted:  Med: Wellbutrin  300, prn clonazepam  1- 2 mg HS and hydroxyzine  prn itching under stress. No SE. Still awakens but falls back to sleep.  As long as it is stable, I should be ok. No issues with Wellbutrin . Questions around ADHD meds bc distracted by so many things LT but worse.  Trouble finishing things and family notices.  D mentioned it to her.  Not finishing things at home.  Wonders if it gets worse with age.  Worse since 2010.  When younger couldn't follow instructions.  When in labs in school couldn't follow instructions.  Sister and B untreated ADD and Father and D Marcella.   Past Psychiatric Medication Trials:  Temazepam, clonazepam , Ambien no resp, Flexeril , diazepam  15 Lost resp, remote trazodone NR Abilify  10,  lithium, lamotrigine,  bupropion  450 tremor, Failed attempt to DC Wellbutrin .. buspirone 30 twice daily, venlafaxine hypomania, fluoxetine,  Adderall, Vyvanse  50 tremor but helped, [place],   Review of Systems:  Review of Systems  Constitutional:  Positive for appetite  change and unexpected weight change.  Musculoskeletal:  Positive for arthralgias.  Neurological:  Positive for tremors. Negative for weakness.  Psychiatric/Behavioral:  Negative for agitation, behavioral problems, confusion, decreased concentration, dysphoric mood, hallucinations, self-injury, sleep disturbance and suicidal ideas. The patient is not nervous/anxious and is not hyperactive.     Medications: I have reviewed the patient's current medications.  Current Outpatient Medications  Medication Sig Dispense Refill   Calcium-Vitamin D-Vitamin K (CHEWABLE CALCIUM PO) Take by mouth.     Cholecalciferol (VITAMIN D3 GUMMIES ADULT PO) Take by mouth.     hydrOXYzine  (ATARAX ) 10 MG tablet 1-2 tablets 3 times daily as needed for itching 100 tablet 0   Multiple Vitamin (MULTIVITAMIN) tablet Take 1 tablet by mouth daily.     buPROPion  (WELLBUTRIN  XL) 300 MG 24 hr tablet Take 1 tablet (300 mg total) by mouth daily. 90 tablet 1   clonazePAM  (KLONOPIN ) 1 MG  tablet Take 1.5 tablets (1.5 mg total) by mouth at bedtime. 135 tablet 0   lisdexamfetamine  (VYVANSE ) 40 MG capsule Take 1 capsule (40 mg total) by mouth daily. 30 capsule 0   No current facility-administered medications for this visit.    Medication Side Effects: None  Allergies:  Allergies  Allergen Reactions   Morphine  And Codeine Itching    Past Medical History:  Diagnosis Date   Anxiety    Asthma    as a child, early adult...none now   BRCA negative 06/2013   BRCA I & II negative   Breast cancer (HCC) 10/28/12   right breast   Colonic mass 07/28/2011   ascending colon and found a polyp 2.5 cm adenoma - hemicoloectomy   Depression    GERD (gastroesophageal reflux disease)    occas  at  night....twice a  month--otc meds   Recurrent upper respiratory infection (URI)    started 1/28.....getting better   S/P chemotherapy, time since greater than 12 weeks     Family History  Problem Relation Age of Onset   Heart disease Father     Cancer Paternal Aunt        unknown form of cancer   Lung cancer Maternal Grandmother    Testicular cancer Cousin        paternal cousin; died in 32s   Pancreatic cancer Cousin 54   Colon cancer Other        paternal grandmother's sister   Cancer Other        MGM's sister    Social History   Socioeconomic History   Marital status: Married    Spouse name: Delsa   Number of children: 2   Years of education: Not on file   Highest education level: Not on file  Occupational History    Employer: [employer]  Tobacco Use   Smoking status: Never   Smokeless tobacco: Never  Substance and Sexual Activity   Alcohol use: Yes    Comment: wine occasionally   Drug use: No   Sexual activity: Yes    Birth control/protection: Surgical  Other Topics Concern   Not on file  Social History Narrative   Not on file   Social Drivers of Health   Tobacco Use: Low Risk (06/15/2024)   Patient History    Smoking Tobacco Use: Never    Smokeless Tobacco Use: Never    Passive Exposure: Not on file  Financial Resource Strain: Not on file  Food Insecurity: Not on file  Transportation Needs: Not on file  Physical Activity: Not on file  Stress: Not on file  Social Connections: Not on file  Intimate Partner Violence: Not on file  Depression (EYV7-0): Not on file  Alcohol Screen: Not on file  Housing: Not on file  Utilities: Not on file  Health Literacy: Not on file    Past Medical History, Surgical history, Social history, and Family history were reviewed and updated as appropriate.   Please see review of systems for further details on the patient's review from today.   Objective:   Physical Exam:  There were no vitals taken for this visit.  Physical Exam Neurological:     Mental Status: She is alert and oriented to person, place, and time.     Cranial Nerves: No dysarthria.  Psychiatric:        Attention and Perception: Attention and perception normal.        Mood and Affect: Mood  normal. Mood is not anxious  or depressed.        Speech: Speech normal.        Behavior: Behavior is cooperative.        Thought Content: Thought content normal. Thought content is not paranoid or delusional. Thought content does not include homicidal or suicidal ideation. Thought content does not include suicidal plan.        Cognition and Memory: Cognition and memory normal.        Judgment: Judgment normal.     Comments: Insight intact Some stress with family but managing so far     Lab Review:     Component Value Date/Time   NA 140 03/04/2023 0802   NA 139 02/09/2017 0911   K 3.9 03/04/2023 0802   K 4.0 02/09/2017 0911   CL 108 03/04/2023 0802   CL 102 11/03/2012 0821   CO2 26 03/04/2023 0802   CO2 26 02/09/2017 0911   GLUCOSE 88 03/04/2023 0802   GLUCOSE 104 02/09/2017 0911   GLUCOSE 93 11/03/2012 0821   BUN 11 03/04/2023 0802   BUN 15.5 02/09/2017 0911   CREATININE 0.90 03/04/2023 0802   CREATININE 0.9 02/09/2017 0911   CALCIUM 9.2 03/04/2023 0802   CALCIUM 9.9 02/09/2017 0911   PROT 6.9 03/04/2023 0802   PROT 7.4 02/09/2017 0911   ALBUMIN 4.1 03/04/2023 0802   ALBUMIN 4.1 02/09/2017 0911   AST 14 (L) 03/04/2023 0802   AST 17 02/09/2017 0911   ALT 15 03/04/2023 0802   ALT 14 02/09/2017 0911   ALKPHOS 59 03/04/2023 0802   ALKPHOS 60 02/09/2017 0911   BILITOT 0.3 03/04/2023 0802   BILITOT 0.45 02/09/2017 0911   GFRNONAA >60 03/04/2023 0802   GFRAA >60 03/08/2020 0835       Component Value Date/Time   WBC 7.8 03/04/2023 0802   WBC 6.7 02/24/2019 0939   RBC 4.48 03/04/2023 0802   HGB 12.8 03/04/2023 0802   HGB 12.6 02/09/2017 0911   HCT 39.4 03/04/2023 0802   HCT 38.8 02/09/2017 0911   PLT 325 03/04/2023 0802   PLT 295 02/09/2017 0911   MCV 87.9 03/04/2023 0802   MCV 87.4 02/09/2017 0911   MCH 28.6 03/04/2023 0802   MCHC 32.5 03/04/2023 0802   RDW 13.4 03/04/2023 0802   RDW 14.2 02/09/2017 0911   LYMPHSABS 1.8 03/04/2023 0802   LYMPHSABS 1.6  02/09/2017 0911   MONOABS 0.6 03/04/2023 0802   MONOABS 0.5 02/09/2017 0911   EOSABS 0.2 03/04/2023 0802   EOSABS 0.1 02/09/2017 0911   BASOSABS 0.1 03/04/2023 0802   BASOSABS 0.1 02/09/2017 0911    No results found for: POCLITH, LITHIUM   No results found for: PHENYTOIN, PHENOBARB, VALPROATE, CBMZ   .res Assessment: Plan:    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  Failed attempt to DC Wellbutrin  therefore no incation to change.  Disc it could be contributing to tremor. Continue Wellbutrin  XL 300 mg AM.  She doesn't want to reduce.   Call if there is any recurrence of depression.  Disc timing of antidepressants and effects and SE.  Sleep hygiene discussed.  Sleep is worse with a lot of stress going on.  We discussed the short-term risks associated with benzodiazepines including sedation and increased fall risk among others.  Discussed long-term side effect risk including dependence, potential withdrawal symptoms, and the potential eventual dose-related risk of dementia.  But recent studies from 2020 dispute this association between benzodiazepines and dementia risk. Newer studies in 2020 do not support an association with dementia. Clonazepam  prn 2 mg HS for EMA.  Don't go higher.  Used only couple times per week. Option increase.  Sleep hygiene.  Discussed potential benefits, risks, and side effects of stimulants with patient to include increased heart rate, hypertension, palpitations, insomnia, increased anxiety, increased irritability, or decreased  appetite.  Instructed patient to contact office if experiencing any significant tolerability issues. Esp hx tremor from Vyvanse .  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  Disc tx options MPH, beta blocker.  Disc caffeine Resume Vyvanse  40 mg AM  She agrees to the plan  Follow-up  3 mos  Lorene Macintosh MD, DFAPA   Please see After Visit Summary for patient specific instructions.  No future appointments.   No orders of the defined types were placed in this encounter.     -------------------------------

## 2024-07-13 ENCOUNTER — Other Ambulatory Visit: Payer: Self-pay

## 2024-07-13 ENCOUNTER — Telehealth: Payer: Self-pay | Admitting: Psychiatry

## 2024-07-13 DIAGNOSIS — F900 Attention-deficit hyperactivity disorder, predominantly inattentive type: Secondary | ICD-10-CM

## 2024-07-13 MED ORDER — LISDEXAMFETAMINE DIMESYLATE 40 MG PO CAPS: 40.0000 mg | ORAL_CAPSULE | Freq: Every day | ORAL | 0 refills | Status: AC

## 2024-07-13 NOTE — Telephone Encounter (Signed)
 Pt called asking for a refill on her generic vyvanse  40 mg. Pharmacy is [facility] at [address]

## 2024-07-13 NOTE — Telephone Encounter (Signed)
Pended 2 RF.

## 2024-08-15 ENCOUNTER — Ambulatory Visit: Admitting: Psychiatry
